# Patient Record
Sex: Female | Born: 1980 | Race: White | Hispanic: No | Marital: Married | Smoking: Never smoker
Health system: Southern US, Community
[De-identification: ages and names within clinical notes are randomized; demographics above are authoritative.]

## PROBLEM LIST (undated history)

## (undated) DIAGNOSIS — K589 Irritable bowel syndrome without diarrhea: Secondary | ICD-10-CM

## (undated) DIAGNOSIS — N83209 Unspecified ovarian cyst, unspecified side: Secondary | ICD-10-CM

## (undated) DIAGNOSIS — R000 Tachycardia, unspecified: Secondary | ICD-10-CM

## (undated) DIAGNOSIS — K219 Gastro-esophageal reflux disease without esophagitis: Secondary | ICD-10-CM

## (undated) DIAGNOSIS — R0989 Other specified symptoms and signs involving the circulatory and respiratory systems: Secondary | ICD-10-CM

## (undated) DIAGNOSIS — R519 Headache, unspecified: Secondary | ICD-10-CM

## (undated) DIAGNOSIS — J302 Other seasonal allergic rhinitis: Secondary | ICD-10-CM

## (undated) DIAGNOSIS — J45909 Unspecified asthma, uncomplicated: Secondary | ICD-10-CM

## (undated) DIAGNOSIS — F419 Anxiety disorder, unspecified: Secondary | ICD-10-CM

## (undated) DIAGNOSIS — R112 Nausea with vomiting, unspecified: Secondary | ICD-10-CM

## (undated) DIAGNOSIS — R51 Headache: Secondary | ICD-10-CM

## (undated) DIAGNOSIS — F32A Depression, unspecified: Secondary | ICD-10-CM

## (undated) DIAGNOSIS — Z9889 Other specified postprocedural states: Secondary | ICD-10-CM

## (undated) DIAGNOSIS — K5900 Constipation, unspecified: Secondary | ICD-10-CM

## (undated) HISTORY — DX: Other specified symptoms and signs involving the circulatory and respiratory systems: R09.89

## (undated) HISTORY — DX: Constipation, unspecified: K59.00

## (undated) HISTORY — DX: Other seasonal allergic rhinitis: J30.2

## (undated) HISTORY — DX: Tachycardia, unspecified: R00.0

## (undated) HISTORY — DX: Headache: R51

## (undated) HISTORY — DX: Irritable bowel syndrome, unspecified: K58.9

## (undated) HISTORY — DX: Depression, unspecified: F32.A

## (undated) HISTORY — DX: Gastro-esophageal reflux disease without esophagitis: K21.9

## (undated) HISTORY — DX: Headache, unspecified: R51.9

---

## 2003-12-13 HISTORY — PX: DILATION AND CURETTAGE OF UTERUS: SHX78

## 2004-02-03 ENCOUNTER — Other Ambulatory Visit: Admission: RE | Admit: 2004-02-03 | Discharge: 2004-02-03 | Payer: Self-pay | Admitting: *Deleted

## 2004-06-09 ENCOUNTER — Ambulatory Visit (HOSPITAL_COMMUNITY): Admission: RE | Admit: 2004-06-09 | Discharge: 2004-06-09 | Payer: Self-pay | Admitting: Obstetrics and Gynecology

## 2004-06-29 ENCOUNTER — Observation Stay (HOSPITAL_COMMUNITY): Admission: AD | Admit: 2004-06-29 | Discharge: 2004-06-30 | Payer: Self-pay | Admitting: *Deleted

## 2004-09-05 ENCOUNTER — Inpatient Hospital Stay (HOSPITAL_COMMUNITY): Admission: AD | Admit: 2004-09-05 | Discharge: 2004-09-05 | Payer: Self-pay | Admitting: Obstetrics and Gynecology

## 2004-09-07 ENCOUNTER — Inpatient Hospital Stay (HOSPITAL_COMMUNITY): Admission: AD | Admit: 2004-09-07 | Discharge: 2004-09-10 | Payer: Self-pay | Admitting: Obstetrics and Gynecology

## 2004-11-03 ENCOUNTER — Other Ambulatory Visit: Admission: RE | Admit: 2004-11-03 | Discharge: 2004-11-03 | Payer: Self-pay | Admitting: Obstetrics & Gynecology

## 2006-09-13 ENCOUNTER — Ambulatory Visit (HOSPITAL_COMMUNITY): Admission: AD | Admit: 2006-09-13 | Discharge: 2006-09-13 | Payer: Self-pay | Admitting: Obstetrics & Gynecology

## 2006-09-13 ENCOUNTER — Encounter (INDEPENDENT_AMBULATORY_CARE_PROVIDER_SITE_OTHER): Payer: Self-pay | Admitting: Specialist

## 2010-04-19 ENCOUNTER — Inpatient Hospital Stay (HOSPITAL_COMMUNITY): Admission: AD | Admit: 2010-04-19 | Discharge: 2010-04-22 | Payer: Self-pay | Admitting: Obstetrics & Gynecology

## 2011-03-01 LAB — CBC
HCT: 29.6 % — ABNORMAL LOW (ref 36.0–46.0)
HCT: 34.9 % — ABNORMAL LOW (ref 36.0–46.0)
MCHC: 35.4 g/dL (ref 30.0–36.0)
MCHC: 35.9 g/dL (ref 30.0–36.0)
MCV: 92.6 fL (ref 78.0–100.0)
MCV: 93.5 fL (ref 78.0–100.0)
Platelets: 182 10*3/uL (ref 150–400)
Platelets: 220 10*3/uL (ref 150–400)
RDW: 14.1 % (ref 11.5–15.5)
WBC: 11.7 10*3/uL — ABNORMAL HIGH (ref 4.0–10.5)
WBC: 17.3 10*3/uL — ABNORMAL HIGH (ref 4.0–10.5)

## 2011-03-01 LAB — RH IMMUNE GLOB WKUP(>/=20WKS)(NOT WOMEN'S HOSP)

## 2011-03-01 LAB — RPR: RPR Ser Ql: NONREACTIVE

## 2011-04-29 NOTE — Discharge Summary (Signed)
NAME:  Danielle [name], Danielle [name]                         ACCOUNT NO.:  [id number]   MEDICAL RECORD NO.:  [id number]                   PATIENT TYPE:  INP   LOCATION:  9164                                 FACILITY:  [facility]   PHYSICIAN:  Juluis Mire, M.D.                DATE OF BIRTH:  [birth year]   DATE OF ADMISSION:  06/28/2004  DATE OF DISCHARGE:  06/29/2004                                 DISCHARGE SUMMARY   ADMISSION DIAGNOSIS:  Intrauterine pregnancy at 31-6/7 weeks with nausea,  vomiting, and abdominal pain.   DISCHARGE DIAGNOSIS:  Intrauterine pregnancy at 31-6/7 weeks with nausea,  vomiting, and abdominal pain with probable muscular strain.   HISTORY OF PRESENT ILLNESS:  For a complete history and physical, please see  written note.   HOSPITAL COURSE:  The patient was brought into the hospital.  Laboratories  were checked.  Her PIH panel was within normal limits.  She had normal liver  function tests.  Platelet count was 250,000, white blood cell count was  12,500, hemoglobin was 10.9.  Urinalysis was clear except for ketones.  She  had negative nitrites, negative leukocytes, and negative blood on the UA.  She was begun on IV hydration.  It was noted that she had not had a bowel  movement for a while.  Fleets enema was given with good results.  The  following day, an ultrasound was obtained.  She had no evidence of  gallstones.  The infant had a biophysical profile of 8/8.  There was no  evidence of renal stones.  Fetal growth pattern was in the 50 to 75th  percentile.  She was feeling much better that day.  She was afebrile with  stable vital signs.  Abdomen was soft.  It had some subumbilical tenderness,  worse when she tightened the muscles and she raised her head.  Her bowel  sounds were active.  Gravid uterus, consistent with dates.  Fetal heart  tones were 140s to 150s, reactive.  Cervix was long and closed.  It was felt  that she might of had a muscular strain with the nausea and  vomiting causing  the subumbilical pain, particularly since it was worse when she raises her  head.   In terms of complications, none were encountered during her stay in the  hospital.  The patient was discharged home in stable condition.   DISPOSITION:  Continue watching for increasing pain or signs of change at  home.   FOLLOWUP:  She will follow up in the office on Friday.  Again, call with any  increasing nausea or vomiting, abdominal pain, or signs of any pre-term  uterine activity.  Juluis Mire, M.D.    JSM/MEDQ  D:  06/30/2004  T:  06/30/2004  Job:  [id number]

## 2011-04-29 NOTE — Op Note (Signed)
[name], [name]                   ACCOUNT NO.:  [id number]   MEDICAL RECORD NO.:  [id number]          PATIENT TYPE:  AMB   LOCATION:  SDC                           [facility]ILITY:  [facility]   PHYSICIAN:  Freddy Finner, M.D.   DATE OF BIRTH:  1981/06/15   DATE OF PROCEDURE:  09/13/2006  DATE OF DISCHARGE:                                 OPERATIVE REPORT   PREOPERATIVE DIAGNOSIS:  Blighted ovum with missed abortion.   POSTOPERATIVE DIAGNOSIS:  Blighted ovum with missed abortion.   OPERATION:  Dilatation and evacuation.   SURGEON:  Freddy Finner, M.D.   ANESTHESIA:  IV sedation and paracervical block.   ESTIMATED INTRAOPERATIVE BLOOD LOSS:  50 cc.   INTRAOPERATIVE COMPLICATIONS:  None.   Patient is a 30 year old married white female, gravida 2, para 1, who  presented for evaluation on September 11, 2006 with a history of vaginal  bleeding and estimated gestational age of [redacted] weeks.  The sonogram showed a 6  week, 2 day size sac with no fetal pole.  Options were discussed with the  patient, who elected to follow serial HCGs with planned D&E on the October  4th.  She presented to the office today for preoperative evaluation  complaining of passage of dime-sized clots vaginally and some increasing  cramping and red bleeding.  Quantitative HCG had dramatically decreased.  Based on the findings and the onset of heavier bleeding, it was elected to  proceed with surgery today.  She was admitted on the afternoon of surgery.   She was brought to the operating room, there placed under IV sedation.  Placed in the dorsal lithotomy position using Allen stirrups system.  Betadine prep of the mons, perineum, and vagina was carried out in a  standard fashion.  A bivalve speculum was introduced.  The cervix was  visualized, grasped with a single-tooth tenaculum over the anterior cervical  lip.  Paracervical block was then placed with injections at 4 and 8 o'clock  in the vaginal fornices using 1% plain  Xylocaine.  The cervix was easily  dilated to 25 with [facility] dilators.  A 7 mm suction cannula was introduced.  Aspiration produced obvious products of conception.  This was continued  until it was felt the cavity was completely evacuated.  Gentle sharp  curettage was carried out, followed by repeat vacuum aspiration to confirm  complete evacuation of the products of conception.  Procedure at this point  was terminated.  The patient was given  Toradol 30 mg IV and 30 mg IM.  She was taken to recovery in good condition.  She will be given routine outpatient surgical instructions.  She is to  receive RhoGAM in the recovery room.  She is to return to the office in  approximately one week for followup.  She is instructed to use Tylenol or  Advil as needed for postoperative pain.      Freddy Finner, M.D.  Electronically Signed     WRN/MEDQ  D:  09/13/2006  T:  09/13/2006  Job:  [id number]

## 2012-09-18 ENCOUNTER — Encounter: Payer: Self-pay | Admitting: Cardiovascular Disease

## 2012-10-01 ENCOUNTER — Encounter (HOSPITAL_COMMUNITY): Payer: Self-pay | Admitting: *Deleted

## 2012-10-01 ENCOUNTER — Emergency Department (HOSPITAL_COMMUNITY)
Admission: EM | Admit: 2012-10-01 | Discharge: 2012-10-02 | Disposition: A | Discharge status: Home / Self Care | Payer: BC Managed Care – PPO | Attending: Emergency Medicine | Admitting: Emergency Medicine

## 2012-10-01 DIAGNOSIS — R109 Unspecified abdominal pain: Secondary | ICD-10-CM | POA: Insufficient documentation

## 2012-10-01 DIAGNOSIS — N83209 Unspecified ovarian cyst, unspecified side: Secondary | ICD-10-CM | POA: Insufficient documentation

## 2012-10-01 DIAGNOSIS — R110 Nausea: Secondary | ICD-10-CM | POA: Insufficient documentation

## 2012-10-01 DIAGNOSIS — D72829 Elevated white blood cell count, unspecified: Secondary | ICD-10-CM | POA: Insufficient documentation

## 2012-10-01 DIAGNOSIS — J45909 Unspecified asthma, uncomplicated: Secondary | ICD-10-CM | POA: Insufficient documentation

## 2012-10-01 HISTORY — DX: Unspecified asthma, uncomplicated: J45.909

## 2012-10-01 HISTORY — DX: Unspecified ovarian cyst, unspecified side: N83.209

## 2012-10-01 NOTE — ED Notes (Addendum)
Pt c/o of lower abd pain that comes "in waves" since 1830 tonight.  She has a hx of ovarian cyst rupture and this feels the same.  Denies vag discharge or urinary s/s.  Pt experiences nausea with the pain.  Pt also c/o very irregular periods.

## 2012-10-02 ENCOUNTER — Emergency Department (HOSPITAL_COMMUNITY): Payer: BC Managed Care – PPO

## 2012-10-02 LAB — COMPREHENSIVE METABOLIC PANEL
ALT: 10 U/L (ref 0–35)
AST: 17 U/L (ref 0–37)
Albumin: 4.2 g/dL (ref 3.5–5.2)
Alkaline Phosphatase: 66 U/L (ref 39–117)
Calcium: 9.8 mg/dL (ref 8.4–10.5)
Chloride: 102 mEq/L (ref 96–112)
GFR calc non Af Amer: >90 mL/min (ref >90)
Sodium: 139 mEq/L (ref 135–145)
Total Bilirubin: 0.5 mg/dL (ref 0.3–1.2)
Total Protein: 7.7 g/dL (ref 6.0–8.3)

## 2012-10-02 LAB — URINALYSIS, ROUTINE W REFLEX MICROSCOPIC
Bilirubin Urine: NEGATIVE
Ketones, ur: NEGATIVE mg/dL
Nitrite: NEGATIVE
Protein, ur: NEGATIVE mg/dL
Specific Gravity, Urine: 1.013 (ref 1.005–1.030)
pH: 8.5 — ABNORMAL HIGH (ref 5.0–8.0)

## 2012-10-02 LAB — CBC
HCT: 38.5 % (ref 36.0–46.0)
RBC: 4.51 MIL/uL (ref 3.87–5.11)

## 2012-10-02 LAB — PREGNANCY, URINE: Preg Test, Ur: NEGATIVE

## 2012-10-02 MED ORDER — ONDANSETRON HCL 4 MG/2ML IJ SOLN
4.0000 mg | Freq: Once | INTRAMUSCULAR | Status: AC
  Administered 2012-10-02: 4 mg via INTRAVENOUS
  Filled 2012-10-02: qty 2

## 2012-10-02 MED ORDER — MORPHINE SULFATE 4 MG/ML IJ SOLN
4.0000 mg | Freq: Once | INTRAMUSCULAR | Status: AC
  Administered 2012-10-02: 4 mg via INTRAVENOUS
  Filled 2012-10-02: qty 1

## 2012-10-02 MED ORDER — HYDROCODONE-ACETAMINOPHEN 5-500 MG PO TABS: 1.0000 | ORAL_TABLET | Freq: Four times a day (QID) | ORAL | Status: DC | PRN

## 2012-10-02 MED ORDER — SODIUM CHLORIDE 0.9 % IV BOLUS (SEPSIS)
1000.0000 mL | Freq: Once | INTRAVENOUS | Status: AC
  Administered 2012-10-02: 1000 mL via INTRAVENOUS

## 2012-10-02 MED ORDER — CIPROFLOXACIN HCL 500 MG PO TABS: 500.0000 mg | ORAL_TABLET | Freq: Two times a day (BID) | ORAL | Status: DC

## 2012-10-02 NOTE — ED Provider Notes (Addendum)
History     CSN: [id number]  Arrival date & time 10/01/12  2347   First MD Initiated Contact with Patient 10/01/12 2353      Chief Complaint  Patient presents with  . Abdominal Pain    (Consider location/radiation/quality/duration/timing/severity/associated sxs/prior treatment) Patient is a 31 y.o. female presenting with abdominal pain. The history is provided by the patient.  Abdominal Pain The primary symptoms of the illness include abdominal pain. The current episode started 1 to 2 hours ago. The onset of the illness was sudden. The problem has not changed since onset. The abdominal pain began less than 1 hour ago. The pain came on suddenly. The abdominal pain has been rapidly improving since its onset. The abdominal pain is located in the suprapubic region. The severity of the abdominal pain is 3/10. The abdominal pain is relieved by nothing. The abdominal pain is exacerbated by vomiting.  The patient states that she believes she is currently not pregnant. The patient has not had a change in bowel habit. Symptoms associated with the illness do not include chills or anorexia.    Past Medical History  Diagnosis Date  . Other and unspecified ovarian cysts   . Asthma     Past Surgical History  Procedure Date  . Dilation and curettage of uterus 2005    No family history on file.  History  Substance Use Topics  . Smoking status: Never Smoker   . Smokeless tobacco: Not on file  . Alcohol Use: Yes     occasionally    OB History    Grav Para Term Preterm Abortions TAB SAB Ect Mult Living                  Review of Systems  Constitutional: Negative for chills.  Gastrointestinal: Positive for abdominal pain. Negative for anorexia.  All other systems reviewed and are negative.    Allergies  Review of patient's allergies indicates no known allergies.  Home Medications   Current Outpatient Rx  Name Route Sig Dispense Refill  . ALBUTEROL SULFATE HFA 108 (90 BASE)  MCG/ACT IN AERS Inhalation Inhale 2 puffs into the lungs every 6 (six) hours as needed. For wheeze or shortness of breath      BP 143/103  Pulse 76  Temp 98.6 F (37 C) (Oral)  Resp 18  SpO2 99%  LMP 09/18/2012  Physical Exam  Constitutional: She is oriented to person, place, and time. She appears well-developed and well-nourished.  HENT:  Head: Normocephalic and atraumatic.  Eyes: Conjunctivae normal and EOM are normal. Pupils are equal, round, and reactive to light.  Neck: Normal range of motion.  Cardiovascular: Normal rate, regular rhythm and normal heart sounds.   Pulmonary/Chest: Effort normal and breath sounds normal.  Abdominal: Soft. Bowel sounds are normal. There is tenderness.    Musculoskeletal: Normal range of motion.  Neurological: She is alert and oriented to person, place, and time.  Skin: Skin is warm and dry.  Psychiatric: She has a normal mood and affect. Her behavior is normal.    ED Course  Procedures (including critical care time)  Labs Reviewed - No data to display No results found.   No diagnosis found.    MDM  + lower abd pain.  States hx of ovarian cyst.  Will [place],  reassess  [place] neg except for small corpus luteal cyst.  Abd pain resolved on reexam.  Pt improved.  Discussed other possible etiology of pain with pt.  Declines ct  at this time.  Minimal leukocytosis.  No rlq pain.  Will dc with analgesia, abx, ret new/worsening sxs       Giovonnie Trettel Lytle Michaels, MD 10/02/12 0008  Denissa Cozart Lytle Michaels, MD 10/02/12 4540  Knox Royalty Lytle Michaels, MD 10/02/12 9811

## 2012-10-11 ENCOUNTER — Encounter: Payer: Self-pay | Admitting: Cardiovascular Disease

## 2012-10-11 ENCOUNTER — Ambulatory Visit (INDEPENDENT_AMBULATORY_CARE_PROVIDER_SITE_OTHER): Payer: BC Managed Care – PPO | Admitting: Cardiovascular Disease

## 2012-10-11 VITALS — BP 117/70 | HR 68 | Ht 64.0 in | Wt 140.0 lb

## 2012-10-11 DIAGNOSIS — R002 Palpitations: Secondary | ICD-10-CM

## 2012-10-11 NOTE — Patient Instructions (Addendum)

## 2012-10-11 NOTE — Progress Notes (Signed)
   History of Present Illness: 31 yo WF with history of asthma, palpitations, vasovagal syncope who is here today for evaluation of palpitations. She has had some episodes of syncope but his is mostly related to needle sticks and other events. No syncope during episodes of palpitations. Palpitations last for several minutes. This occurs every few months. Going on for years. Works in [employer] as Engineer, civil (consulting). HR was 130 one time but she did not get an EKG. No chest pain or SOB.   Primary Care Physician:  Zelphia Cairo, MD (OB/Gyn)   Past Medical History  Diagnosis Date  . Other and unspecified ovarian cysts   . Asthma   . Rapid heart rate   . Pulse irregularity   . Generalized headaches   . Seasonal allergies   . Constipation     Past Surgical History  Procedure Date  . Dilation and curettage of uterus 2005    Current Outpatient Prescriptions  Medication Sig Dispense Refill  . albuterol (PROVENTIL HFA;VENTOLIN HFA) 108 (90 BASE) MCG/ACT inhaler Inhale 2 puffs into the lungs every 6 (six) hours as needed. For wheeze or shortness of breath      . ciprofloxacin (CIPRO) 500 MG tablet Take 1 tablet (500 mg total) by mouth every 12 (twelve) hours.  10 tablet  0  . HYDROcodone-acetaminophen (VICODIN) 5-500 MG per tablet Take 1-2 tablets by mouth every 6 (six) hours as needed for pain.  15 tablet  0    No Known Allergies  History   Social History  . Marital Status: Married    Spouse Name: N/A    Number of Children: 2  . Years of Education: N/A   Occupational History  . 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 Smoking status: Never Smoker   . Smokeless tobacco: Not on file  . Alcohol Use: 0.5 oz/week    1 drink(s) per week     occasionally  . Drug Use: No  . Sexually Active: Not on file   Other Topics Concern  . Not on file   Social History Narrative  . No narrative on file    Family History  Problem Relation Age of Onset  . CAD Neg Hx   .  Hypertension Mother   . Hypertension Father     Review of Systems:  As stated in the HPI and otherwise negative.   BP 117/70  Pulse 68  Ht 5\' 4"  (1.626 m)  Wt 140 lb (63.504 kg)  BMI 24.03 kg/m2  LMP 09/18/2012  Physical Examination: General: Well developed, well nourished, NAD HEENT: OP clear, mucus membranes moist SKIN: warm, dry. No rashes. Neuro: No focal deficits Musculoskeletal: Muscle strength 5/5 all ext Psychiatric: Mood and affect normal Neck: No JVD, no carotid bruits, no thyromegaly, no lymphadenopathy. Lungs:Clear bilaterally, no wheezes, rhonci, crackles Cardiovascular: Regular rate and rhythm. No murmurs, gallops or rubs. Abdomen:Soft. Bowel sounds present. Non-tender.  Extremities: No lower extremity edema. Pulses are 2 + in the bilateral DP/PT.  EKG: NSR with sinus arrythmia. Rate 68 bpm.   Assessment and Plan:   1. Palpitations: Rare. Likely premature beats or SVT. They do not occur frequently, maybe 1 time every 2 months. Will arrange echo to exclude structural heart disease. I will not arrange monitor since her symptoms are sporadic. She is asked to avoid stimulants such as caffeine/nicotine/OTC meds with stimulant additives.

## 2012-10-24 ENCOUNTER — Other Ambulatory Visit (HOSPITAL_COMMUNITY): Payer: BC Managed Care – PPO

## 2012-12-19 ENCOUNTER — Ambulatory Visit (HOSPITAL_COMMUNITY): Payer: BC Managed Care – PPO | Attending: Cardiovascular Disease | Admitting: Radiology

## 2012-12-19 ENCOUNTER — Other Ambulatory Visit (HOSPITAL_COMMUNITY): Payer: BC Managed Care – PPO

## 2012-12-19 DIAGNOSIS — R002 Palpitations: Secondary | ICD-10-CM

## 2012-12-19 DIAGNOSIS — R55 Syncope and collapse: Secondary | ICD-10-CM | POA: Insufficient documentation

## 2012-12-19 NOTE — Progress Notes (Signed)
Echocardiogram performed.  

## 2014-07-17 ENCOUNTER — Other Ambulatory Visit: Payer: Self-pay | Admitting: Obstetrics and Gynecology

## 2014-07-17 DIAGNOSIS — N644 Mastodynia: Secondary | ICD-10-CM

## 2014-07-23 ENCOUNTER — Ambulatory Visit
Admission: RE | Admit: 2014-07-23 | Discharge: 2014-07-23 | Disposition: A | Discharge status: Home / Self Care | Payer: BC Managed Care – PPO | Source: Ambulatory Visit | Attending: Obstetrics and Gynecology | Admitting: Obstetrics and Gynecology

## 2014-07-23 ENCOUNTER — Encounter (INDEPENDENT_AMBULATORY_CARE_PROVIDER_SITE_OTHER): Payer: Self-pay

## 2014-07-23 DIAGNOSIS — N644 Mastodynia: Secondary | ICD-10-CM

## 2014-10-13 ENCOUNTER — Other Ambulatory Visit: Payer: Self-pay | Admitting: Obstetrics and Gynecology

## 2014-10-14 LAB — CYTOLOGY - PAP

## 2016-04-15 ENCOUNTER — Ambulatory Visit (INDEPENDENT_AMBULATORY_CARE_PROVIDER_SITE_OTHER): Payer: BLUE CROSS/BLUE SHIELD | Admitting: Family Medicine

## 2016-04-15 ENCOUNTER — Encounter: Payer: Self-pay | Admitting: Family Medicine

## 2016-04-15 VITALS — BP 124/82 | HR 88 | Temp 98.5°F | Resp 16 | Ht 64.0 in | Wt 152.0 lb

## 2016-04-15 DIAGNOSIS — F411 Generalized anxiety disorder: Secondary | ICD-10-CM | POA: Diagnosis not present

## 2016-04-15 DIAGNOSIS — F4001 Agoraphobia with panic disorder: Secondary | ICD-10-CM

## 2016-04-15 DIAGNOSIS — J309 Allergic rhinitis, unspecified: Secondary | ICD-10-CM | POA: Diagnosis not present

## 2016-04-15 DIAGNOSIS — H1032 Unspecified acute conjunctivitis, left eye: Secondary | ICD-10-CM | POA: Diagnosis not present

## 2016-04-15 MED ORDER — MONTELUKAST SODIUM 10 MG PO TABS: 10.0000 mg | ORAL_TABLET | Freq: Every day | ORAL | Status: DC

## 2016-04-15 MED ORDER — ESCITALOPRAM OXALATE 20 MG PO TABS: 20.0000 mg | ORAL_TABLET | Freq: Every day | ORAL | Status: DC

## 2016-04-15 MED ORDER — OFLOXACIN 0.3 % OP SOLN: 1.0000 [drp] | Freq: Four times a day (QID) | OPHTHALMIC | Status: AC

## 2016-04-15 NOTE — Patient Instructions (Signed)
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  2. Generalized anxiety disorder Well controlled. - escitalopram (LEXAPRO) 20 MG tablet; Take 1 tablet (20 mg total) by mouth daily.  Dispense: 90 tablet; Refill: 2  3. Acute conjunctivitis, left eye abx x 7 days - ofloxacin (OCUFLOX) 0.3 % ophthalmic solution; Place 1 drop into the left eye 4 (four) times daily.  Dispense: 5 mL; Refill: 0  4. Allergic rhinitis, unspecified allergic rhinitis type Stopped Flonase.  - montelukast (SINGULAIR) 10 MG tablet; Take 1 tablet (10 mg total) by mouth at bedtime.  Dispense: 30 tablet; Refill: 3      If you sign-up for My chart, you can communicate easier with [place] in case you have any question or concern.

## 2016-04-15 NOTE — Progress Notes (Signed)
HPI:  [name] is a 35 y.o.female , who is here today to establish care with [place] her, I have seen her while I was with [facility] and she decided to follow me here.  She follows with gyn for her routine female preventive care.  Hx of anxiety,allergic rhinitis, and asthma.  Has the following chronic problems that require follow up and concerns today:   She has history of anxiety disorder and agoraphobia, the former one she has had for years, mild and untreated.She developed a sudden episode of panic attack in December 2015, since then she has been on clonazepam. Currently he is on Lexapro 20 mg , which was started in 01/2016, she was on Fluoxetine and Sertraline before. She is still on Clonazepam as needed, mainly when she travels.  She is also following psychotherapist every month, and had an appointment already scheduled with psychiatrists. In general she feels like her anxiety is better controlled, she has been able to travel with no major symptoms of agoraphobia. She has been on the beach when she didn't need to take her clonazepam as she did before. She also travelled to [place] the beginning of Spring and did "really well."  She denies any depressed mood and suicidal thoughts.  Left eye conjunctivitis: Today she is complaining of leg conjunctivae erythema and pruritus, today when she woke up she had yellowish discharge. She denies any eye pain or visual changes, headache, nausea, vomiting. She had left eye drops from last year, son's Rx, wonders if she can use it.  She has history of allergic rhinitis, currently she is on Zyrtec 10 mg daily and Flonase nasal spray, she has noted some nasal bleeding. She has not had any fever or chills. No sick contact. She has history of asthma but has not had any cough or wheezing.    REVIEW OF SYSTEMS:  General: Negative for fever, fatigue, abnormal weight loss, or changes in appetite.  Eyes: + conjunctival erythema and  pruritus + discharge. No visual changes.  ENT: Negative for earache, hearing loss, or ear drainage. + rhinorrhea, nasal congestion, nose pruritus, and mild epistaxis.  No sinus pain,  Negative for oral lesions, odynophagia, dysphagia.  Neck: negative for swollen glands or masses.  Cardiac: Negative for chest pain, exertional dyspnea, irregular HR, claudication, cold extremities, or edema.  Respiratory: Negative for cough, dyspnea, wheezing, or hemoptysis.  Abdomen: Negative for abdominal pain, changes in bowel habits, blood in stool or melena, nausea, or vomiting.  GU: Negative for dysuria, urinary frequency, urinary urgency, gross hematuria, urine incontinence.  MS: No arthralgias, joint erythema or edema, joint stiffness, or limitation of ROM.  Neurologic: No confusion,  focal weakness, numbness or tingling, frequent/severe headaches, or tremor.  Psychiatric: No depression or sleep disorder. + Anxiety, better controlled with medication. Denies suicidal thoughts or hallucinations.  Skin: Negative for rash, ulcers, or skin color changes.    Past Medical History  Diagnosis Date  . Other and unspecified ovarian cysts   . Asthma   . Rapid heart rate   . Pulse irregularity   . Generalized headaches   . Seasonal allergies   . Constipation     Past Surgical History  Procedure Laterality Date  . Dilation and curettage of uterus  2005    Family History  Problem Relation Age of Onset  . CAD Neg Hx   . Hypertension Mother   . Hypertension Father     Social History   Social History  .  Marital Status: Married    Spouse Name: N/A  . Number of Children: 2  . Years of Education: N/A   Occupational History  . 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 Smoking status: Never Smoker   . Smokeless tobacco: None  . Alcohol Use: 0.5 oz/week    1 drink(s) per week     Comment: occasionally  . Drug Use: No  . Sexual Activity: Not Asked   Other Topics  Concern  . None   Social History Narrative    Current Outpatient Prescriptions on File Prior to Visit  Medication Sig Dispense Refill  . albuterol (PROVENTIL HFA;VENTOLIN HFA) 108 (90 BASE) MCG/ACT inhaler Inhale 2 puffs into the lungs every 6 (six) hours as needed. For wheeze or shortness of breath     No current facility-administered medications on file prior to visit.     EXAM:  Filed Vitals:   04/15/16 0959  BP: 124/82  Pulse: 88  Temp: 98.5 F (36.9 C)  Resp: 16   SpO2 Readings from Last 3 Encounters:  04/15/16 98%  10/02/12 98%     Body mass index is 26.08 kg/(m^2).    GENERAL: vitals reviewed and listed above. Well developed and nourished, appears well hydrated and in no acute distress.  HEAD: atraumatic.  EYE: right conjunttiva clear, left conjunctival erythema tarsal > bulbar, no discharge, + epiphora, PERRLA and EOMI bilateral.   [id number] mucosa most, no oral lesions, no oropharyngeal erythema, + post nasal drainage. Mild deviated septum, no sinus tenderness, mild hypertrophic turbinated, no blood appreciated, left septum/nostril dry/crusty. No obvious abnormalities on inspection of external nose and ears.  NECK: no obvious masses on inspection. No lymphadenopathy.  LUNGS: clear to auscultation bilaterally, no wheezes, rales or rhonchi, good air movement  CV: Regular rate and rhythm, no murmurs appreciated, no peripheral edema.  ABDOMEN: Soft, no tender, no masses or hepatomegaly appreciated.  MS: moves all extremities without noticeable abnormality.  NEURO: Alert and oriented x 3, no focal deficit appreciated, normal gait.  PSYCH: pleasant and cooperative, no obvious depression or anxiety. Well groomed and good eye contact.    ASSESSMENT AND PLAN:  Discussed the following assessment and plan:   Agoraphobia with panic attacks Stable. Continue following with psychotherapy. No changes in current management: Clonazepan prn and Lexapro. F/U in  6 months.   Generalized anxiety disorder  Well control with Lexapro, no changes today.  She will keep appointment with psychiatrist, she arranged this appointment in 02/2016. She would like to keep the appointment but thinking about continuing following with me. She is very glad about symptoms improvement with Lexapro. She will continue following with psychotherapy. She can follow up in 5-6 months here, before if needed.  - Plan: escitalopram (LEXAPRO) 20 MG tablet   Acute conjunctivitis, left eye   Possible causes discussed, certainly can be allergy, but I am treating as bacterial conjunctivitis given the sudden onset + history and clinical findings.  Instructed about warning signs. Follow-up as needed.   - Plan: ofloxacin (OCUFLOX) 0.3 % ophthalmic solution   Allergic rhinitis, unspecified allergic rhinitis type  Still symptomatic, epistaxis can be caused by rhinitis and Flonase, she will discontinue Flonase nasal spray and and Singulair added. She will continue Zyrtec. Nasal saline drops and steam inhalations might also help. Follow-up in 6 months.  - Plan: montelukast (SINGULAIR) 10 MG tablet    -We reviewed the PMH, PSH, FH, SH, Meds and Allergies. -We provided refills for  any medications we will prescribe as needed. -We addressed current concerns per orders and patient instructions.     -Patient advised to return or notify a doctor immediately if symptoms worsen or persist or new concerns arise.      Betty G. Martinique, MD  [facility]. [place] office.

## 2016-05-13 ENCOUNTER — Ambulatory Visit (INDEPENDENT_AMBULATORY_CARE_PROVIDER_SITE_OTHER): Payer: BLUE CROSS/BLUE SHIELD | Admitting: Emergency Medicine

## 2016-05-13 ENCOUNTER — Ambulatory Visit (INDEPENDENT_AMBULATORY_CARE_PROVIDER_SITE_OTHER): Payer: BLUE CROSS/BLUE SHIELD

## 2016-05-13 VITALS — BP 118/78 | HR 91 | Temp 98.3°F | Resp 15 | Ht 64.0 in | Wt 155.0 lb

## 2016-05-13 DIAGNOSIS — J301 Allergic rhinitis due to pollen: Secondary | ICD-10-CM

## 2016-05-13 DIAGNOSIS — Z1388 Encounter for screening for disorder due to exposure to contaminants: Secondary | ICD-10-CM

## 2016-05-13 DIAGNOSIS — J4531 Mild persistent asthma with (acute) exacerbation: Secondary | ICD-10-CM | POA: Diagnosis not present

## 2016-05-13 DIAGNOSIS — M25571 Pain in right ankle and joints of right foot: Secondary | ICD-10-CM

## 2016-05-13 DIAGNOSIS — Z3202 Encounter for pregnancy test, result negative: Secondary | ICD-10-CM

## 2016-05-13 DIAGNOSIS — M8430XA Stress fracture, unspecified site, initial encounter for fracture: Secondary | ICD-10-CM

## 2016-05-13 DIAGNOSIS — J45901 Unspecified asthma with (acute) exacerbation: Secondary | ICD-10-CM | POA: Insufficient documentation

## 2016-05-13 DIAGNOSIS — R062 Wheezing: Secondary | ICD-10-CM | POA: Diagnosis not present

## 2016-05-13 DIAGNOSIS — J309 Allergic rhinitis, unspecified: Secondary | ICD-10-CM | POA: Insufficient documentation

## 2016-05-13 DIAGNOSIS — J4520 Mild intermittent asthma, uncomplicated: Secondary | ICD-10-CM | POA: Insufficient documentation

## 2016-05-13 LAB — POCT URINE PREGNANCY: PREG TEST UR: NEGATIVE

## 2016-05-13 MED ORDER — BECLOMETHASONE DIPROPIONATE 80 MCG/ACT IN AERS: 2.0000 | INHALATION_SPRAY | Freq: Two times a day (BID) | RESPIRATORY_TRACT | Status: DC

## 2016-05-13 MED ORDER — ALBUTEROL SULFATE (2.5 MG/3ML) 0.083% IN NEBU
2.5000 mg | INHALATION_SOLUTION | Freq: Once | RESPIRATORY_TRACT | Status: AC
  Administered 2016-05-13: 2.5 mg via RESPIRATORY_TRACT

## 2016-05-13 NOTE — Patient Instructions (Addendum)
IF you received an x-ray today, you will receive an invoice from [facility]. Please contact [facility] at [phone] with questions or concerns regarding your invoice.   IF you received labwork today, you will receive an invoice from [employer]. Please contact [facility] at [phone] with questions or concerns regarding your invoice.   Our billing staff will not be able to assist you with questions regarding bills from these companies.  You will be contacted with the lab results as soon as they are available. The fastest way to get your results is to activate your My Chart account. Instructions are located on the last page of this paperwork. If you have not heard from [place] regarding the results in 2 weeks, please contact this office.     Stress Fracture Stress fracture is a small break or crack in a bone. A stress fracture can be fully broken (complete) or partially broken (incomplete). The most common sites for stress fractures are the bones in the front of your feet (metatarsals), your heels (calcaneus), and the long bone of your lower leg (tibia). CAUSES A stress fracture is caused by overuse or repetitive exercise, such as running. It happens when a bone cannot absorb any more shock because the muscles around it are weak. Stress fractures happen most commonly when:  You rapidly increase or start a new physical activity.  You use shoes that are worn out or do not fit you properly.  You exercise on a new surface. RISK FACTORS You may be at higher risk for this type of fracture if:  You have a condition that causes weak bones (osteoporosis).  You are female. Stress fractures are more likely to occur in women. SIGNS AND SYMPTOMS The most common symptom of a stress fracture is feeling pain when you are using the affected part of your body. The pain usually goes away when you are resting. Other symptoms may include:  Swelling of the  affected area.  Pain in the area when it is touched.  Decreased pain while resting. Stress fracture pain usually develops over time. DIAGNOSIS Diagnosis may include:   Medical history and physical exam.  X-rays.  Bone scan.  MRI. TREATMENT Treatment depends on the severity of your stress fracture. Treatment usually involves resting, icing, compression, and elevation (RICE) of the affected part of your body. Treatment may also include:  Medicines to reduce inflammation.  A cast or a walking shoe.  Crutches.  Surgery. HOME CARE INSTRUCTIONS If You Have a Cast:  Do not stick anything inside the cast to scratch your skin. Doing that increases your risk of infection.  Check the skin around the cast every day. Report any concerns to your health care provider. You may put lotion on dry skin around the edges of the cast. Do not apply lotion to the skin underneath the cast.  Keep the cast clean and dry.  Cover the cast with a watertight plastic bag to protect it from water while you take a bath or a shower. Do not let the cast get wet.  Do not put pressure on any part of the cast until it is fully hardened. This may take several hours. If You Have a Walking Shoe:  Wear it as directed by your health care provider. Managing Pain, Stiffness, and Swelling  If directed, apply ice to the injured area:  Put ice in a plastic bag.  Place a towel between your skin and the bag.  Leave the  ice on for 20 minutes, 2-3 times per day.  Move your fingers or toes often to avoid stiffness and to lessen swelling.  Raise the injured area above the level of your heart while you are sitting or lying down. Activity  Rest as directed by your health care provider. Ask your health care provider if you may do alternative exercises, such as swimming or biking, while you are healing.  Return to your normal activities as directed by your health care provider. Ask your health care provider what  activities are safe for you.  Perform range-of-motion exercises only as directed by your health care provider. Safety  Do not use the injured limb to support yourbody weight until your health care provider says that you can. Use crutches if your health care provider tells you to do so. General Instructions  Do not use any tobacco products, including cigarettes, chewing tobacco, or electronic cigarettes. Tobacco can delay bone healing. If you need help quitting, ask your health care provider.  Take medicines only as directed by your health care provider.  Keep all follow-up visits as directed by your health care provider. This is important. PREVENTION  Only wear shoes that:  Fit well.  Are not worn out.  Eat a healthy diet that contains vitamin D and calcium. This helps keeps your bones strong.  Be careful when you start a new physical activity. Give your body time to adjust.  Avoid doing only one kind of activity. Do different exercises, such as swimming and running, so that no single part of your body gets overused.  Do strength-training exercises. SEEK MEDICAL CARE IF:  Your pain gets worse.  You have new symptoms.  You have increased swelling. SEEK IMMEDIATE MEDICAL CARE IF:   You lose feeling in the affected area.   This information is not intended to replace advice given to you by your health care provider. Make sure you discuss any questions you have with your health care provider.   Document Released: 02/18/2003 Document Revised: 12/19/2014 Document Reviewed: 07/03/2014 Elsevier Interactive Patient Education 2016 [employer]. Asthma, Adult Asthma is a recurring condition in which the airways tighten and narrow. Asthma can make it difficult to breathe. It can cause coughing, wheezing, and shortness of breath. Asthma episodes, also called asthma attacks, range from minor to life-threatening. Asthma cannot be cured, but medicines and lifestyle changes can help  control it. CAUSES Asthma is believed to be caused by inherited (genetic) and environmental factors, but its exact cause is unknown. Asthma may be triggered by allergens, lung infections, or irritants in the air. Asthma triggers are different for each person. Common triggers include:   Animal dander.  Dust mites.  Cockroaches.  Pollen from trees or grass.  Mold.  Smoke.  Air pollutants such as dust, household cleaners, hair sprays, aerosol sprays, paint fumes, strong chemicals, or strong odors.  Cold air, weather changes, and winds (which increase molds and pollens in the air).  Strong emotional expressions such as crying or laughing hard.  Stress.  Certain medicines (such as aspirin) or types of drugs (such as beta-blockers).  Sulfites in foods and drinks. Foods and drinks that may contain sulfites include dried fruit, potato chips, and sparkling grape juice.  Infections or inflammatory conditions such as the flu, a cold, or an inflammation of the nasal membranes (rhinitis).  Gastroesophageal reflux disease (GERD).  Exercise or strenuous activity. SYMPTOMS Symptoms may occur immediately after asthma is triggered or many hours later. Symptoms include:  Wheezing.  Excessive nighttime or early morning coughing.  Frequent or severe coughing with a common cold.  Chest tightness.  Shortness of breath. DIAGNOSIS  The diagnosis of asthma is made by a review of your medical history and a physical exam. Tests may also be performed. These may include:  Lung function studies. These tests show how much air you breathe in and out.  Allergy tests.  Imaging tests such as X-rays. TREATMENT  Asthma cannot be cured, but it can usually be controlled. Treatment involves identifying and avoiding your asthma triggers. It also involves medicines. There are 2 classes of medicine used for asthma treatment:   Controller medicines. These prevent asthma symptoms from occurring. They are  usually taken every day.  Reliever or rescue medicines. These quickly relieve asthma symptoms. They are used as needed and provide short-term relief. Your health care provider will help you create an asthma action plan. An asthma action plan is a written plan for managing and treating your asthma attacks. It includes a list of your asthma triggers and how they may be avoided. It also includes information on when medicines should be taken and when their dosage should be changed. An action plan may also involve the use of a device called a peak flow meter. A peak flow meter measures how well the lungs are working. It helps you monitor your condition. HOME CARE INSTRUCTIONS   Take medicines only as directed by your health care provider. Speak with your health care provider if you have questions about how or when to take the medicines.  Use a peak flow meter as directed by your health care provider. Record and keep track of readings.  Understand and use the action plan to help minimize or stop an asthma attack without needing to seek medical care.  Control your home environment in the following ways to help prevent asthma attacks:  Do not smoke. Avoid being exposed to secondhand smoke.  Change your heating and air conditioning filter regularly.  Limit your use of fireplaces and wood stoves.  Get rid of pests (such as roaches and mice) and their droppings.  Throw away plants if you see mold on them.  Clean your floors and dust regularly. Use unscented cleaning products.  Try to have someone else vacuum for you regularly. Stay out of rooms while they are being vacuumed and for a short while afterward. If you vacuum, use a dust mask from a hardware store, a double-layered or microfilter vacuum cleaner bag, or a vacuum cleaner with a HEPA filter.  Replace carpet with wood, tile, or vinyl flooring. Carpet can trap dander and dust.  Use allergy-proof pillows, mattress covers, and box spring  covers.  Wash bed sheets and blankets every week in hot water and dry them in a dryer.  Use blankets that are made of polyester or cotton.  Clean bathrooms and kitchens with bleach. If possible, have someone repaint the walls in these rooms with mold-resistant paint. Keep out of the rooms that are being cleaned and painted.  Wash hands frequently. SEEK MEDICAL CARE IF:   You have wheezing, shortness of breath, or a cough even if taking medicine to prevent attacks.  The colored mucus you cough up (sputum) is thicker than usual.  Your sputum changes from clear or white to yellow, green, gray, or bloody.  You have any problems that may be related to the medicines you are taking (such as a rash, itching, swelling, or trouble breathing).  You are using a reliever  medicine more than 2-3 times per week.  Your peak flow is still at 50-79% of your personal best after following your action plan for 1 hour.  You have a fever. SEEK IMMEDIATE MEDICAL CARE IF:   You seem to be getting worse and are unresponsive to treatment during an asthma attack.  You are short of breath even at rest.  You get short of breath when doing very little physical activity.  You have difficulty eating, drinking, or talking due to asthma symptoms.  You develop chest pain.  You develop a fast heartbeat.  You have a bluish color to your lips or fingernails.  You are light-headed, dizzy, or faint.  Your peak flow is less than 50% of your personal best.   This information is not intended to replace advice given to you by your health care provider. Make sure you discuss any questions you have with your health care provider.   Document Released: 11/28/2005 Document Revised: 08/19/2015 Document Reviewed: 06/27/2013 Elsevier Interactive Patient Education [employer].

## 2016-05-13 NOTE — Progress Notes (Signed)
Patient ID: Danielle [name], female   DOB: [birth year], 35 y.o.   MRN: [id number]    By signing my name below, I, Essence Howell, attest that this documentation has been prepared under the direction and in the presence of Darlyne Russian, MD Electronically Signed: Ladene Artist, ED Scribe 05/13/2016 at 11:49 AM  Chief Complaint:  Chief Complaint  Patient presents with   Cough   Wheezing   Headache   Foot Pain    Right   HPI: Danielle [name] is a 35 y.o. female who reports to [facility] today complaining of persistent right foot pain for the past 2 months. Pt traveled to [place] for spring break and reports a lot of walking while there. She reports a constant, sharp pain that is exacerbated with ambulating and palpation. She reports associated symptoms of joint swelling to the area as well. Pt reports prolonged standing at work since she works as a Therapist, sports for a dermatology office. She has tried wrapping the area with an ace bandage while working without significant relief. Pt denies possibility of pregnancy; LNMP was 04/25/16 and she reports protected sexual intercourse with her husband.   Cough Pt also present with a persistently dry cough for the past 2 weeks. Pt reports associated symptoms of wheezing, chest congestion, hoarseness, sinus pressure, HA. She reports a h/o asthma and states that she has similar symptoms every year around this time. She has tried Zyrtec, Flonase, eye drops, Benadryl at night, Singular PRN, albuterol inhaler without significant relief. Pt reports a h/o asthma.   Past Medical History  Diagnosis Date   Other and unspecified ovarian cysts    Asthma    Rapid heart rate    Pulse irregularity    Generalized headaches    Seasonal allergies    Constipation    Past Surgical History  Procedure Laterality Date   Dilation and curettage of uterus  2005   Social History   Social History   Marital Status: Married    Spouse Name: N/A   Number of Children: 2    Years of Education: N/A   Occupational History   RN-Works in Recruitment consultant office    Social History Main Topics   Smoking status: Never Smoker    Smokeless tobacco: None   Alcohol Use: 0.5 oz/week    1 drink(s) per week     Comment: occasionally   Drug Use: No   Sexual Activity: Not Asked   Other Topics Concern   None   Social History Narrative   Family History  Problem Relation Age of Onset   CAD Neg Hx    Hypertension Mother    Hypertension Father    Allergies  Allergen Reactions   Doxycycline Nausea And Vomiting   Prior to Admission medications   Medication Sig Start Date End Date Taking? Authorizing Provider  albuterol (PROVENTIL HFA;VENTOLIN HFA) 108 (90 BASE) MCG/ACT inhaler Inhale 2 puffs into the lungs every 6 (six) hours as needed. For wheeze or shortness of breath   Yes Historical Provider, MD  cetirizine (ZYRTEC) 10 MG tablet Take 10 mg by mouth daily.   Yes Historical Provider, MD  clonazePAM (KLONOPIN) 0.5 MG tablet Take 0.5 mg by mouth 2 (two) times daily as needed for anxiety.   Yes Historical Provider, MD  escitalopram (LEXAPRO) 20 MG tablet Take 1 tablet (20 mg total) by mouth daily. 04/15/16  Yes Betty G Martinique, MD  montelukast (SINGULAIR) 10 MG tablet Take 1 tablet (10 mg total) by  mouth at bedtime. 04/15/16  Yes Betty G Martinique, MD   ROS: The patient denies fevers, chills, night sweats, unintentional weight loss, chest pain, palpitations, dyspnea on exertion, nausea, vomiting, abdominal pain, dysuria, hematuria, melena, numbness, weakness, or tingling.   All other systems have been reviewed and were otherwise negative with the exception of those mentioned in the HPI and as above.    PHYSICAL EXAM: Filed Vitals:   05/13/16 1020  BP: 118/78  Pulse: 91  Temp: 98.3 F (36.8 C)  Resp: 15   Body mass index is 26.59 kg/(m^2).  General: Alert, no acute distress HEENT:  Normocephalic, atraumatic, oropharynx patent. Swelling of the nasal turbinates.    Eye: Juliette Mangle [facility] Cardiovascular: Regular rate and rhythm, no rubs murmurs or gallops. No Carotid bruits, radial pulse intact. No pedal edema.  Respiratory: No wheezes or rales. No cyanosis, no use of accessory musculature. Occasional rhonchi with prolonged expiration on the R with episodes of cough Abdominal: No organomegaly, abdomen is soft and non-tender, positive bowel sounds. No masses. Musculoskeletal: Gait intact. R foot: tenderness and swelling at the base of the 2nd, 3rd and 4th toes.  Skin: No rashes. Neurologic: Facial musculature symmetric. Psychiatric: Patient acts appropriately throughout our interaction. Lymphatic: No cervical or submandibular lymphadenopathy  LABS: Results for orders placed or performed in visit on 05/13/16  POCT urine pregnancy  Result Value Ref Range   Preg Test, Ur Negative Negative   EKG/XRAY:   Primary read interpreted by Dr. Everlene Farrier at [facility]. Dg Foot Complete Right  05/13/2016  CLINICAL DATA:  Pain.  Initial evaluation. EXAM: RIGHT FOOT COMPLETE - 3+ VIEW COMPARISON:  None. FINDINGS: No acute bony or joint abnormality identified. Degenerative changes noted first metatarsophalangeal joint. Sclerotic changes noted the second metatarsal, this may be from old healed fracture. IMPRESSION: 1.  Degenerative changes first metatarsal. 2. Sclerotic changes second metatarsal this may be from old healed fracture. No acute abnormality. Electronically Signed   By: Marcello Moores  Register   On: 05/13/2016 11:46   ASSESSMENT/PLAN:   Patient improved her respiratory status after the albuterol. It did make her somewhat jittery. I did decide to put her on Qvar to decrease the frequency of using her albuterol. She has a sclerotic area of the second metatarsal. She was advised to wear comfortable shoes and will follow-up with the orthopedist in one month regarding this.I personally performed the services described in this documentation, which was scribed in my presence. The recorded  information has been reviewed and is accurate.  Gross sideeffects, risk and benefits, and alternatives of medications d/w patient. Patient is aware that all medications have potential sideeffects and we are unable to predict every sideeffect or drug-drug interaction that may occur.  Arlyss Queen MD 05/13/2016 10:41 AM

## 2016-05-19 ENCOUNTER — Telehealth: Payer: Self-pay

## 2016-05-19 NOTE — Telephone Encounter (Signed)
Patient saw Dr Everlene Farrier last Friday for a cold and breathing treatment, states she isn't any better and she would like to get on an antibiotic. States she has had this cold for going on 3 weeks now and she would like something to help her.  [facility] uses the [facility] [place] [place] - [address] and her call back number is [phone]

## 2016-05-20 ENCOUNTER — Other Ambulatory Visit: Payer: Self-pay | Admitting: Emergency Medicine

## 2016-05-20 DIAGNOSIS — J209 Acute bronchitis, unspecified: Secondary | ICD-10-CM

## 2016-05-20 MED ORDER — AZITHROMYCIN 250 MG PO TABS: ORAL_TABLET | ORAL | Status: DC

## 2016-05-20 NOTE — Telephone Encounter (Signed)
Call patient. [facility] for her to take for bronchitis. If she is not better in the next 48-72 hours she needs to return to clinic for re-evaluation

## 2016-05-20 NOTE — Telephone Encounter (Signed)
Spoke with pt and informed her that medication was sent in and to return if not better in 48-72 hrs

## 2016-07-13 ENCOUNTER — Encounter: Payer: Self-pay | Admitting: Adult Health

## 2016-07-13 ENCOUNTER — Ambulatory Visit: Payer: BLUE CROSS/BLUE SHIELD | Admitting: Family Medicine

## 2016-07-13 ENCOUNTER — Ambulatory Visit (INDEPENDENT_AMBULATORY_CARE_PROVIDER_SITE_OTHER): Payer: BLUE CROSS/BLUE SHIELD | Admitting: Adult Health

## 2016-07-13 VITALS — BP 106/70 | Temp 98.8°F | Ht 64.0 in | Wt 160.3 lb

## 2016-07-13 DIAGNOSIS — N3001 Acute cystitis with hematuria: Secondary | ICD-10-CM

## 2016-07-13 LAB — POCT URINALYSIS DIPSTICK
Bilirubin, UA: NEGATIVE
Glucose, UA: NEGATIVE
KETONES UA: NEGATIVE
Nitrite, UA: NEGATIVE
Spec Grav, UA: 1.015
UROBILINOGEN UA: NEGATIVE
pH, UA: 6.5

## 2016-07-13 MED ORDER — NITROFURANTOIN MACROCRYSTAL 100 MG PO CAPS
100.0000 mg | ORAL_CAPSULE | Freq: Four times a day (QID) | ORAL | 0 refills | Status: DC
Start: 2016-07-13 — End: 2016-10-07

## 2016-07-13 NOTE — Progress Notes (Signed)
[id number] Martinique, MD No chief complaint on file.   Current Issues:  Presents with 4 days of dysuria, urinary urgency and urinary frequency Associated symptoms include:  cloudy urine, dysuria, lower abdominal pain, urinary frequency, urinary retention and urinary urgency  There is no history of of similar symptoms. Sexually active:  Yes with female.   No concern for STI.  Prior to Admission medications   Medication Sig Start Date End Date Taking? Authorizing Provider  albuterol (PROVENTIL HFA;VENTOLIN HFA) 108 (90 BASE) MCG/ACT inhaler Inhale 2 puffs into the lungs every 6 (six) hours as needed. For wheeze or shortness of breath    Historical Provider, MD  cetirizine (ZYRTEC) 10 MG tablet Take 10 mg by mouth daily.    Historical Provider, MD  clonazePAM (KLONOPIN) 0.5 MG tablet Take 0.5 mg by mouth 2 (two) times daily as needed for anxiety.    Historical Provider, MD  escitalopram (LEXAPRO) 20 MG tablet Take 1 tablet (20 mg total) by mouth daily. 04/15/16   Betty G Martinique, MD    Review of Systems:Negative unless mentioned above  PE:  BP 106/70   Temp 98.8 F (37.1 C) (Oral)   Ht 5\' 4"  (1.626 m)   Wt 160 lb 4.8 oz (72.7 kg)   BMI 27.52 kg/m  Constitutional: Alert and oriented. Does not appear in any acute distress Heart: Normal rate and rhythm. No M/R/G Lungs: Clear to auscultation  Back: No CVA tenderness Abdomen: Tenderness with palpation to generalized abdomen   Results for orders placed or performed in visit on 05/13/16  POCT urine pregnancy  Result Value Ref Range   Preg Test, Ur Negative Negative    Assessment and Plan:   1. Acute cystitis with hematuria  - POCT urinalysis dipstick - Urine culture - nitrofurantoin (MACRODANTIN) 100 MG capsule; Take 1 capsule (100 mg total) by mouth 4 (four) times daily.  Dispense: 28 capsule; Refill: 0 - Follow up as needed  Dorothyann Peng, NP

## 2016-07-13 NOTE — Patient Instructions (Signed)

## 2016-07-16 LAB — URINE CULTURE

## 2016-08-23 ENCOUNTER — Telehealth: Payer: Self-pay | Admitting: Family Medicine

## 2016-08-23 NOTE — Telephone Encounter (Signed)
° °  Pt call to ask for a rx for the below med    albuterol (PROVENTIL HFA;VENTOLIN HFA) 108 (90 BASE) MCG/ACT inhaler   [facility] [address]

## 2016-08-24 ENCOUNTER — Other Ambulatory Visit: Payer: Self-pay | Admitting: Family Medicine

## 2016-08-24 MED ORDER — ALBUTEROL SULFATE HFA 108 (90 BASE) MCG/ACT IN AERS: 2.0000 | INHALATION_SPRAY | Freq: Four times a day (QID) | RESPIRATORY_TRACT | 3 refills | Status: DC | PRN

## 2016-08-24 NOTE — Telephone Encounter (Signed)
Rx was sent. ?Thanks, ?BJ ?

## 2016-10-07 ENCOUNTER — Ambulatory Visit (INDEPENDENT_AMBULATORY_CARE_PROVIDER_SITE_OTHER): Payer: BLUE CROSS/BLUE SHIELD | Admitting: Physician Assistant

## 2016-10-07 ENCOUNTER — Ambulatory Visit (INDEPENDENT_AMBULATORY_CARE_PROVIDER_SITE_OTHER): Payer: BLUE CROSS/BLUE SHIELD

## 2016-10-07 VITALS — BP 118/76 | HR 82 | Temp 98.3°F | Resp 16 | Ht 62.0 in | Wt 162.0 lb

## 2016-10-07 DIAGNOSIS — M25561 Pain in right knee: Secondary | ICD-10-CM

## 2016-10-07 DIAGNOSIS — M25569 Pain in unspecified knee: Secondary | ICD-10-CM

## 2016-10-07 NOTE — Patient Instructions (Signed)
     IF you received an x-ray today, you will receive an invoice from [facility]. Please contact [facility] at [phone] with questions or concerns regarding your invoice.   IF you received labwork today, you will receive an invoice from [facility]. Please contact [facility] at [phone] with questions or concerns regarding your invoice.   Our billing staff will not be able to assist you with questions regarding bills from these companies.  You will be contacted with the lab results as soon as they are available. The fastest way to get your results is to activate your My Chart account. Instructions are located on the last page of this paperwork. If you have not heard from us regarding the results in 2 weeks, please contact this office.      

## 2016-10-07 NOTE — Progress Notes (Signed)
10/07/2016 2:18 PM   DOB: Sep 08, 1981 / MRN: [id number]  SUBJECTIVE:  Danielle [name] is a 35 y.o. female with a history of stress fracture presenting for right knee pain that started after a 4 day trip to [place] in which she "walked everywhere."  REports the pain is global, and is made worse with knee extension.  She has tried Ibuprofen 600 mg every 12 hours with some relief. No history of previous injury.    She is allergic to doxycycline.   She  has a past medical history of Asthma; Constipation; Generalized headaches; Other and unspecified ovarian cysts; Pulse irregularity; Rapid heart rate; and Seasonal allergies.    She  reports that she has never smoked. She has never used smokeless tobacco. She reports that she drinks about 0.5 oz of alcohol per week . She reports that she does not use drugs. She  has no sexual activity history on file. The patient  has a past surgical history that includes Dilation and curettage of uterus (2005).  Her family history includes Hypertension in her father and mother.  Review of Systems  Constitutional: Negative for fever.  Musculoskeletal: Positive for joint pain. Negative for back pain, falls, myalgias and neck pain.  Skin: Negative for rash.  Neurological: Negative for dizziness.    The problem list and medications were reviewed and updated by myself where necessary and exist elsewhere in the encounter.   OBJECTIVE:  BP 118/76 (BP Location: Right Arm, Patient Position: Sitting, Cuff Size: Normal)   Pulse 82   Temp 98.3 F (36.8 C) (Oral)   Resp 16   Ht 5\' 2"  (1.575 m)   Wt 162 lb (73.5 kg)   LMP 09/12/2016 (Exact Date)   SpO2 99%   BMI 29.63 kg/m   Physical Exam  Cardiovascular: Normal rate, regular rhythm and normal heart sounds.   Pulmonary/Chest: Effort normal and breath sounds normal.  Musculoskeletal: Normal range of motion.       Right knee: She exhibits abnormal patellar mobility (postivie patellar apprehension test). She  exhibits normal range of motion, no swelling, no effusion, no deformity, no erythema, no LCL laxity, no bony tenderness, normal meniscus and no MCL laxity. No tenderness found. No medial joint line, no lateral joint line, no MCL, no LCL and no patellar tendon tenderness noted.  Neurological: She has normal strength. No cranial nerve deficit or sensory deficit. Coordination and gait normal. GCS eye subscore is 4. GCS verbal subscore is 5. GCS motor subscore is 6.    No results found for this or any previous visit (from the past 72 hour(s)).  Dg Knee Complete 4 Views Right  Result Date: 10/07/2016 CLINICAL DATA:  Right knee pain, positive patellar apprehension test. EXAM: RIGHT KNEE - COMPLETE 4+ VIEW COMPARISON:  None. FINDINGS: No evidence of fracture, dislocation, or joint effusion. No evidence of arthropathy or other focal bone abnormality. Soft tissues are unremarkable. IMPRESSION: Negative. Electronically Signed   By: Lajean Manes M.D.   On: 10/07/2016 13:55    ASSESSMENT AND PLAN  Briany was seen today for knee pain.  Diagnoses and all orders for this visit:  Positive patellar apprehension test: Hinged knee brace here, PT referral and continue her 600 mg ibuprofen at home.  Add Tylenol as needed.  -     Apply knee sleeve -     Ambulatory referral to Physical Therapy  Acute pain of right knee -     DG Knee Complete 4 Views Right; Future  The patient is advised to call or return to clinic if she does not see an improvement in symptoms, or to seek the care of the closest emergency department if she worsens with the above plan.   Philis Fendt, MHS, PA-C [facility] 10/07/2016 2:18 PM

## 2016-10-21 ENCOUNTER — Ambulatory Visit: Payer: BLUE CROSS/BLUE SHIELD | Admitting: Family Medicine

## 2016-11-21 ENCOUNTER — Institutional Professional Consult (permissible substitution): Payer: BLUE CROSS/BLUE SHIELD | Admitting: Neurology

## 2016-11-25 ENCOUNTER — Ambulatory Visit (INDEPENDENT_AMBULATORY_CARE_PROVIDER_SITE_OTHER): Payer: BLUE CROSS/BLUE SHIELD | Admitting: Family Medicine

## 2016-11-25 ENCOUNTER — Encounter: Payer: Self-pay | Admitting: Family Medicine

## 2016-11-25 VITALS — BP 110/76 | HR 94 | Resp 12 | Ht 62.0 in | Wt 161.2 lb

## 2016-11-25 DIAGNOSIS — Z0000 Encounter for general adult medical examination without abnormal findings: Secondary | ICD-10-CM | POA: Diagnosis not present

## 2016-11-25 DIAGNOSIS — Z131 Encounter for screening for diabetes mellitus: Secondary | ICD-10-CM

## 2016-11-25 DIAGNOSIS — Z23 Encounter for immunization: Secondary | ICD-10-CM

## 2016-11-25 DIAGNOSIS — Z13220 Encounter for screening for lipoid disorders: Secondary | ICD-10-CM

## 2016-11-25 DIAGNOSIS — F411 Generalized anxiety disorder: Secondary | ICD-10-CM

## 2016-11-25 LAB — LIPID PANEL
CHOLESTEROL: 174 mg/dL (ref 0–200)
HDL: 48.6 mg/dL (ref >39.00)
LDL CALC: 110 mg/dL — AB (ref 0–99)
NonHDL: 125.25
TRIGLYCERIDES: 76 mg/dL (ref 0.0–149.0)
Total CHOL/HDL Ratio: 4
VLDL: 15.2 mg/dL (ref 0.0–40.0)

## 2016-11-25 LAB — COMPREHENSIVE METABOLIC PANEL
ALK PHOS: 71 U/L (ref 39–117)
ALT: 15 U/L (ref 0–35)
AST: 15 U/L (ref 0–37)
Albumin: 4.5 g/dL (ref 3.5–5.2)
BILIRUBIN TOTAL: 0.8 mg/dL (ref 0.2–1.2)
BUN: 12 mg/dL (ref 6–23)
CO2: 26 meq/L (ref 19–32)
CREATININE: 0.69 mg/dL (ref 0.40–1.20)
Calcium: 9.6 mg/dL (ref 8.4–10.5)
Chloride: 106 mEq/L (ref 96–112)
GFR: 101.73 mL/min (ref >60.00)
GLUCOSE: 93 mg/dL (ref 70–99)
Potassium: 4.1 mEq/L (ref 3.5–5.1)
Sodium: 140 mEq/L (ref 135–145)
TOTAL PROTEIN: 7.1 g/dL (ref 6.0–8.3)

## 2016-11-25 MED ORDER — FLUOXETINE HCL 20 MG PO CAPS: 20.0000 mg | ORAL_CAPSULE | Freq: Every day | ORAL | 2 refills | Status: DC

## 2016-11-25 NOTE — Progress Notes (Signed)
Pre visit review using our clinic review tool, if applicable. No additional management support is needed unless otherwise documented below in the visit note. 

## 2016-11-25 NOTE — Patient Instructions (Signed)
A few things to remember from today's visit:   Diabetes mellitus screening - Plan: Comprehensive metabolic panel  Lipid screening - Plan: Lipid panel  Routine general medical examination at a health care facility  Generalized anxiety disorder - Plan: FLUoxetine (PROZAC) 20 MG capsule    At least 150 minutes of moderate exercise per week, daily brisk walking for 15-30 min is a good exercise option. Healthy diet low in saturated (animal) fats and sweets and consisting of fresh fruits and vegetables, lean meats such as fish and white chicken and whole grains.   - Vaccines:  Tdap vaccine every 10 years.  Shingles vaccine recommended at age 80, could be given after 37 years of age but not sure about insurance coverage.  Pneumonia vaccines:  Prevnar 13 at 65 and Pneumovax at 72.  Screening recommendations for low/normal risk women:  Screening for diabetes at age 71-45 and every 3 years.  Cervical cancer prevention:  Every 3-5 years in low risk females and depends of whether ot not HPV screening was done. Continue following with gyn.    -Breast cancer: Mammogram: usually starts at 52.  Colon cancer screening: starts at 37 years old until 37 years old.  Cholesterol disorder screening at age 71 and every 3 years.   Today we agree on decreasing Latuda in 1/2 (30 mg), starting Prozac, pending psychiatrist appt, no changes in rest.   Please be sure medication list is accurate. If a new problem present, please set up appointment sooner than planned today.

## 2016-11-25 NOTE — Progress Notes (Signed)
HPI:   [name] is a 35 y.o. female, who is here today for her routine physical.  She had her gyn examination last week.   She exercises some regularly, 1-2 times per week, and follows a healthy diet.  She lives with husband and 2 children.  Chronic medical problems: anxiety disorder and recently Dx with bipolar disorder.    FHx for gynecologic or colon cancer negative.     -Depression symptoms started in 05/2016, getting worse. She is following with psychiatrist q 3 months and psychotherapist, Dx with bipolar and started with Lamictal and Latuda about 3 months ago. She has not noted any difference with Latuda. She has had more fatigue than usual, sometimes she feels like she is going to fall asleep and has to take a nap. No trouble sleeping at night, she feels like she is sleeping more. She takes Lamictal in the morning.  She denies suicidal thoughts. She followed with psychiatrists about a months ago, she is closing her practice, so referring her to another provider, pending information about appt.  She feels like Prozac is the medication that has helped the most. She has tried Effexor, Wellbutrin, Lexapro, and Sertraline among some.  She feels like her job was exacerbating her symptoms, she is starting a new job in the next few days. She needs PPD done.   Review of Systems  Constitutional: Positive for fatigue. Negative for appetite change, diaphoresis and fever.  HENT: Negative for dental problem, hearing loss, mouth sores, trouble swallowing and voice change.   Eyes: Negative for photophobia and visual disturbance.  Respiratory: Negative for cough, shortness of breath and wheezing.   Cardiovascular: Negative for chest pain and leg swelling.  Gastrointestinal: Negative for abdominal pain, nausea and vomiting.       No changes in bowel habits.  Endocrine: Negative for cold intolerance, heat intolerance, polydipsia, polyphagia and polyuria.    Genitourinary: Negative for decreased urine volume, difficulty urinating, dysuria and hematuria.  Musculoskeletal: Negative for arthralgias, back pain and neck pain.  Skin: Negative for color change and rash.  Allergic/Immunologic: Positive for environmental allergies.  Neurological: Negative for seizures, syncope, weakness and headaches.  Hematological: Negative for adenopathy. Does not bruise/bleed easily.  Psychiatric/Behavioral: Negative for confusion, hallucinations, self-injury, sleep disturbance and suicidal ideas. The patient is nervous/anxious.   All other systems reviewed and are negative.     Current Outpatient Prescriptions on File Prior to Visit  Medication Sig Dispense Refill  . albuterol (PROVENTIL HFA;VENTOLIN HFA) 108 (90 Base) MCG/ACT inhaler Inhale 2 puffs into the lungs every 6 (six) hours as needed. For wheeze or shortness of breath 18 g 3  . cetirizine (ZYRTEC) 10 MG tablet Take 10 mg by mouth daily.    . clonazePAM (KLONOPIN) 0.5 MG tablet Take 0.5 mg by mouth 2 (two) times daily as needed for anxiety.     No current facility-administered medications on file prior to visit.      Past Medical History:  Diagnosis Date  . Asthma   . Constipation   . Generalized headaches   . Other and unspecified ovarian cysts   . Pulse irregularity   . Rapid heart rate   . Seasonal allergies     Allergies  Allergen Reactions  . Doxycycline Nausea And Vomiting    Family History  Problem Relation Age of Onset  . CAD Neg Hx   . Hypertension Mother   . Hypertension Father     Social History  Social History  . Marital status: Married    Spouse name: N/A  . Number of children: 2  . Years of education: N/A   Occupational History  . 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 Smoking status: Never Smoker  . Smokeless tobacco: Never Used  . Alcohol use 0.5 oz/week    1 Standard drinks or equivalent per week     Comment: occasionally  . Drug  use: No  . Sexual activity: Not Asked   Other Topics Concern  . None   Social History Narrative  . None     Vitals:   11/25/16 0856  BP: 110/76  Pulse: 94  Resp: 12   Body mass index is 29.49 kg/m.  O2 sat at RA 97%  Wt Readings from Last 3 Encounters:  11/25/16 161 lb 4 oz (73.1 kg)  10/07/16 162 lb (73.5 kg)  07/13/16 160 lb 4.8 oz (72.7 kg)     Physical Exam  Nursing note and vitals reviewed. Constitutional: She is oriented to person, place, and time. She appears well-developed. No distress.  HENT:  Head: Atraumatic.  Right Ear: Hearing, tympanic membrane, external ear and ear canal normal.  Left Ear: Hearing, tympanic membrane, external ear and ear canal normal.  Mouth/Throat: Uvula is midline, oropharynx is clear and moist and mucous membranes are normal.  Eyes: Conjunctivae and EOM are normal. Pupils are equal, round, and reactive to light.  Neck: Neck supple. No thyroid mass and no thyromegaly present.  Cardiovascular: Normal rate.  An irregular rhythm present.  No murmur heard. Pulses:      Dorsalis pedis pulses are 2+ on the right side, and 2+ on the left side.       Posterior tibial pulses are 2+ on the right side, and 2+ on the left side.  Hx of sinus arrhythmia   Respiratory: Effort normal and breath sounds normal. No respiratory distress.  GI: Soft. She exhibits no mass. There is no hepatomegaly. There is no tenderness.  Musculoskeletal: She exhibits no edema.  No major deformity or sign of synovitis appreciated.  Lymphadenopathy:    She has no cervical adenopathy.       Right: No supraclavicular adenopathy present.       Left: No supraclavicular adenopathy present.  Neurological: She is alert and oriented to person, place, and time. She has normal strength. No cranial nerve deficit. Coordination and gait normal.  Reflex Scores:      Bicep reflexes are 2+ on the right side and 2+ on the left side.      Patellar reflexes are 2+ on the right side  and 2+ on the left side. Skin: Skin is warm. No rash noted. No erythema.  Psychiatric: Her speech is normal. Her mood appears anxious. Cognition and memory are normal. She expresses no suicidal ideation.  Well groomed, good eye contact.      ASSESSMENT AND PLAN:    Danielle [name] was seen today for annual exam.  Diagnoses and all orders for this visit:    Routine general medical examination at a health care facility   We discussed the importance of regular physical activity and healthy diet for prevention of chronic illness and/or complications. Preventive guidelines reviewed. Vaccination updated.  Next CPE in 1-3 years.   -     PPD  Diabetes mellitus screening -     Comprehensive metabolic panel  Lipid screening -     Lipid panel  Generalized anxiety disorder  As well as bipolar disorder. She really would like to stop Latuda, so recommended decreasing dose from 35 yo 30 mg. No changes in Lamictal, side effects discussed, she could take it at night and see if this helps with fatigue. Prozac 20 mg daily added. Pending appt with psychiatrists, she will let me know how she does with changes made today. Instructed about warning signs.   -     FLUoxetine (PROZAC) 20 MG capsule; Take 1 capsule (20 mg total) by mouth daily.  Need for Tdap vaccination -     Tdap vaccine greater than or equal to 7yo IM       Return in 2 months (on 01/26/2017) for mood disorder,depression.      Betty G. Martinique, MD  [facility]. [place] office.

## 2016-11-28 ENCOUNTER — Other Ambulatory Visit: Payer: Self-pay

## 2016-11-28 DIAGNOSIS — Z23 Encounter for immunization: Secondary | ICD-10-CM

## 2016-11-28 LAB — TB SKIN TEST
INDURATION: 0 mm
TB SKIN TEST: NEGATIVE

## 2016-12-16 ENCOUNTER — Telehealth: Payer: Self-pay | Admitting: Family Medicine

## 2016-12-16 MED ORDER — FLUOXETINE HCL 40 MG PO CAPS: 40.0000 mg | ORAL_CAPSULE | Freq: Every day | ORAL | 0 refills | Status: DC

## 2016-12-16 NOTE — Telephone Encounter (Signed)
Rx sent 

## 2016-12-16 NOTE — Telephone Encounter (Signed)
See below

## 2016-12-16 NOTE — Telephone Encounter (Signed)
Pt would like to increase prozac to 40  Mg. Pt has stop her latuda. [facility] [address]

## 2016-12-16 NOTE — Telephone Encounter (Signed)
It is OK to go a head and increase Prozac from 20 mg to 40 mg daily. # 90/0 Please remind her to establish with new psychiatrists. Thanks, BJ

## 2017-01-10 ENCOUNTER — Ambulatory Visit (INDEPENDENT_AMBULATORY_CARE_PROVIDER_SITE_OTHER): Payer: BLUE CROSS/BLUE SHIELD | Admitting: Family Medicine

## 2017-01-10 ENCOUNTER — Encounter: Payer: Self-pay | Admitting: Family Medicine

## 2017-01-10 VITALS — BP 118/80 | HR 130 | Temp 100.4°F | Resp 16 | Ht 62.0 in | Wt 156.0 lb

## 2017-01-10 DIAGNOSIS — R509 Fever, unspecified: Secondary | ICD-10-CM | POA: Diagnosis not present

## 2017-01-10 DIAGNOSIS — R52 Pain, unspecified: Secondary | ICD-10-CM | POA: Diagnosis not present

## 2017-01-10 DIAGNOSIS — R05 Cough: Secondary | ICD-10-CM

## 2017-01-10 DIAGNOSIS — R059 Cough, unspecified: Secondary | ICD-10-CM

## 2017-01-10 MED ORDER — PREDNISONE 20 MG PO TABS: 40.0000 mg | ORAL_TABLET | Freq: Every day | ORAL | 0 refills | Status: DC

## 2017-01-10 MED ORDER — BENZONATATE 100 MG PO CAPS: 100.0000 mg | ORAL_CAPSULE | Freq: Three times a day (TID) | ORAL | 0 refills | Status: DC | PRN

## 2017-01-10 MED ORDER — OSELTAMIVIR PHOSPHATE 75 MG PO CAPS: 75.0000 mg | ORAL_CAPSULE | Freq: Two times a day (BID) | ORAL | 0 refills | Status: DC

## 2017-01-10 NOTE — Progress Notes (Signed)
Patient ID: Danielle [name], female    DOB: 27-Sep-1981, 36 y.o.   MRN: [id number]  PCP: Betty Martinique, MD  Chief Complaint  Patient presents with  . Fever    x last night   . Cough    X 1 day, pt says pain in back when she coughs   . Generalized Body Aches    x last night     Subjective:  HPI  36 year old female presents for evaluation of influenza like symptoms x one day. Pt reports upper back very painful from coughing. Persistent nasal drainage. Pt reports increased wheezing and use of albuterol. Reports a fever max 100.5  this morning.     Social History   Social History  . Marital status: Married    Spouse name: N/A  . Number of children: 2  . Years of education: N/A   Occupational History  . 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 Smoking status: Never Smoker  . Smokeless tobacco: Never Used  . Alcohol use 0.5 oz/week    1 Standard drinks or equivalent per week     Comment: occasionally  . Drug use: No  . Sexual activity: Not on file   Other Topics Concern  . Not on file   Social History Narrative  . No narrative on file    Family History  Problem Relation Age of Onset  . CAD Neg Hx   . Hypertension Mother   . Hypertension Father      Review of Systems  Patient Active Problem List   Diagnosis Date Noted  . Asthma with exacerbation 05/13/2016  . Allergic rhinitis 05/13/2016  . 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 Generalized anxiety disorder 04/15/2016  . Palpitations 10/11/2012    Allergies  Allergen Reactions  . Doxycycline Nausea And Vomiting    Prior to Admission medications   Medication Sig Start Date End Date Taking? Authorizing Provider  albuterol (PROVENTIL HFA;VENTOLIN HFA) 108 (90 Base) MCG/ACT inhaler Inhale 2 puffs into the lungs every 6 (six) hours as needed. For wheeze or shortness of breath 08/24/16   Betty G Martinique, MD  cetirizine (ZYRTEC) 10 MG tablet Take 10 mg by mouth daily.     Historical Provider, MD  clonazePAM (KLONOPIN) 0.5 MG tablet Take 0.5 mg by mouth 2 (two) times daily as needed for anxiety.    Historical Provider, MD  FLUoxetine (PROZAC) 40 MG capsule Take 1 capsule (40 mg total) by mouth daily. 12/16/16   Betty G Martinique, MD  lamoTRIgine (LAMICTAL) 150 MG tablet Take 150 mg by mouth daily.    Historical Provider, MD  Lurasidone HCl (LATUDA) 60 MG TABS Take 1 tablet by mouth daily.    Historical Provider, MD    Past Medical, Surgical Family and Social History reviewed and updated.    Objective:   Today's Vitals   01/10/17 0943  BP: 118/80  Pulse: (!) 130  Resp: 16  Temp: (!) 100.4 F (38 C)  TempSrc: Oral  SpO2: 98%  Weight: 156 lb (70.8 kg)  Height: 5\' 2"  (1.575 m)    Wt Readings from Last 3 Encounters:  11/25/16 161 lb 4 oz (73.1 kg)  10/07/16 162 lb (73.5 kg)  07/13/16 160 lb 4.8 oz (72.7 kg)   Physical Exam  Constitutional: She is oriented to person, place, and time. She appears well-developed and well-nourished. She has a sickly appearance. She appears ill.  HENT:  Head: Normocephalic and  atraumatic.  Right Ear: External ear normal.  Left Ear: External ear normal.  Nose: Mucosal edema and rhinorrhea present.  Mouth/Throat: Oropharynx is clear and moist. No oropharyngeal exudate.  Eyes: Conjunctivae and EOM are normal. Pupils are equal, round, and reactive to light.  Neck: Normal range of motion. Neck supple.  Cardiovascular: Normal rate, regular rhythm, normal heart sounds and intact distal pulses.   Pulmonary/Chest: Effort normal. She has wheezes.  Lymphadenopathy:    She has cervical adenopathy.  Neurological: She is alert and oriented to person, place, and time.  Skin: Skin is warm and dry.  Psychiatric: She has a normal mood and affect. Her behavior is normal. Judgment and thought content normal.    Assessment & Plan:  1. Fever, unspecified fever cause 2. Body aches 3. Cough  -Treating empirically for influenza with  oseltamivir (Tamiflu) 75 mg, 2 times daily x 5 days. -Take Prednisone 40 mg x 5 days.  Complete all medication. -Benzonatate 100-200 mg x 3 times daily for cough -Return for care if shortness or breath or wheezing develops. -Refrain from work until fever free for 24 hours without Tylenol or Ibuprofen  Follow-up as needed or if symptoms worsen.  Carroll Sage. Kenton Kingfisher, MSN, FNP-C [facility]

## 2017-01-10 NOTE — Patient Instructions (Addendum)
Start Tamiflu 75 mg twice daily x 5 days.  Take Benzonatate 100-200 mg up to 3 times daily for cough.   Prednisone 40 mg x 5 days.  Refrain from work or contact with large crowds until fever free for at least 24 hours without tylenol or ibuprofen.    IF you received an x-ray today, you will receive an invoice from [facility]. Please contact [facility] at [phone] with questions or concerns regarding your invoice.   IF you received labwork today, you will receive an invoice from [facility]. Please contact [facility] at [phone] with questions or concerns regarding your invoice.   Our billing staff will not be able to assist you with questions regarding bills from these companies.  You will be contacted with the lab results as soon as they are available. The fastest way to get your results is to activate your My Chart account. Instructions are located on the last page of this paperwork. If you have not heard from [place] regarding the results in 2 weeks, please contact this office.     Influenza, Adult Influenza ("the flu") is an infection in the lungs, nose, and throat (respiratory tract). It is caused by a virus. The flu causes many common cold symptoms, as well as a high fever and body aches. It can make you feel very sick. The flu spreads easily from person to person (is contagious). Getting a flu shot (influenza vaccination) every year is the best way to prevent the flu. Follow these instructions at home:  Take over-the-counter and prescription medicines only as told by your doctor.  Use a cool mist humidifier to add moisture (humidity) to the air in your home. This can make it easier to breathe.  Rest as needed.  Drink enough fluid to keep your pee (urine) clear or pale yellow.  Cover your mouth and nose when you cough or sneeze.  Wash your hands with soap and water often, especially after you cough or sneeze. If you cannot use soap and water, use hand  sanitizer.  Stay home from work or school as told by your doctor. Unless you are visiting your doctor, try to avoid leaving home until your fever has been gone for 24 hours without the use of medicine.  Keep all follow-up visits as told by your doctor. This is important. How is this prevented?  Getting a yearly (annual) flu shot is the best way to avoid getting the flu. You may get the flu shot in late summer, fall, or winter. Ask your doctor when you should get your flu shot.  Wash your hands often or use hand sanitizer often.  Avoid contact with people who are sick during cold and flu season.  Eat healthy foods.  Drink plenty of fluids.  Get enough sleep.  Exercise regularly. Contact a doctor if:  You get new symptoms.  You have:  Chest pain.  Watery poop (diarrhea).  A fever.  Your cough gets worse.  You start to have more mucus.  You feel sick to your stomach (nauseous).  You throw up (vomit). Get help right away if:  You start to be short of breath or have trouble breathing.  Your skin or nails turn a bluish color.  You have very bad pain or stiffness in your neck.  You get a sudden headache.  You get sudden pain in your face or ear.  You cannot stop throwing up. This information is not intended to replace advice given to you by your  health care provider. Make sure you discuss any questions you have with your health care provider. Document Released: 09/06/2008 Document Revised: 05/05/2016 Document Reviewed: 09/22/2015 Elsevier Interactive Patient Education  2017 [employer].

## 2017-01-11 ENCOUNTER — Telehealth: Payer: Self-pay | Admitting: Family Medicine

## 2017-01-11 NOTE — Telephone Encounter (Signed)
Pt would like you to know she has an appt with Dr Eulas Post at [facility], a psychiatrist so she cancelled her appt with Dr Martinique on 01/27/2017

## 2017-01-12 NOTE — Telephone Encounter (Signed)
Just an fyi

## 2017-01-27 ENCOUNTER — Ambulatory Visit: Payer: BLUE CROSS/BLUE SHIELD | Admitting: Family Medicine

## 2017-05-16 ENCOUNTER — Ambulatory Visit (INDEPENDENT_AMBULATORY_CARE_PROVIDER_SITE_OTHER): Payer: BLUE CROSS/BLUE SHIELD | Admitting: Family Medicine

## 2017-05-16 ENCOUNTER — Encounter: Payer: Self-pay | Admitting: Family Medicine

## 2017-05-16 VITALS — BP 100/78 | HR 92 | Temp 98.3°F | Ht 62.0 in | Wt 168.0 lb

## 2017-05-16 DIAGNOSIS — R0602 Shortness of breath: Secondary | ICD-10-CM

## 2017-05-16 DIAGNOSIS — J988 Other specified respiratory disorders: Secondary | ICD-10-CM | POA: Diagnosis not present

## 2017-05-16 DIAGNOSIS — J4521 Mild intermittent asthma with (acute) exacerbation: Secondary | ICD-10-CM | POA: Diagnosis not present

## 2017-05-16 MED ORDER — AZITHROMYCIN 250 MG PO TABS: ORAL_TABLET | ORAL | 0 refills | Status: AC

## 2017-05-16 MED ORDER — PREDNISONE 20 MG PO TABS: 40.0000 mg | ORAL_TABLET | Freq: Every day | ORAL | 0 refills | Status: AC

## 2017-05-16 MED ORDER — ALBUTEROL SULFATE HFA 108 (90 BASE) MCG/ACT IN AERS: 2.0000 | INHALATION_SPRAY | Freq: Four times a day (QID) | RESPIRATORY_TRACT | 3 refills | Status: DC | PRN

## 2017-05-16 NOTE — Patient Instructions (Signed)
A few things to remember from today's visit:   Mild intermittent asthma with acute exacerbation - Plan: predniSONE (DELTASONE) 20 MG tablet, azithromycin (ZITHROMAX) 250 MG tablet  Respiratory tract infection - Plan: azithromycin (ZITHROMAX) 250 MG tablet  Shortness of breath - Plan: predniSONE (DELTASONE) 20 MG tablet   Over the counter medications as decongestants and cold medications usually help, they need to be taken with caution if there is a history of high blood pressure or palpitations.  Plenty of fluids. Honey helps with cough. Steam inhalations helps with runny nose, nasal congestion, and may prevent sinus infections. Cough and nasal congestion could last a few days and sometimes weeks. Please follow in not any better in 1-2 weeks or if symptoms get worse.  Albuterol inh 2 puff every 6 hours for a week then as needed for wheezing or shortness of breath.     Please be sure medication list is accurate. If a new problem present, please set up appointment sooner than planned today.

## 2017-05-16 NOTE — Progress Notes (Signed)
HPI:  ACUTE VISIT  Chief Complaint  Patient presents with  . Headache  . Sore Throat  . Cough    Danielle [name] is a 36 y.o.female here today complaining of 10 days of respiratory symptoms.  Initially she has fever and sore throat but both resolved. Last time she noted subjective fever was 2 days ago.  Cough  This is a recurrent problem. The current episode started 1 to 4 weeks ago. The problem has been gradually worsening. The cough is non-productive. Associated symptoms include headaches (frontoparietal pressure, mild), nasal congestion, postnasal drip, rhinorrhea and shortness of breath. Pertinent negatives include no chills, ear congestion, ear pain, eye redness, hemoptysis, myalgias, rash, sore throat or wheezing. The symptoms are aggravated by exercise. She has tried a beta-agonist inhaler for the symptoms. The treatment provided mild relief. Her past medical history is significant for asthma and environmental allergies.    Chest tightness and dyspnea, feels like she cannot get a "full breath." Hx of asthma and allergic rhinitis, she has not noted wheezing.  No Hx of recent travel. No sick contact. No known insect bite.  OTC medications for this problem: Sudafed and Albuterol inh.   Review of Systems  Constitutional: Positive for fatigue. Negative for appetite change and chills.  HENT: Positive for postnasal drip, rhinorrhea and sinus pressure. Negative for ear pain, facial swelling, mouth sores, nosebleeds, sore throat and trouble swallowing.   Eyes: Negative for redness and visual disturbance.  Respiratory: Positive for cough, chest tightness and shortness of breath. Negative for hemoptysis, wheezing and stridor.   Gastrointestinal: Negative for diarrhea, nausea and vomiting.  Musculoskeletal: Negative for back pain and myalgias.  Skin: Negative for rash.  Allergic/Immunologic: Positive for environmental allergies.  Neurological: Positive for headaches  (frontoparietal pressure, mild). Negative for syncope and weakness.  Hematological: Negative for adenopathy. Does not bruise/bleed easily.  Psychiatric/Behavioral: Negative for confusion. The patient is nervous/anxious.       Current Outpatient Prescriptions on File Prior to Visit  Medication Sig Dispense Refill  . cetirizine (ZYRTEC) 10 MG tablet Take 10 mg by mouth daily.    . clonazePAM (KLONOPIN) 0.5 MG tablet Take 0.5 mg by mouth 2 (two) times daily as needed for anxiety.     No current facility-administered medications on file prior to visit.      Past Medical History:  Diagnosis Date  . Asthma   . Constipation   . Generalized headaches   . Other and unspecified ovarian cysts   . Pulse irregularity   . Rapid heart rate   . Seasonal allergies    Allergies  Allergen Reactions  . Doxycycline Nausea And Vomiting    Social History   Social History  . Marital status: Married    Spouse name: N/A  . Number of children: 2  . Years of education: N/A   Occupational History  . 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 Smoking status: Never Smoker  . Smokeless tobacco: Never Used  . Alcohol use 0.5 oz/week    1 Standard drinks or equivalent per week     Comment: occasionally  . Drug use: No  . Sexual activity: Not Asked   Other Topics Concern  . None   Social History Narrative  . None    Vitals:   05/16/17 1100  BP: 100/78  Pulse: 92  Temp: 98.3 F (36.8 C)  O2 sat at RA 98% Body mass index is 30.73  kg/m.  Physical Exam  Nursing note and vitals reviewed. Constitutional: She is oriented to person, place, and time. She appears well-developed. She does not appear ill. No distress.  HENT:  Head: Atraumatic.  Right Ear: Tympanic membrane, external ear and ear canal normal.  Left Ear: Tympanic membrane, external ear and ear canal normal.  Nose: Rhinorrhea present. Right sinus exhibits no maxillary sinus tenderness and no frontal sinus  tenderness. Left sinus exhibits no maxillary sinus tenderness and no frontal sinus tenderness.  Mouth/Throat: Oropharynx is clear and moist and mucous membranes are normal.  Eyes: Conjunctivae are normal.  Cardiovascular: Normal rate and regular rhythm.   No murmur heard. Respiratory: Effort normal. No stridor. No respiratory distress. She has no wheezes. She has rhonchi (occasional).  Prolonged expiration.  Lymphadenopathy:       Head (right side): No submandibular adenopathy present.       Head (left side): No submandibular adenopathy present.    She has no cervical adenopathy.  Neurological: She is alert and oriented to person, place, and time. She has normal strength. Gait normal.  Skin: Skin is warm. No rash noted. No erythema.  Psychiatric: She has a normal mood and affect. Her speech is normal.  Well groomed, good eye contact.    ASSESSMENT AND PLAN:   Madigan was seen today for headache, sore throat and cough.  Diagnoses and all orders for this visit:  Mild intermittent asthma with acute exacerbation  Albuterol inh 2 puff every 6 hours for a week then as needed for wheezing or shortness of breath.  Some side affects of Prednisone discussed, she has taken it before and has tolerated it well. Instructed about warning signs,f/u as needed.  -     predniSONE (DELTASONE) 20 MG tablet; Take 2 tablets (40 mg total) by mouth daily with breakfast. -     azithromycin (ZITHROMAX) 250 MG tablet; 2 tabs day 1, then 1 tab daily -     albuterol (PROVENTIL HFA;VENTOLIN HFA) 108 (90 Base) MCG/ACT inhaler; Inhale 2 puffs into the lungs every 6 (six) hours as needed. For wheeze or shortness of breath  Respiratory tract infection  Explained that it could be viral with residual symptoms, because Hx of asthma and symptoms getting worse antibiotic treatment was recommended. Explained that cough and congestion can last a few weeks. OTC Mucinex might also help. Follow-up as needed.  -      azithromycin (ZITHROMAX) 250 MG tablet; 2 tabs day 1, then 1 tab daily  Shortness of breath  Today O2 sat in normal range and lung auscultation does not suggest CAP, she is not in respiratory distress. So I do not think imaging or further work-up are needed at this time. Most likely related to asthma exacerbation. Instructed about warning signs.  -     predniSONE (DELTASONE) 20 MG tablet; Take 2 tablets (40 mg total) by mouth daily with breakfast.     -Ms. Karmah Potocki was advised to seek attention immediately if symptoms worsen or to follow if they persist or new concerns arise.       Chrisean Kloth G. Martinique, MD  [facility]. [place] office.

## 2017-06-28 ENCOUNTER — Ambulatory Visit (INDEPENDENT_AMBULATORY_CARE_PROVIDER_SITE_OTHER): Payer: BLUE CROSS/BLUE SHIELD

## 2017-06-28 ENCOUNTER — Encounter: Payer: Self-pay | Admitting: Physician Assistant

## 2017-06-28 ENCOUNTER — Ambulatory Visit (INDEPENDENT_AMBULATORY_CARE_PROVIDER_SITE_OTHER): Payer: BLUE CROSS/BLUE SHIELD | Admitting: Physician Assistant

## 2017-06-28 VITALS — BP 121/79 | HR 93 | Temp 98.9°F | Resp 18 | Ht 62.0 in | Wt 167.2 lb

## 2017-06-28 DIAGNOSIS — M79672 Pain in left foot: Secondary | ICD-10-CM

## 2017-06-28 DIAGNOSIS — S92355A Nondisplaced fracture of fifth metatarsal bone, left foot, initial encounter for closed fracture: Secondary | ICD-10-CM

## 2017-06-28 NOTE — Progress Notes (Signed)
PRIMARY CARE AT [facility] [address] [place] [address] [phone]  Date:  06/28/2017   Name:  Caedence Snowden   DOB:  1981-05-03   MRN:  [id number]  PCP:  Martinique, Betty G, MD    History of Present Illness:  Lille Karim is a 36 y.o. female patient who presents to PCP with  Chief Complaint  Patient presents with  . Foot Injury    tripped this morning and fell over a little brick wall and heard something pop, left foot hurts      Patient tripped this morning over a brick wall.  While standing up and placing her weight down on left foot, she felt a pop.  She may have stood with her foot inverted.  Pain initiated but worsened as the day went on along with swelling.  She has never injured this foot prior.  She has not done anything for the pain.    Patient Active Problem List   Diagnosis Date Noted  . Asthma with exacerbation 05/13/2016  . Allergic rhinitis 05/13/2016  . 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 Generalized anxiety disorder 04/15/2016  . Palpitations 10/11/2012    Past Medical History:  Diagnosis Date  . Asthma   . Constipation   . Generalized headaches   . Other and unspecified ovarian cysts   . Pulse irregularity   . Rapid heart rate   . Seasonal allergies     Past Surgical History:  Procedure Laterality Date  . DILATION AND CURETTAGE OF UTERUS  2005    Social History  Substance Use Topics  . Smoking status: Never Smoker  . Smokeless tobacco: Never Used  . Alcohol use 0.5 oz/week    1 Standard drinks or equivalent per week     Comment: occasionally    Family History  Problem Relation Age of Onset  . CAD Neg Hx   . Hypertension Mother   . Hypertension Father     Allergies  Allergen Reactions  . Doxycycline Nausea And Vomiting    Medication list has been reviewed and updated.  Current Outpatient Prescriptions on File Prior to Visit  Medication Sig Dispense Refill  . albuterol (PROVENTIL HFA;VENTOLIN HFA) 108 (90  Base) MCG/ACT inhaler Inhale 2 puffs into the lungs every 6 (six) hours as needed. For wheeze or shortness of breath 18 g 3  . cetirizine (ZYRTEC) 10 MG tablet Take 10 mg by mouth daily.    . clonazePAM (KLONOPIN) 0.5 MG tablet Take 0.5 mg by mouth 2 (two) times daily as needed for anxiety.    Marland Kitchen L-Methylfolate-Algae (DEPLIN 15 PO) Take 1 tablet by mouth daily.    Marland Kitchen lamoTRIgine (LAMICTAL) 25 MG tablet Take 50 mg by mouth daily.    Marland Kitchen lurasidone (LATUDA) 20 MG TABS tablet Take 20 mg by mouth daily.     No current facility-administered medications on file prior to visit.     ROS ROS otherwise unremarkable unless listed above.  Physical Examination: BP 121/79   Pulse 93   Temp 98.9 F (37.2 C) (Oral)   Resp 18   Ht 5\' 2"  (1.575 m)   Wt 167 lb 3.2 oz (75.8 kg)   LMP 06/13/2017   SpO2 96%   BMI 30.58 kg/m  Ideal Body Weight: Weight in (lb) to have BMI = 25: 136.4  Physical Exam  Constitutional: She is oriented to person, place, and time. She appears well-developed and well-nourished. No distress.  HENT:  Head: Normocephalic and atraumatic.  Right Ear: External ear normal.  Left Ear: External ear normal.  Eyes: Pupils are equal, round, and reactive to light. Conjunctivae and EOM are normal.  Cardiovascular: Normal rate.   Pulmonary/Chest: Effort normal. No respiratory distress.  Musculoskeletal:       Left foot: There is normal range of motion and no tenderness.  Swelling over the proximal area of metatarsal of the 5th toe.  Tender along this area.  Pain incited with passive plantar flexion.    Neurological: She is alert and oriented to person, place, and time.  Skin: She is not diaphoretic.  Psychiatric: She has a normal mood and affect. Her behavior is normal.    Dg Foot Complete Left  Result Date: 06/28/2017 CLINICAL DATA:  5th metatarsal (proximal) tender with mild swelling. Pain with 5th flexion, extension and inversion EXAM: LEFT FOOT - COMPLETE 3+ VIEW COMPARISON:  None.  FINDINGS: There is a nondisplaced fracture involving the base of the fifth metatarsal with suspected intra-articular extension. Minimal amount of adjacent soft tissue swelling. No radiopaque foreign body. Joint spaces are preserved. No erosions. No significant hallux valgus deformity. Tiny plantar calcaneal spur. IMPRESSION: Nondisplaced fracture involving the base of the fifth metatarsal with intra-articular extension. Electronically Signed   By: Sandi Mariscal M.D.   On: 06/28/2017 11:02     Assessment and Plan: Leeana Creer is a 36 y.o. female who is here today for cc of left foot pain Post op shoe and crutches given As this is in the articular area, will place with orthopedist for consult.  Closed nondisplaced fracture of fifth metatarsal bone of left foot, initial encounter  Left foot pain - Plan: DG Foot Complete Left  Ivar Drape, PA-C [facility] 7/19/20189:25 AM

## 2017-06-28 NOTE — Patient Instructions (Addendum)
Appointment with [facility], [place] above the [facility] at [place]-- with Dr. Delilah Shan at 1:45 but be there at 1: 15.  Bring with you the xray CD, license and insurance card.  Please do not bear weight to the foot at this time.     IF you received an x-ray today, you will receive an invoice from [facility]. Please contact [facility] at [phone] with questions or concerns regarding your invoice.   IF you received labwork today, you will receive an invoice from [place]. Please contact [facility] at [phone] with questions or concerns regarding your invoice.   Our billing staff will not be able to assist you with questions regarding bills from these companies.  You will be contacted with the lab results as soon as they are available. The fastest way to get your results is to activate your My Chart account. Instructions are located on the last page of this paperwork. If you have not heard from [place] regarding the results in 2 weeks, please contact this office.

## 2017-11-29 ENCOUNTER — Ambulatory Visit: Payer: BLUE CROSS/BLUE SHIELD

## 2017-11-29 ENCOUNTER — Ambulatory Visit (INDEPENDENT_AMBULATORY_CARE_PROVIDER_SITE_OTHER): Payer: BLUE CROSS/BLUE SHIELD | Admitting: Family Medicine

## 2017-11-29 DIAGNOSIS — Z111 Encounter for screening for respiratory tuberculosis: Secondary | ICD-10-CM

## 2017-11-29 NOTE — Progress Notes (Signed)
PPD placed today  Pt aware to come back Friday 12/01/17 to have it read.  Varney Daily, CMA

## 2017-12-01 LAB — TB SKIN TEST
Induration: 0 mm
TB Skin Test: NEGATIVE

## 2017-12-15 ENCOUNTER — Encounter: Payer: BLUE CROSS/BLUE SHIELD | Admitting: Family Medicine

## 2017-12-29 DIAGNOSIS — F39 Unspecified mood [affective] disorder: Secondary | ICD-10-CM | POA: Diagnosis not present

## 2018-01-24 ENCOUNTER — Ambulatory Visit: Payer: BLUE CROSS/BLUE SHIELD | Admitting: Family Medicine

## 2018-01-24 ENCOUNTER — Encounter: Payer: Self-pay | Admitting: Family Medicine

## 2018-01-24 VITALS — BP 110/80 | HR 81 | Temp 99.1°F | Resp 16 | Wt 175.8 lb

## 2018-01-24 DIAGNOSIS — J069 Acute upper respiratory infection, unspecified: Secondary | ICD-10-CM

## 2018-01-24 DIAGNOSIS — J309 Allergic rhinitis, unspecified: Secondary | ICD-10-CM

## 2018-01-24 DIAGNOSIS — J019 Acute sinusitis, unspecified: Secondary | ICD-10-CM

## 2018-01-24 MED ORDER — PREDNISONE 20 MG PO TABS: 40.0000 mg | ORAL_TABLET | Freq: Every day | ORAL | 0 refills | Status: AC

## 2018-01-24 MED ORDER — AMOXICILLIN-POT CLAVULANATE 875-125 MG PO TABS: 1.0000 | ORAL_TABLET | Freq: Two times a day (BID) | ORAL | 0 refills | Status: AC

## 2018-01-24 NOTE — Progress Notes (Signed)
ACUTE VISIT  HPI:  Chief Complaint  Patient presents with  . Sinus Problem    Nasal congestion x1 week. Worsening sinus pressure and headache since yesterday severe enough to cause nausea.    [name] is a 37 y.o.female here today complaining of over a week of nasal congestion and sinus pressure, worse for the past couple days. Frontal and left maxillary pain, severe that sometimes causes nausea. Pain is constant ,radiated to her left mandibular area. She ahs not identified exacerbating or alleviating factors.  "A little bit" of bloody rhinorrhea.  Subjective fever and chills.  She denies visual changes,vomiting,numbness, or focal deficit.    Sinus Problem  This is a new problem. The current episode started 1 to 4 weeks ago. The problem has been gradually worsening since onset. The pain is severe. Associated symptoms include chills, congestion and headaches. Pertinent negatives include no coughing, diaphoresis, ear pain, hoarse voice, neck pain, shortness of breath, sinus pressure, sneezing, sore throat or swollen glands.    No cough. No Hx of recent travel. No sick contact.   Hx of allergies: Yes. Hx of allergic rhinitis,she is on Zyrtec 10 mg daily. + Asthma,she has not noted wheezing or dyspnea.   OTC medications for this problem: She tried decongestants for a few days but did not help.   Review of Systems  Constitutional: Positive for chills, fatigue and fever. Negative for activity change, appetite change and diaphoresis.  HENT: Positive for congestion, nosebleeds, postnasal drip and sinus pain. Negative for ear pain, facial swelling, hoarse voice, mouth sores, rhinorrhea, sinus pressure, sneezing, sore throat, trouble swallowing and voice change.   Eyes: Negative for discharge and redness.  Respiratory: Negative for cough, shortness of breath and wheezing.   Gastrointestinal: Positive for nausea. Negative for abdominal pain, diarrhea and  vomiting.  Musculoskeletal: Negative for back pain, myalgias and neck pain.  Skin: Negative for rash.  Allergic/Immunologic: Positive for environmental allergies.  Neurological: Positive for headaches. Negative for syncope, facial asymmetry and weakness.  Hematological: Negative for adenopathy. Does not bruise/bleed easily.  Psychiatric/Behavioral: Negative for confusion. The patient is nervous/anxious.       Current Outpatient Medications on File Prior to Visit  Medication Sig Dispense Refill  . albuterol (PROVENTIL HFA;VENTOLIN HFA) 108 (90 Base) MCG/ACT inhaler Inhale 2 puffs into the lungs every 6 (six) hours as needed. For wheeze or shortness of breath 18 g 3  . cetirizine (ZYRTEC) 10 MG tablet Take 10 mg by mouth daily.    . clonazePAM (KLONOPIN) 0.5 MG tablet Take 0.5 mg by mouth 2 (two) times daily as needed for anxiety.    Marland Kitchen L-Methylfolate-Algae (DEPLIN 15 PO) Take 1 tablet by mouth daily.    Marland Kitchen lamoTRIgine (LAMICTAL) 25 MG tablet Take 50 mg by mouth daily.    Marland Kitchen lurasidone (LATUDA) 20 MG TABS tablet Take 20 mg by mouth daily.    . traZODone (DESYREL) 150 MG tablet Take by mouth at bedtime.     No current facility-administered medications on file prior to visit.      Past Medical History:  Diagnosis Date  . Asthma   . Constipation   . Generalized headaches   . Other and unspecified ovarian cysts   . Pulse irregularity   . Rapid heart rate   . Seasonal allergies    Allergies  Allergen Reactions  . Doxycycline Nausea And Vomiting    Social History   Socioeconomic History  . Marital status: Married  Spouse name: None  . Number of children: 2  . Years of education: None  . Highest education level: None  Social Needs  . Financial resource strain: None  . Food insecurity - worry: None  . Food insecurity - inability: None  . Transportation needs - medical: None  . Transportation needs - non-medical: None  Occupational History  . 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 Smoking status: Never Smoker  . Smokeless tobacco: Never Used  Substance and Sexual Activity  . Alcohol use: Yes    Alcohol/week: 0.5 oz    Types: 1 Standard drinks or equivalent per week    Comment: occasionally  . Drug use: No  . Sexual activity: None  Other Topics Concern  . None  Social History Narrative  . None    Vitals:   01/24/18 1027  BP: 110/80  Pulse: 81  Resp: 16  Temp: 99.1 F (37.3 C)  SpO2: 98%   Body mass index is 32.15 kg/m.   Physical Exam  Nursing note and vitals reviewed. Constitutional: She is oriented to person, place, and time. She appears well-developed. She does not appear ill. No distress.  HENT:  Head: Normocephalic and atraumatic.  Right Ear: Tympanic membrane, external ear and ear canal normal.  Left Ear: Tympanic membrane, external ear and ear canal normal.  Nose: Rhinorrhea and septal deviation present. Right sinus exhibits frontal sinus tenderness. Right sinus exhibits no maxillary sinus tenderness. Left sinus exhibits maxillary sinus tenderness and frontal sinus tenderness.  Mouth/Throat: Oropharynx is clear and moist and mucous membranes are normal.  Nasal voice  Eyes: Conjunctivae and EOM are normal. Pupils are equal, round, and reactive to light.  Neck: No muscular tenderness present. No edema and no erythema present.  Cardiovascular: Normal rate and regular rhythm.  No murmur heard. Respiratory: Effort normal and breath sounds normal. No stridor. No respiratory distress.  Lymphadenopathy:       Head (right side): No submandibular adenopathy present.       Head (left side): No submandibular adenopathy present.    She has cervical adenopathy.       Right cervical: Posterior cervical adenopathy present.       Left cervical: Posterior cervical adenopathy present.  Neurological: She is alert and oriented to person, place, and time. She has normal strength. No cranial nerve deficit. Gait normal.  Skin: Skin is  warm. No rash noted. No erythema.  Psychiatric: Her speech is normal. Her mood appears anxious.  Well groomed, good eye contact.      ASSESSMENT AND PLAN:   [name] was seen today for sinus problem.  Diagnoses and all orders for this visit:  Acute sinusitis, recurrence not specified, unspecified location  Explained that symptoms can be viral, in which case abx will not help. Side effects of abx discussed. If symptoms do not resolve sinus CT will be considered.  -     amoxicillin-clavulanate (AUGMENTIN) 875-125 MG tablet; Take 1 tablet by mouth 2 (two) times daily for 7 days.  URI, acute  I also explained that nasal congestion can last a few days and sometimes weeks. F/U as needed.   Allergic rhinitis, unspecified seasonality, unspecified trigger  This problem could be aggravating URI symptoms. Nasal irrigations with saline,steam inhalations. Prednisone may also help.  -     predniSONE (DELTASONE) 20 MG tablet; Take 2 tablets (40 mg total) by mouth daily with breakfast for 3 days.      -Ms. [name]  was advised to seek attention immediately if symptoms worsen or to follow if they persist or new concerns arise.       Phinneas Shakoor G. Martinique, MD  [facility]. [place] office.

## 2018-01-24 NOTE — Patient Instructions (Signed)
A few things to remember from today's visit:   Acute sinusitis, recurrence not specified, unspecified location - Plan: amoxicillin-clavulanate (AUGMENTIN) 875-125 MG tablet  URI, acute  Nasal sinus congestion - Plan: predniSONE (DELTASONE) 20 MG tablet   Sinusitis, Adult Sinusitis is soreness and inflammation of your sinuses. Sinuses are hollow spaces in the bones around your face. They are located:  Around your eyes.  In the middle of your forehead.  Behind your nose.  In your cheekbones.  Your sinuses and nasal passages are lined with a stringy fluid (mucus). Mucus normally drains out of your sinuses. When your nasal tissues get inflamed or swollen, the mucus can get trapped or blocked so air cannot flow through your sinuses. This lets bacteria, viruses, and funguses grow, and that leads to infection. Follow these instructions at home: Medicines  Take, use, or apply over-the-counter and prescription medicines only as told by your doctor. These may include nasal sprays.  If you were prescribed an antibiotic medicine, take it as told by your doctor. Do not stop taking the antibiotic even if you start to feel better. Hydrate and Humidify  Drink enough water to keep your pee (urine) clear or pale yellow.  Use a cool mist humidifier to keep the humidity level in your home above 50%.  Breathe in steam for 10-15 minutes, 3-4 times a day or as told by your doctor. You can do this in the bathroom while a hot shower is running.  Try not to spend time in cool or dry air. Rest  Rest as much as possible.  Sleep with your head raised (elevated).  Make sure to get enough sleep each night. General instructions  Put a warm, moist washcloth on your face 3-4 times a day or as told by your doctor. This will help with discomfort.  Wash your hands often with soap and water. If there is no soap and water, use hand sanitizer.  Do not smoke. Avoid being around people who are smoking  (secondhand smoke).  Keep all follow-up visits as told by your doctor. This is important. Contact a doctor if:  You have a fever.  Your symptoms get worse.  Your symptoms do not get better within 10 days. Get help right away if:  You have a very bad headache.  You cannot stop throwing up (vomiting).  You have pain or swelling around your face or eyes.  You have trouble seeing.  You feel confused.  Your neck is stiff.  You have trouble breathing. This information is not intended to replace advice given to you by your health care provider. Make sure you discuss any questions you have with your health care provider. Document Released: 05/16/2008 Document Revised: 07/24/2016 Document Reviewed: 09/23/2015 Elsevier Interactive Patient Education  [employer].  Please be sure medication list is accurate. If a new problem present, please set up appointment sooner than planned today.

## 2018-01-25 NOTE — Progress Notes (Signed)
HPI:   Danielle [name] is a 37 y.o. female, who is here today for her routine physical.  Last CPE: 11/2016. She follows with gyn, last time over a year and has an appt in a week.   Regular exercise 3 or more time per week: Not regularly but for the past months she has gone to the gyn 2-3 times per week. Following a healthy diet: Yes. She lives with her husband and 2 children.  Chronic medical problems: Allergic rhinitis,anxiety and bipolar disorder. She follows with psychiatrist. Symptoms have been well controlled but she is frustrated because medications are causing wt gain.  She is currently on abx treatment for acute sinusitis,seen on 01/24/18. Symptoms have improved.     Immunization History  Administered Date(s) Administered  . Influenza-Unspecified 08/31/2017  . PPD Test 11/25/2016, 11/29/2017  . Tdap 11/25/2016     Asthma: Albuterol inh about once per week,more if se has a respiratory infection.  She is also concerned about skin lesion on right cheek, which she noted in 10/2017. She wonders if it is [place]. Hx of sun exposure when she was younger.No she is more carefull with sun exposure. Lesion is painless,not pruritic,and has not changed.    Review of Systems  Constitutional: Negative for appetite change, fatigue and fever.  HENT: Positive for congestion, postnasal drip and rhinorrhea. Negative for hearing loss, mouth sores, sore throat, trouble swallowing and voice change.   Eyes: Negative for redness and visual disturbance.  Respiratory: Negative for cough, shortness of breath and wheezing.   Cardiovascular: Negative for chest pain and leg swelling.  Gastrointestinal: Negative for abdominal pain, nausea and vomiting.       No changes in bowel habits.  Endocrine: Negative for cold intolerance, heat intolerance, polydipsia, polyphagia and polyuria.  Genitourinary: Negative for decreased urine volume, dysuria, hematuria, vaginal bleeding and vaginal  discharge.  Musculoskeletal: Negative for arthralgias, back pain and neck pain.  Skin: Positive for rash. Negative for color change.  Allergic/Immunologic: Positive for environmental allergies.  Neurological: Negative for syncope, weakness and headaches.  Hematological: Negative for adenopathy. Does not bruise/bleed easily.  Psychiatric/Behavioral: Negative for confusion, hallucinations and sleep disturbance. The patient is nervous/anxious.   All other systems reviewed and are negative.     Current Outpatient Medications on File Prior to Visit  Medication Sig Dispense Refill  . amoxicillin-clavulanate (AUGMENTIN) 875-125 MG tablet Take 1 tablet by mouth 2 (two) times daily for 7 days. 14 tablet 0  . cetirizine (ZYRTEC) 10 MG tablet Take 10 mg by mouth daily.    . clonazePAM (KLONOPIN) 0.5 MG tablet Take 0.5 mg by mouth 2 (two) times daily as needed for anxiety.    . lamoTRIgine (LAMICTAL) 25 MG tablet Take 50 mg by mouth daily.    Marland Kitchen lurasidone (LATUDA) 20 MG TABS tablet Take 20 mg by mouth daily.    . traZODone (DESYREL) 150 MG tablet Take by mouth at bedtime.    Marland Kitchen L-Methylfolate-Algae (DEPLIN 15 PO) Take 1 tablet by mouth daily.     No current facility-administered medications on file prior to visit.      Past Medical History:  Diagnosis Date  . Asthma   . Constipation   . Generalized headaches   . Other and unspecified ovarian cysts   . Pulse irregularity   . Rapid heart rate   . Seasonal allergies     Past Surgical History:  Procedure Laterality Date  . DILATION AND CURETTAGE OF UTERUS  2005    Allergies  Allergen Reactions  . Doxycycline Nausea And Vomiting    Family History  Problem Relation Age of Onset  . CAD Neg Hx   . Hypertension Mother   . Hypertension Father     Social History   Socioeconomic History  . Marital status: Married    Spouse name: None  . Number of children: 2  . Years of education: None  . Highest education level: None  Social  Needs  . Financial resource strain: None  . Food insecurity - worry: None  . Food insecurity - inability: None  . Transportation needs - medical: None  . Transportation needs - non-medical: None  Occupational History  . Occupation: RN-Works in Teacher, music  Tobacco Use  . Smoking status: Never Smoker  . Smokeless tobacco: Never Used  Substance and Sexual Activity  . Alcohol use: Yes    Alcohol/week: 0.5 oz    Types: 1 Standard drinks or equivalent per week    Comment: occasionally  . Drug use: No  . Sexual activity: None  Other Topics Concern  . None  Social History Narrative  . None     Vitals:   01/26/18 0858  BP: 124/82  Pulse: 74  Resp: 12  Temp: 98 F (36.7 C)  SpO2: 98%   Body mass index is 31.73 kg/m.   Wt Readings from Last 3 Encounters:  01/26/18 173 lb 8 oz (78.7 kg)  01/24/18 175 lb 12.8 oz (79.7 kg)  06/28/17 167 lb 3.2 oz (75.8 kg)    Physical Exam  Nursing note and vitals reviewed. Constitutional: She is oriented to person, place, and time. She appears well-developed. No distress.  HENT:  Head: Normocephalic and atraumatic.  Right Ear: Hearing, tympanic membrane, external ear and ear canal normal.  Left Ear: Hearing, tympanic membrane, external ear and ear canal normal.  Mouth/Throat: Uvula is midline, oropharynx is clear and moist and mucous membranes are normal.  Eyes: Conjunctivae and EOM are normal. Pupils are equal, round, and reactive to light.  Neck: No tracheal deviation present. No thyromegaly present.  Cardiovascular: Normal rate and regular rhythm.  No murmur heard. Pulses:      Dorsalis pedis pulses are 2+ on the right side, and 2+ on the left side.  Respiratory: Effort normal and breath sounds normal. No respiratory distress.  GI: Soft. She exhibits no mass. There is no hepatomegaly. There is no tenderness.  Genitourinary:  Genitourinary Comments: Deferred to gyn.  Musculoskeletal: She exhibits no edema or tenderness.  No  major deformity or signs of synovitis appreciated.  Lymphadenopathy:    She has no cervical adenopathy.       Right: No supraclavicular adenopathy present.       Left: No supraclavicular adenopathy present.  Neurological: She is alert and oriented to person, place, and time. She has normal strength. No cranial nerve deficit. Coordination and gait normal.  Reflex Scores:      Bicep reflexes are 2+ on the right side and 2+ on the left side.      Patellar reflexes are 2+ on the right side and 2+ on the left side. Skin: Skin is warm. No rash noted. No erythema.  Right cheek small hyperpigmented macular lesion, 2 mm ,defined borders. No suspicious lesions.  Psychiatric: She has a normal mood and affect. Her speech is normal.  Well groomed, good eye contact.     ASSESSMENT AND PLAN:  Ms. Prachi Oftedahl was here today annual physical  examination.   Orders Placed This Encounter  Procedures  . Lipid panel  . Basic metabolic panel   Lab Results  Component Value Date   CHOL 185 01/26/2018   HDL 45.00 01/26/2018   LDLCALC 111 (H) 01/26/2018   TRIG 145.0 01/26/2018   CHOLHDL 4 01/26/2018   Lab Results  Component Value Date   CREATININE 0.73 01/26/2018   BUN 17 01/26/2018   NA 140 01/26/2018   K 3.6 01/26/2018   CL 105 01/26/2018   CO2 25 01/26/2018    Routine general medical examination at a health care facility  We discussed the importance of regular physical activity and healthy diet for prevention of chronic illness and/or complications. Preventive guidelines reviewed. Vaccination up to date. She has an appt with her gyn in a few days for her female preventive care. Next CPE in a year.   Screening for lipid disorders -     Lipid panel  Diabetes mellitus screening -     Basic metabolic panel  Mild intermittent asthma with acute exacerbation  Well controlled. No changes in current management. F/U in 12 months.  -     albuterol (PROVENTIL HFA;VENTOLIN HFA) 108  (90 Base) MCG/ACT inhaler; Inhale 2 puffs into the lungs every 6 (six) hours as needed. For wheeze or shortness of breath   Reassured about skin lesion,it seems to be a post inflammatory hyperpigmentation. Instructed to continue monitoring for changes. Continue dun screening use and avoidance of direct sun exposure.  She will continue following with her psychiatrist.    Return in 1 year (on 01/26/2019) for cpe.    Azalya Galyon G. Martinique, MD  [facility]. [place] office.

## 2018-01-26 ENCOUNTER — Encounter: Payer: Self-pay | Admitting: Family Medicine

## 2018-01-26 ENCOUNTER — Ambulatory Visit (INDEPENDENT_AMBULATORY_CARE_PROVIDER_SITE_OTHER): Payer: BLUE CROSS/BLUE SHIELD | Admitting: Family Medicine

## 2018-01-26 VITALS — BP 124/82 | HR 74 | Temp 98.0°F | Resp 12 | Ht 62.0 in | Wt 173.5 lb

## 2018-01-26 DIAGNOSIS — Z0000 Encounter for general adult medical examination without abnormal findings: Secondary | ICD-10-CM

## 2018-01-26 DIAGNOSIS — Z13220 Encounter for screening for lipoid disorders: Secondary | ICD-10-CM

## 2018-01-26 DIAGNOSIS — J4521 Mild intermittent asthma with (acute) exacerbation: Secondary | ICD-10-CM

## 2018-01-26 DIAGNOSIS — Z131 Encounter for screening for diabetes mellitus: Secondary | ICD-10-CM

## 2018-01-26 LAB — LIPID PANEL
CHOL/HDL RATIO: 4
Cholesterol: 185 mg/dL (ref 0–200)
HDL: 45 mg/dL (ref >39.00)
LDL Cholesterol: 111 mg/dL — ABNORMAL HIGH (ref 0–99)
NonHDL: 140.15
TRIGLYCERIDES: 145 mg/dL (ref 0.0–149.0)
VLDL: 29 mg/dL (ref 0.0–40.0)

## 2018-01-26 LAB — BASIC METABOLIC PANEL
BUN: 17 mg/dL (ref 6–23)
CHLORIDE: 105 meq/L (ref 96–112)
CO2: 25 meq/L (ref 19–32)
CREATININE: 0.73 mg/dL (ref 0.40–1.20)
Calcium: 9.1 mg/dL (ref 8.4–10.5)
GFR: 94.72 mL/min (ref >60.00)
Glucose, Bld: 100 mg/dL — ABNORMAL HIGH (ref 70–99)
Potassium: 3.6 mEq/L (ref 3.5–5.1)
Sodium: 140 mEq/L (ref 135–145)

## 2018-01-26 MED ORDER — ALBUTEROL SULFATE HFA 108 (90 BASE) MCG/ACT IN AERS: 2.0000 | INHALATION_SPRAY | Freq: Four times a day (QID) | RESPIRATORY_TRACT | 6 refills | Status: DC | PRN

## 2018-01-26 NOTE — Patient Instructions (Signed)
A few things to remember from today's visit:   Routine general medical examination at a health care facility  Screening for lipid disorders - Plan: Lipid panel  Diabetes mellitus screening - Plan: Basic metabolic panel  Mild intermittent asthma with acute exacerbation - Plan: albuterol (PROVENTIL HFA;VENTOLIN HFA) 108 (90 Base) MCG/ACT inhaler  Today you have you routine preventive visit.  At least 150 minutes of moderate exercise per week, daily brisk walking for 15-30 min is a good exercise option. Healthy diet low in saturated (animal) fats and sweets and consisting of fresh fruits and vegetables, lean meats such as fish and white chicken and whole grains.  These are some of recommendations for screening depending of age and risk factors:   - Vaccines:  Tdap vaccine every 10 years.  Shingles vaccine recommended at age 72, could be given after 39 years of age but not sure about insurance coverage.   Pneumonia vaccines:  Prevnar 13 at 65 and Pneumovax at 87. Sometimes Pneumovax is giving earlier if history of smoking, lung disease,diabetes,kidney disease among some.    Screening for diabetes at age 76 and every 3 years.  Cervical cancer prevention:  Pap smear starts at 39 years of age and continues periodically until 39 years old in low risk women. Pap smear every 3 years between 69 and 34 years old. Pap smear every 3-5 years between women 58 and older if pap smear negative and HPV screening negative.   -Breast cancer: Mammogram: There is disagreement between experts about when to start screening in low risk asymptomatic female but recent recommendations are to start screening at 36 and not later than 39 years old , every 1-2 years and after 39 yo q 2 years. Screening is recommended until 39 years old but some women can continue screening depending of healthy issues.   Colon cancer screening: starts at 39 years old until 39 years old.  Cholesterol disorder screening at age  55 and every 3 years.  Also recommended:  1. Dental visit- Brush and floss your teeth twice daily; visit your dentist twice a year. 2. Eye doctor- Get an eye exam at least every 2 years. 3. Helmet use- Always wear a helmet when riding a bicycle, motorcycle, rollerblading or skateboarding. 4. Safe sex- If you may be exposed to sexually transmitted infections, use a condom. 5. Seat belts- Seat belts can save your live; always wear one. 6. Smoke/Carbon Monoxide detectors- These detectors need to be installed on the appropriate level of your home. Replace batteries at least once a year. 7. Skin cancer- When out in the sun please cover up and use sunscreen 15 SPF or higher. 8. Violence- If anyone is threatening or hurting you, please tell your healthcare provider.  9. Drink alcohol in moderation- Limit alcohol intake to one drink or less per day. Never drink and drive.  Please be sure medication list is accurate. If a new problem present, please set up appointment sooner than planned today.

## 2018-01-30 ENCOUNTER — Ambulatory Visit: Payer: Self-pay

## 2018-01-30 NOTE — Telephone Encounter (Signed)
She is already on oral abx. It can also be related to allergies and/or viral illness in both cases abx does not help. OTC Mucinex D may help as well as Singulair 10 mg daily at bedtime (Rx for allergies). I would like to have a sinus CT done. Thanks, BJ

## 2018-01-30 NOTE — Telephone Encounter (Signed)
Pt. Is on her 6 day of Augmentin and is still having cheek pain and headaches. Taking "a lot of Motrin." Cautioned to take Motrin as directed.Requests "something else be called in."  Reason for Disposition . [1] Taking antibiotic > 7 days AND [2] nasal discharge not improved  Answer Assessment - Initial Assessment Questions 1. ANTIBIOTIC: "What antibiotic are you receiving?" "How many times per day?"     Augmentin 2. ONSET: "When was the antibiotic started?"     Last Wednesday 3. PAIN: "How bad is the sinus pain?"   (Scale 1-10; mild, moderate or severe)   - MILD (1-3): doesn't interfere with normal activities    - MODERATE (4-7): interferes with normal activities (e.g., work or school) or awakens from sleep   - SEVERE (8-10): excruciating pain and patient unable to do any normal activities        6-7 4. FEVER: "Do you have a fever?" If so, ask: "What is it, how was it measured, and when did it start?"      No - but has chills 5. SYMPTOMS: "Are there any other symptoms you're concerned about?" If so, ask: "When did it start?"     No 6. PREGNANCY: "Is there any chance you are pregnant?" "When was your last menstrual period?"     No  Protocols used: SINUS INFECTION ON ANTIBIOTIC FOLLOW-UP CALL-A-AH

## 2018-01-30 NOTE — Telephone Encounter (Signed)
Spoke with patient, gave instructions per Dr. Martinique. Patient verbalized understanding. Patient does not want Sinus CT done at this time.

## 2018-02-02 ENCOUNTER — Other Ambulatory Visit: Payer: Self-pay | Admitting: Obstetrics and Gynecology

## 2018-02-02 DIAGNOSIS — N6320 Unspecified lump in the left breast, unspecified quadrant: Secondary | ICD-10-CM

## 2018-02-02 DIAGNOSIS — Z6832 Body mass index (BMI) 32.0-32.9, adult: Secondary | ICD-10-CM | POA: Diagnosis not present

## 2018-02-02 DIAGNOSIS — Z01419 Encounter for gynecological examination (general) (routine) without abnormal findings: Secondary | ICD-10-CM | POA: Diagnosis not present

## 2018-02-07 ENCOUNTER — Ambulatory Visit
Admission: RE | Admit: 2018-02-07 | Discharge: 2018-02-07 | Disposition: A | Discharge status: Home / Self Care | Payer: BLUE CROSS/BLUE SHIELD | Source: Ambulatory Visit | Attending: Obstetrics and Gynecology | Admitting: Obstetrics and Gynecology

## 2018-02-07 DIAGNOSIS — R928 Other abnormal and inconclusive findings on diagnostic imaging of breast: Secondary | ICD-10-CM | POA: Diagnosis not present

## 2018-02-07 DIAGNOSIS — N6320 Unspecified lump in the left breast, unspecified quadrant: Secondary | ICD-10-CM

## 2018-02-07 DIAGNOSIS — N644 Mastodynia: Secondary | ICD-10-CM | POA: Diagnosis not present

## 2018-03-13 ENCOUNTER — Encounter: Payer: Self-pay | Admitting: Physician Assistant

## 2018-05-30 DIAGNOSIS — F39 Unspecified mood [affective] disorder: Secondary | ICD-10-CM | POA: Diagnosis not present

## 2018-11-14 ENCOUNTER — Ambulatory Visit: Payer: Self-pay | Admitting: Psychiatry

## 2018-11-14 ENCOUNTER — Ambulatory Visit (INDEPENDENT_AMBULATORY_CARE_PROVIDER_SITE_OTHER): Payer: BLUE CROSS/BLUE SHIELD | Admitting: *Deleted

## 2018-11-14 DIAGNOSIS — Z111 Encounter for screening for respiratory tuberculosis: Secondary | ICD-10-CM | POA: Diagnosis not present

## 2018-11-14 NOTE — Progress Notes (Signed)
Per orders of Dr. Martinique, PPD placement given by Westley Hummer. Patient tolerated injection well.

## 2018-11-16 LAB — TB SKIN TEST
Induration: 0 mm
TB Skin Test: NEGATIVE

## 2018-11-22 ENCOUNTER — Encounter: Payer: Self-pay | Admitting: Emergency Medicine

## 2018-11-22 DIAGNOSIS — F39 Unspecified mood [affective] disorder: Secondary | ICD-10-CM | POA: Insufficient documentation

## 2018-11-22 DIAGNOSIS — F410 Panic disorder [episodic paroxysmal anxiety] without agoraphobia: Secondary | ICD-10-CM | POA: Insufficient documentation

## 2018-12-03 ENCOUNTER — Encounter: Payer: Self-pay | Admitting: Psychiatry

## 2018-12-03 ENCOUNTER — Ambulatory Visit: Payer: BLUE CROSS/BLUE SHIELD | Admitting: Psychiatry

## 2018-12-03 VITALS — BP 104/71 | HR 88

## 2018-12-03 DIAGNOSIS — F99 Mental disorder, not otherwise specified: Secondary | ICD-10-CM

## 2018-12-03 DIAGNOSIS — F5105 Insomnia due to other mental disorder: Secondary | ICD-10-CM | POA: Diagnosis not present

## 2018-12-03 DIAGNOSIS — F39 Unspecified mood [affective] disorder: Secondary | ICD-10-CM

## 2018-12-03 DIAGNOSIS — F411 Generalized anxiety disorder: Secondary | ICD-10-CM | POA: Diagnosis not present

## 2018-12-03 DIAGNOSIS — F410 Panic disorder [episodic paroxysmal anxiety] without agoraphobia: Secondary | ICD-10-CM

## 2018-12-03 MED ORDER — BREXPIPRAZOLE 0.5 MG PO TABS: 0.5000 mg | ORAL_TABLET | Freq: Every day | ORAL | 0 refills | Status: AC

## 2018-12-03 MED ORDER — CLONAZEPAM 0.5 MG PO TABS: 0.5000 mg | ORAL_TABLET | Freq: Two times a day (BID) | ORAL | 5 refills | Status: DC | PRN

## 2018-12-03 MED ORDER — LURASIDONE HCL 20 MG PO TABS: 20.0000 mg | ORAL_TABLET | Freq: Every day | ORAL | 5 refills | Status: DC

## 2018-12-03 MED ORDER — TRAZODONE HCL 50 MG PO TABS: ORAL_TABLET | ORAL | 1 refills | Status: DC

## 2018-12-03 NOTE — Progress Notes (Signed)
Danielle [name] [id number] [birth year] 37 y.o.  Subjective:   Patient ID:  Danielle [name] is a 37 y.o. (DOB 1981-01-01) female.  Chief Complaint:  Chief Complaint  Patient presents with  . Follow-up    h/o Depression and anxiety    HPI Danielle [name] presents to the office today for follow-up of depression and anxiety. "I'm kind of in the same spot I was before. I am not getting any worse, but not getting any better." Reports that she tried Viibryd and had severe panic after first dose and was not able to take any additional doses. Denies any other recent panic attacks. Denies severe depression currently. Reports "I went through a rough couple of weeks in October" with increased depression, crying, low energy, and low motivation. "I feel like I am ok, but I don't get super happy" and has not had as much joy. Reports anxiety is "about the same." Reports that she has to take Klonopin more often than she would like and has to take it prior to some social events or going to see certain patients. During depression in October experienced "woryring fits at night" and reports that she continues to have some worry to include things that have happened in the past, such as ex-husband's suicide. Reports rumination at night that interfered with sleep initiation. She reports that this has improved some but continues to occur periodically. Reports that it can take 1-2 hours before she is able to fall asleep. Typically sleeping about 9 hours a night. Reports that she is unable to stay awake later. She reports that her appetite is "fine" and that she is not at her desired weight. Reports motivation has been low towards exercise. She reports that she is typically able "to get done what I have to get done" and does not do more than what has to get done. Reports that she is not as interested in some hobbies or activities. Reports that her energy has been "kind of low" with periods of lower energy at different  times of the day. Reports that she tries not to nap but will periodically need to nap. Reports adequate concentration. Denies SI.   Reports improved irritability. Denies impulsive or risky behaviors.   Past Psychiatric Medication Trials: Abilify- Tremor Latuda Vraylar- Irritability, Agitation Lamictal Viibryd- panic Prozac Effexor XR Zoloft Lexapro Wellbutrin- Headaches Buspar Xanax- excessive somnolence, ineffective Clorazepate- ineffective Klonopin Ativan Trileptal-ineffective Trazodone Deplin Propranolol    Review of Systems:  Review of Systems  Musculoskeletal: Negative for gait problem.  Neurological: Negative for tremors.  Psychiatric/Behavioral:       Please refer to HPI    Medications: I have reviewed the patient's current medications.  Current Outpatient Medications  Medication Sig Dispense Refill  . albuterol (PROVENTIL HFA;VENTOLIN HFA) 108 (90 Base) MCG/ACT inhaler Inhale 2 puffs into the lungs every 6 (six) hours as needed. For wheeze or shortness of breath 18 g 6  . cetirizine (ZYRTEC) 10 MG tablet Take 10 mg by mouth daily as needed.     . clonazePAM (KLONOPIN) 0.5 MG tablet Take 1 tablet (0.5 mg total) by mouth 2 (two) times daily as needed for anxiety. 60 tablet 5  . lurasidone (LATUDA) 20 MG TABS tablet Take 1 tablet (20 mg total) by mouth daily. 30 tablet 5  . Brexpiprazole (REXULTI) 0.5 MG TABS Take 1 tablet (0.5 mg total) by mouth daily for 15 days. 15 tablet 0  . traZODone (DESYREL) 50 MG tablet Take 1-3 tabs po QHS prn  insomnia 270 tablet 1   No current facility-administered medications for this visit.     Medication Side Effects: Other: Had dizziness and nausea with Trazodone at 150 mg. mild side effects at 100 mg  Allergies:  Allergies  Allergen Reactions  . Doxycycline Nausea And Vomiting    Past Medical History:  Diagnosis Date  . Asthma   . Constipation   . Generalized headaches   . Other and unspecified ovarian cysts   .  Pulse irregularity   . Rapid heart rate   . Seasonal allergies     Family History  Problem Relation Age of Onset  . Hypertension Mother   . Hypertension Father   . Anxiety disorder Father   . Anxiety disorder Brother   . Depression Brother   . ADD / ADHD Son   . Tourette syndrome Son   . CAD Neg Hx     Social History   Socioeconomic History  . Marital status: Married    Spouse name: Not on file  . Number of children: 2  . Years of education: Not on file  . Highest education level: Not on file  Occupational History  . 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 Financial resource strain: Not on file  . Food insecurity:    Worry: Not on file    Inability: Not on file  . Transportation needs:    Medical: Not on file    Non-medical: Not on file  Tobacco Use  . Smoking status: Never Smoker  . Smokeless tobacco: Never Used  Substance and Sexual Activity  . Alcohol use: Yes    Alcohol/week: 1.0 standard drinks    Types: 1 Standard drinks or equivalent per week    Comment: occasionally  . Drug use: No  . Sexual activity: Not on file  Lifestyle  . Physical activity:    Days per week: Not on file    Minutes per session: Not on file  . Stress: Not on file  Relationships  . Social connections:    Talks on phone: Not on file    Gets together: Not on file    Attends religious service: Not on file    Active member of club or organization: Not on file    Attends meetings of clubs or organizations: Not on file    Relationship status: Not on file  . Intimate partner violence:    Fear of current or ex partner: Not on file    Emotionally abused: Not on file    Physically abused: Not on file    Forced sexual activity: Not on file  Other Topics Concern  . Not on file  Social History Narrative  . Not on file    Past Medical History, Surgical history, Social history, and Family history were reviewed and updated as appropriate.   Please see review of systems for  further details on the patient's review from today.   Objective:   Physical Exam:  BP 104/71   Pulse 88   Physical Exam Constitutional:      General: She is not in acute distress.    Appearance: She is well-developed.  Musculoskeletal:        General: No deformity.  Neurological:     Mental Status: She is alert and oriented to person, place, and time.     Coordination: Coordination normal.  Psychiatric:        Mood and Affect: Mood is not anxious. Affect is blunt. Affect is  not labile, angry or inappropriate.        Speech: Speech normal.        Behavior: Behavior normal.        Thought Content: Thought content normal. Thought content does not include homicidal or suicidal ideation. Thought content does not include homicidal or suicidal plan.        Judgment: Judgment normal.     Comments: Dysthymic mood Insight intact. No auditory or visual hallucinations. No delusions.      Lab Review:     Component Value Date/Time   NA 140 01/26/2018 0937   K 3.6 01/26/2018 0937   CL 105 01/26/2018 0937   CO2 25 01/26/2018 0937   GLUCOSE 100 (H) 01/26/2018 0937   BUN 17 01/26/2018 0937   CREATININE 0.73 01/26/2018 0937   CALCIUM 9.1 01/26/2018 0937   PROT 7.1 11/25/2016 0953   ALBUMIN 4.5 11/25/2016 0953   AST 15 11/25/2016 0953   ALT 15 11/25/2016 0953   ALKPHOS 71 11/25/2016 0953   BILITOT 0.8 11/25/2016 0953   GFRNONAA >90 10/02/2012 0006   GFRAA >90 10/02/2012 0006       Component Value Date/Time   WBC 12.5 (H) 10/02/2012 0006   RBC 4.51 10/02/2012 0006   HGB 14.2 10/02/2012 0006   HCT 38.5 10/02/2012 0006   PLT 214 10/02/2012 0006   MCV 85.4 10/02/2012 0006   MCH 31.5 10/02/2012 0006   MCHC 36.9 (H) 10/02/2012 0006   RDW 12.0 10/02/2012 0006    No results found for: POCLITH, LITHIUM   No results found for: PHENYTOIN, PHENOBARB, VALPROATE, CBMZ   .res Assessment: Plan:   Discussed potential benefits, risks, and side effects of Rexulti. Discussed potential  metabolic side effects associated with atypical antipsychotics, as well as potential risk for movement side effects. Advised pt to contact office if movement side effects occur. Pt provided with voucher for 15 day free trial of Rexulti and samples. Pt advised to contact office if Rexulti is effective and then script will be sent. Will start with low dose Rexulti 0.5 mg po qd considering pt's h/o adverse reactions to medications, particularly with higher doses. Discussed resuming Latuda if unable to tolerate Rexulti.  Continue Trazodone 50 mg 2-3 tabs po QHS for insomnia. Continue Klonopin 0.5 mg po Bid prn anxiety.  Pt to f/u in 6 months or sooner if clinically indicated since pt requests infrequent visits due to financial reasons. Pt reports that she will contact office if she experiences any acute worsening in s/s or adverse effects.   Generalized anxiety disorder - Chronic, not fully controlled - Plan: clonazePAM (KLONOPIN) 0.5 MG tablet  Panic disorder - Chronic, stable - Plan: clonazePAM (KLONOPIN) 0.5 MG tablet  Mood disorder (HCC) - Chronic with current mild depressive s/s. - Plan: lurasidone (LATUDA) 20 MG TABS tablet, Brexpiprazole (REXULTI) 0.5 MG TABS  Insomnia due to other mental disorder - Plan: traZODone (DESYREL) 50 MG tablet, clonazePAM (KLONOPIN) 0.5 MG tablet  Please see After Visit Summary for patient specific instructions.  Future Appointments  Date Time Provider [place]  06/06/2019  8:30 AM Thayer Headings, PMHNP [facility] None    No orders of the defined types were placed in this encounter.     -------------------------------

## 2018-12-19 ENCOUNTER — Telehealth: Payer: Self-pay | Admitting: Psychiatry

## 2018-12-19 NOTE — Telephone Encounter (Signed)
Danielle [name] called to report that the [place] is not working.  It is causing her heart to skip beats about 2 hours after taking it.  So she has stopped taking it.  She is also still weaning off the [place].  Wants to see how she does.  No need to call her unless you have advice on the meds.

## 2019-01-16 ENCOUNTER — Other Ambulatory Visit: Payer: Self-pay | Admitting: Psychiatry

## 2019-01-16 DIAGNOSIS — F5105 Insomnia due to other mental disorder: Secondary | ICD-10-CM

## 2019-01-16 DIAGNOSIS — F99 Mental disorder, not otherwise specified: Principal | ICD-10-CM

## 2019-02-14 DIAGNOSIS — Z6833 Body mass index (BMI) 33.0-33.9, adult: Secondary | ICD-10-CM | POA: Diagnosis not present

## 2019-02-14 DIAGNOSIS — Z01419 Encounter for gynecological examination (general) (routine) without abnormal findings: Secondary | ICD-10-CM | POA: Diagnosis not present

## 2019-03-04 ENCOUNTER — Other Ambulatory Visit: Payer: Self-pay | Admitting: Family Medicine

## 2019-03-04 DIAGNOSIS — J4521 Mild intermittent asthma with (acute) exacerbation: Secondary | ICD-10-CM

## 2019-05-26 ENCOUNTER — Other Ambulatory Visit: Payer: Self-pay | Admitting: Family Medicine

## 2019-05-26 DIAGNOSIS — J4521 Mild intermittent asthma with (acute) exacerbation: Secondary | ICD-10-CM

## 2019-06-06 ENCOUNTER — Ambulatory Visit: Payer: BLUE CROSS/BLUE SHIELD | Admitting: Psychiatry

## 2019-06-18 ENCOUNTER — Other Ambulatory Visit: Payer: Self-pay | Admitting: Psychiatry

## 2019-06-18 DIAGNOSIS — F410 Panic disorder [episodic paroxysmal anxiety] without agoraphobia: Secondary | ICD-10-CM

## 2019-06-18 DIAGNOSIS — F411 Generalized anxiety disorder: Secondary | ICD-10-CM

## 2019-06-18 DIAGNOSIS — N644 Mastodynia: Secondary | ICD-10-CM | POA: Diagnosis not present

## 2019-06-18 DIAGNOSIS — F5105 Insomnia due to other mental disorder: Secondary | ICD-10-CM

## 2019-06-18 DIAGNOSIS — F99 Mental disorder, not otherwise specified: Secondary | ICD-10-CM

## 2019-07-03 ENCOUNTER — Other Ambulatory Visit: Payer: Self-pay | Admitting: Radiology

## 2019-07-03 DIAGNOSIS — N644 Mastodynia: Secondary | ICD-10-CM

## 2019-07-09 ENCOUNTER — Ambulatory Visit
Admission: RE | Admit: 2019-07-09 | Discharge: 2019-07-09 | Disposition: A | Discharge status: Home / Self Care | Payer: BLUE CROSS/BLUE SHIELD | Source: Ambulatory Visit | Attending: Radiology | Admitting: Radiology

## 2019-07-09 ENCOUNTER — Other Ambulatory Visit: Payer: Self-pay

## 2019-07-09 ENCOUNTER — Ambulatory Visit
Admission: RE | Admit: 2019-07-09 | Discharge: 2019-07-09 | Disposition: A | Discharge status: Home / Self Care | Payer: Self-pay | Source: Ambulatory Visit | Attending: Radiology | Admitting: Radiology

## 2019-07-09 ENCOUNTER — Encounter: Payer: Self-pay | Admitting: Psychiatry

## 2019-07-09 ENCOUNTER — Ambulatory Visit (INDEPENDENT_AMBULATORY_CARE_PROVIDER_SITE_OTHER): Payer: BC Managed Care – PPO | Admitting: Psychiatry

## 2019-07-09 DIAGNOSIS — F99 Mental disorder, not otherwise specified: Secondary | ICD-10-CM

## 2019-07-09 DIAGNOSIS — N644 Mastodynia: Secondary | ICD-10-CM

## 2019-07-09 DIAGNOSIS — F410 Panic disorder [episodic paroxysmal anxiety] without agoraphobia: Secondary | ICD-10-CM

## 2019-07-09 DIAGNOSIS — F5105 Insomnia due to other mental disorder: Secondary | ICD-10-CM

## 2019-07-09 DIAGNOSIS — F411 Generalized anxiety disorder: Secondary | ICD-10-CM | POA: Diagnosis not present

## 2019-07-09 DIAGNOSIS — R928 Other abnormal and inconclusive findings on diagnostic imaging of breast: Secondary | ICD-10-CM | POA: Diagnosis not present

## 2019-07-09 MED ORDER — CLONAZEPAM 0.5 MG PO TABS: 0.5000 mg | ORAL_TABLET | Freq: Two times a day (BID) | ORAL | 5 refills | Status: DC | PRN

## 2019-07-09 MED ORDER — GABAPENTIN 100 MG PO CAPS: 100.0000 mg | ORAL_CAPSULE | Freq: Three times a day (TID) | ORAL | 1 refills | Status: DC

## 2019-07-09 MED ORDER — TRAZODONE HCL 50 MG PO TABS: ORAL_TABLET | ORAL | 1 refills | Status: DC

## 2019-07-09 NOTE — Progress Notes (Signed)
Danielle [name] [id number] 12/16/1980 38 y.o.  Subjective:   Patient ID:  Danielle [name] is a 38 y.o. (DOB [birth year]) female.  Chief Complaint:  Chief Complaint  Patient presents with  . Anxiety    HPI Danielle [name] presents to the office today for follow-up of depression and anxiety.  She reports that she has had significant anxiety recently. "I have been pretty much living in fear since around March 12." She reports frequent worry and catastrophic thinking. Reports that she did not leave her home for 5 consecutive weeks. She reports that she has restricted anyone from entering their home since the pandemic began. Reports that she was concerned about possible dependence on Klonopin and has reduced her amount which has also increased her anxiety. She reports that she has had some panic like s/s, particularly when in stores and tries to leave as soon as possible and using grounding techniques. Denies any obsessions or compulsions. Reports that anxiety is worse in the evenings.   She reports that she has had depression a few days and has been manageable overall. She reports that she has had difficulty falling asleep over the last 2-3 weeks and it can take several hours and that Benadryl has not been effective at times. Reports that she started reducing Klonopin about 2 weeks. Has averaged about 6-8 hours a night. Appetite and weight have been stable. Reports that she typically has the most energy and motivation in the morning and then energy decreases througohut the day and "will hit a lull" about 4 pm. Concentration has been ok. Denies SI.   Denies any recent manic or hypomanic s/s.   She reports that she did not work for the first 2 months of the pandemic and then did some of her work over the telephone. 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   Has been taking Klonopin 0.5 mg about 2-3 times a week for the last 2 weeks. She was  previously taking Klonopin 0.5 mg twice daily 2-3 days a week.   Past Psychiatric Medication Trials: Abilify- Tremor Latuda Vraylar- Irritability, Agitation Rexulti- Palpitations Lamictal Viibryd- panic Prozac Effexor XR Zoloft Lexapro Wellbutrin- Headaches Buspar Xanax- excessive somnolence, ineffective Clorazepate- ineffective Klonopin Ativan Trileptal-ineffective Trazodone- Has had dizziness and nausea with doses above 100 mg.  Deplin Propranolol-Ineffective Clonidine   Review of Systems:  Review of Systems  Musculoskeletal: Negative for gait problem.  Neurological: Negative for tremors.  Psychiatric/Behavioral:       Please refer to HPI   Reports that she has a mammogram scheduled today due to breast pain and a lump.  Medications: I have reviewed the patient's current medications.  Current Outpatient Medications  Medication Sig Dispense Refill  . albuterol (VENTOLIN HFA) 108 (90 Base) MCG/ACT inhaler INHALE 2 PUFFS INTO THE LUNGS EVERY 6 (SIX) HOURS AS NEEDED. FOR WHEEZE OR SHORTNESS OF BREATH 8.5 g 0  . BIOTIN PO Take by mouth.    . cetirizine (ZYRTEC) 10 MG tablet Take 10 mg by mouth daily as needed.     Derrill Memo ON 07/17/2019] clonazePAM (KLONOPIN) 0.5 MG tablet Take 1 tablet (0.5 mg total) by mouth 2 (two) times daily as needed for anxiety. 60 tablet 5  . ELDERBERRY PO Take by mouth.    . Multiple Vitamin (MULTIVITAMIN) tablet Take 1 tablet by mouth daily.    . traZODone (DESYREL) 50 MG tablet TAKE 1 TO 3 TABS BY MOUTH  AT BEDTIME AS DIRECTED 90 tablet 1  . vitamin E (VITAMIN E) 400 UNIT capsule Take 400 Units by mouth 2 (two) times a day.    . gabapentin (NEURONTIN) 100 MG capsule Take 1 capsule (100 mg total) by mouth 3 (three) times daily. 90 capsule 1   No current facility-administered medications for this visit.     Medication Side Effects: None  Allergies:  Allergies  Allergen Reactions  . Doxycycline Nausea And Vomiting    Past Medical History:   Diagnosis Date  . Asthma   . Constipation   . Generalized headaches   . Other and unspecified ovarian cysts   . Pulse irregularity   . Rapid heart rate   . Seasonal allergies     Family History  Problem Relation Age of Onset  . Hypertension Mother   . Hypertension Father   . Anxiety disorder Father   . Anxiety disorder Brother   . Depression Brother   . ADD / ADHD Son   . Tourette syndrome Son   . CAD Neg Hx     Social History   Socioeconomic History  . Marital status: Married    Spouse name: Not on file  . Number of children: 2  . Years of education: Not on file  . Highest education level: Not on file  Occupational History  . 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 Financial resource strain: Not on file  . Food insecurity    Worry: Not on file    Inability: Not on file  . Transportation needs    Medical: Not on file    Non-medical: Not on file  Tobacco Use  . Smoking status: Never Smoker  . Smokeless tobacco: Never Used  Substance and Sexual Activity  . Alcohol use: Yes    Alcohol/week: 1.0 standard drinks    Types: 1 Standard drinks or equivalent per week    Comment: occasionally  . Drug use: No  . Sexual activity: Not on file  Lifestyle  . Physical activity    Days per week: Not on file    Minutes per session: Not on file  . Stress: Not on file  Relationships  . Social Herbalist on phone: Not on file    Gets together: Not on file    Attends religious service: Not on file    Active member of club or organization: Not on file    Attends meetings of clubs or organizations: Not on file    Relationship status: Not on file  . Intimate partner violence    Fear of current or ex partner: Not on file    Emotionally abused: Not on file    Physically abused: Not on file    Forced sexual activity: Not on file  Other Topics Concern  . Not on file  Social History Narrative  . Not on file    Past Medical History, Surgical history,  Social history, and Family history were reviewed and updated as appropriate.   Please see review of systems for further details on the patient's review from today.   Objective:   Physical Exam:  LMP 06/22/2019   Physical Exam Constitutional:      General: She is not in acute distress.    Appearance: She is well-developed.  Musculoskeletal:        General: No deformity.  Neurological:     Mental Status: She is alert and oriented to person, place, and time.  Coordination: Coordination normal.  Psychiatric:        Attention and Perception: Attention and perception normal. She does not perceive auditory or visual hallucinations.        Mood and Affect: Mood is anxious. Mood is not depressed. Affect is not labile, blunt, angry or inappropriate.        Speech: Speech normal.        Behavior: Behavior normal.        Thought Content: Thought content normal. Thought content does not include homicidal or suicidal ideation. Thought content does not include homicidal or suicidal plan.        Cognition and Memory: Cognition and memory normal.        Judgment: Judgment normal.     Comments: Insight intact. No delusions.      Lab Review:     Component Value Date/Time   NA 140 01/26/2018 0937   K 3.6 01/26/2018 0937   CL 105 01/26/2018 0937   CO2 25 01/26/2018 0937   GLUCOSE 100 (H) 01/26/2018 0937   BUN 17 01/26/2018 0937   CREATININE 0.73 01/26/2018 0937   CALCIUM 9.1 01/26/2018 0937   PROT 7.1 11/25/2016 0953   ALBUMIN 4.5 11/25/2016 0953   AST 15 11/25/2016 0953   ALT 15 11/25/2016 0953   ALKPHOS 71 11/25/2016 0953   BILITOT 0.8 11/25/2016 0953   GFRNONAA >90 10/02/2012 0006   GFRAA >90 10/02/2012 0006       Component Value Date/Time   WBC 12.5 (H) 10/02/2012 0006   RBC 4.51 10/02/2012 0006   HGB 14.2 10/02/2012 0006   HCT 38.5 10/02/2012 0006   PLT 214 10/02/2012 0006   MCV 85.4 10/02/2012 0006   MCH 31.5 10/02/2012 0006   MCHC 36.9 (H) 10/02/2012 0006   RDW 12.0  10/02/2012 0006    No results found for: POCLITH, LITHIUM   No results found for: PHENYTOIN, PHENOBARB, VALPROATE, CBMZ   .res Assessment: Plan:   Discussed potential benefits, risks, and side effects of gabapentin and discussed that gabapentin is used off label for treatment of anxiety.  Discussed that it has a proposed mechanism of action that is different from the other medications that she has tried and failed for anxiety in the past.  Discussed starting with low dose due to patient's history of medication sensitivity and adverse reactions to multiple medications.  Will therefore start with gabapentin 100 mg 3 times daily for anxiety.  Discussed that further titration of gabapentin may be needed to adequately control anxiety signs and symptoms and dose could be increased once tolerability is established.  Advised patient to contact office if she is tolerating gabapentin without difficulty and is noticing limited improvement with current dose and dose can be increased.. We will continue Klonopin 0.5 mg twice daily as needed for anxiety. Continue trazodone for insomnia and depression. Patient to follow-up in 6 months or sooner if clinically indicated. Patient advised to contact office with any questions, adverse effects, or acute worsening in signs and symptoms.  Danielle [name] was seen today for anxiety.  Diagnoses and all orders for this visit:  Generalized anxiety disorder Comments: Chronic, not fully controlled Orders: -     gabapentin (NEURONTIN) 100 MG capsule; Take 1 capsule (100 mg total) by mouth 3 (three) times daily. -     clonazePAM (KLONOPIN) 0.5 MG tablet; Take 1 tablet (0.5 mg total) by mouth 2 (two) times daily as needed for anxiety.  Panic disorder Comments: Chronic, stable Orders: -  gabapentin (NEURONTIN) 100 MG capsule; Take 1 capsule (100 mg total) by mouth 3 (three) times daily. -     clonazePAM (KLONOPIN) 0.5 MG tablet; Take 1 tablet (0.5 mg total) by mouth 2 (two)  times daily as needed for anxiety.  Insomnia due to other mental disorder -     gabapentin (NEURONTIN) 100 MG capsule; Take 1 capsule (100 mg total) by mouth 3 (three) times daily. -     clonazePAM (KLONOPIN) 0.5 MG tablet; Take 1 tablet (0.5 mg total) by mouth 2 (two) times daily as needed for anxiety. -     traZODone (DESYREL) 50 MG tablet; TAKE 1 TO 3 TABS BY MOUTH AT BEDTIME AS DIRECTED     Please see After Visit Summary for patient specific instructions.  Future Appointments  Date Time Provider [place]  01/09/2020  8:30 AM Thayer Headings, PMHNP [facility] None    No orders of the defined types were placed in this encounter.   -------------------------------

## 2019-08-12 ENCOUNTER — Telehealth: Payer: Self-pay | Admitting: Psychiatry

## 2019-08-12 NOTE — Telephone Encounter (Signed)
Danielle [name] called to report that she hasn't had any benefit from the gabapentin 100mg .  So she has increased to 200mg  TID.  It has only been a few days so doesn't know yet if that will help, but wanted you to know she had done this.  Next appt 01/09/20

## 2019-08-13 NOTE — Telephone Encounter (Signed)
Pt. Made aware and verbalized understanding.

## 2019-08-20 ENCOUNTER — Other Ambulatory Visit: Payer: Self-pay

## 2019-08-20 ENCOUNTER — Telehealth: Payer: Self-pay | Admitting: Psychiatry

## 2019-08-20 DIAGNOSIS — F99 Mental disorder, not otherwise specified: Secondary | ICD-10-CM

## 2019-08-20 DIAGNOSIS — F411 Generalized anxiety disorder: Secondary | ICD-10-CM

## 2019-08-20 DIAGNOSIS — F5105 Insomnia due to other mental disorder: Secondary | ICD-10-CM

## 2019-08-20 DIAGNOSIS — F410 Panic disorder [episodic paroxysmal anxiety] without agoraphobia: Secondary | ICD-10-CM

## 2019-08-20 MED ORDER — GABAPENTIN 300 MG PO CAPS: 300.0000 mg | ORAL_CAPSULE | Freq: Three times a day (TID) | ORAL | 1 refills | Status: DC

## 2019-08-20 NOTE — Telephone Encounter (Signed)
Patient needs more of the Gabapentin 300 mg., 3x a day.  Patient can't tell if medication is helping with anxiety, bc she's trying not to take the Clonopin as much.

## 2019-08-20 NOTE — Telephone Encounter (Signed)
Will notify provider pt requesting dose increase.

## 2019-09-04 ENCOUNTER — Other Ambulatory Visit: Payer: Self-pay | Admitting: Psychiatry

## 2019-09-04 DIAGNOSIS — F99 Mental disorder, not otherwise specified: Secondary | ICD-10-CM

## 2019-09-04 DIAGNOSIS — F5105 Insomnia due to other mental disorder: Secondary | ICD-10-CM

## 2019-09-04 DIAGNOSIS — F411 Generalized anxiety disorder: Secondary | ICD-10-CM

## 2019-09-04 DIAGNOSIS — F410 Panic disorder [episodic paroxysmal anxiety] without agoraphobia: Secondary | ICD-10-CM

## 2019-10-06 ENCOUNTER — Other Ambulatory Visit: Payer: Self-pay | Admitting: Psychiatry

## 2019-10-06 DIAGNOSIS — F411 Generalized anxiety disorder: Secondary | ICD-10-CM

## 2019-10-06 DIAGNOSIS — F99 Mental disorder, not otherwise specified: Secondary | ICD-10-CM

## 2019-10-06 DIAGNOSIS — F5105 Insomnia due to other mental disorder: Secondary | ICD-10-CM

## 2019-10-06 DIAGNOSIS — F410 Panic disorder [episodic paroxysmal anxiety] without agoraphobia: Secondary | ICD-10-CM

## 2019-10-29 ENCOUNTER — Ambulatory Visit (INDEPENDENT_AMBULATORY_CARE_PROVIDER_SITE_OTHER): Payer: BC Managed Care – PPO | Admitting: Family Medicine

## 2019-10-29 ENCOUNTER — Encounter: Payer: Self-pay | Admitting: Family Medicine

## 2019-10-29 ENCOUNTER — Other Ambulatory Visit: Payer: Self-pay

## 2019-10-29 VITALS — BP 126/70 | HR 91 | Temp 97.5°F | Resp 12 | Ht 62.0 in | Wt 168.4 lb

## 2019-10-29 DIAGNOSIS — Z13228 Encounter for screening for other metabolic disorders: Secondary | ICD-10-CM

## 2019-10-29 DIAGNOSIS — J4521 Mild intermittent asthma with (acute) exacerbation: Secondary | ICD-10-CM | POA: Diagnosis not present

## 2019-10-29 DIAGNOSIS — Z130 Encounter for screening for diseases of the blood and blood-forming organs and certain disorders involving the immune mechanism: Secondary | ICD-10-CM

## 2019-10-29 DIAGNOSIS — F39 Unspecified mood [affective] disorder: Secondary | ICD-10-CM

## 2019-10-29 DIAGNOSIS — Z111 Encounter for screening for respiratory tuberculosis: Secondary | ICD-10-CM

## 2019-10-29 DIAGNOSIS — Z13220 Encounter for screening for lipoid disorders: Secondary | ICD-10-CM | POA: Diagnosis not present

## 2019-10-29 DIAGNOSIS — Z1329 Encounter for screening for other suspected endocrine disorder: Secondary | ICD-10-CM | POA: Diagnosis not present

## 2019-10-29 DIAGNOSIS — Z0000 Encounter for general adult medical examination without abnormal findings: Secondary | ICD-10-CM

## 2019-10-29 LAB — BASIC METABOLIC PANEL
BUN: 10 mg/dL (ref 6–23)
CO2: 24 mEq/L (ref 19–32)
Calcium: 9.4 mg/dL (ref 8.4–10.5)
Chloride: 105 mEq/L (ref 96–112)
Creatinine, Ser: 0.62 mg/dL (ref 0.40–1.20)
GFR: 106.63 mL/min (ref >60.00)
Glucose, Bld: 98 mg/dL (ref 70–99)
Potassium: 3.9 mEq/L (ref 3.5–5.1)
Sodium: 138 mEq/L (ref 135–145)

## 2019-10-29 LAB — LIPID PANEL
Cholesterol: 192 mg/dL (ref 0–200)
HDL: 45.5 mg/dL (ref >39.00)
LDL Cholesterol: 123 mg/dL — ABNORMAL HIGH (ref 0–99)
NonHDL: 146.97
Total CHOL/HDL Ratio: 4
Triglycerides: 121 mg/dL (ref 0.0–149.0)
VLDL: 24.2 mg/dL (ref 0.0–40.0)

## 2019-10-29 LAB — HEMOGLOBIN A1C: Hgb A1c MFr Bld: 4.6 % (ref 4.6–6.5)

## 2019-10-29 MED ORDER — ALBUTEROL SULFATE HFA 108 (90 BASE) MCG/ACT IN AERS: 2.0000 | INHALATION_SPRAY | Freq: Four times a day (QID) | RESPIRATORY_TRACT | 0 refills | Status: DC | PRN

## 2019-10-29 NOTE — Patient Instructions (Signed)
Today you have you routine preventive visit. A few things to remember from today's visit:   Routine general medical examination at a health care facility  Mild intermittent asthma with acute exacerbation  Screening for lipoid disorders - Plan: Lipid panel  Screening for endocrine, metabolic and immunity disorder - Plan: Basic metabolic panel, Hemoglobin A1c    At least 150 minutes of moderate exercise per week, daily brisk walking for 15-30 min is a good exercise option. Healthy diet low in saturated (animal) fats and sweets and consisting of fresh fruits and vegetables, lean meats such as fish and white chicken and whole grains.  These are some of recommendations for screening depending of age and risk factors:   - Vaccines:  Tdap vaccine every 10 years.  Shingles vaccine recommended at age 21, could be given after 40 years of age but not sure about insurance coverage.   Pneumonia vaccines:  Pneumovax at 73. Sometimes Pneumovax is giving earlier if history of smoking, lung disease,diabetes,kidney disease among some.    Screening for diabetes at age 62 and every 3 years.  Cervical cancer prevention:  Pap smear starts at 40 years of age and continues periodically until 40 years old in low risk women. Pap smear every 3 years between 82 and 59 years old. Pap smear every 3-5 years between women 34 and older if pap smear negative and HPV screening negative.   -Breast cancer: Mammogram: There is disagreement between experts about when to start screening in low risk asymptomatic female but recent recommendations are to start screening at 57 and not later than 40 years old , every 1-2 years and after 40 yo q 2 years. Screening is recommended until 40 years old but some women can continue screening depending of healthy issues.   Colon cancer screening: starts at 40 years old until 40 years old.  Cholesterol disorder screening at age 58 and every 3 years.  Also  recommended:  1. Dental visit- Brush and floss your teeth twice daily; visit your dentist twice a year. 2. Eye doctor- Get an eye exam at least every 2 years. 3. Helmet use- Always wear a helmet when riding a bicycle, motorcycle, rollerblading or skateboarding. 4. Safe sex- If you may be exposed to sexually transmitted infections, use a condom. 5. Seat belts- Seat belts can save your live; always wear one. 6. Smoke/Carbon Monoxide detectors- These detectors need to be installed on the appropriate level of your home. Replace batteries at least once a year. 7. Skin cancer- When out in the sun please cover up and use sunscreen 15 SPF or higher. 8. Violence- If anyone is threatening or hurting you, please tell your healthcare provider.  9. Drink alcohol in moderation- Limit alcohol intake to one drink or less per day. Never drink and drive.

## 2019-10-29 NOTE — Progress Notes (Signed)
HPI:   Danielle [name] is a 38 y.o. female, who is here today for her routine physical.  Last CPE: 01/26/2018.  Regular exercise 3 or more time per week: Not consistently since COVID-19 pandemia started. Following a healthful diet: She is "trying" to follow a healthier diet, still drinking 3 regular sodas daily  Frustrated about weight gain despite the fact she is smaller portions.  She attributes weight gain to her psychiatric medications. She lives with her husband and 2 children.  Chronic medical problems: Asthma, allergic rhinitis, GERD, anxiety and move disorder.  Pap smear: 01/2018. She was evaluated by her gynecologist due to left breast tenderness. Mammogram done on 07/09/2019 was BI-RADS 1.  She is still having some tenderness, no nipple discharge, masses, or skin changes.  Immunization History  Administered Date(s) Administered  . Influenza-Unspecified 08/31/2017  . PPD Test 11/25/2016, 11/29/2017, 11/14/2018, 10/29/2019  . Tdap 11/25/2016    She has new concerns today. Asthma: Currently she is on albuterol inhaler, which she does not need frequently.  Review of Systems  Constitutional: Negative for appetite change,and fever. + Fatigue. HENT: Negative for dental problem, hearing loss, mouth sores, sore throat, trouble swallowing and voice change.  + Epiphora.  Negative for conjunctival erythema, purulent drainage, or pruritus. Eyes: Negative for redness and visual disturbance.  Respiratory: Negative for cough, shortness of breath and wheezing.   Cardiovascular: Negative for chest pain and leg swelling.  Gastrointestinal: Negative for abdominal pain, nausea and vomiting.       No changes in bowel habits.  Endocrine: Negative for cold intolerance, heat intolerance, polydipsia, polyphagia and polyuria.  Genitourinary: Negative for decreased urine volume, dysuria, hematuria, vaginal bleeding and vaginal discharge.  Musculoskeletal: Negative for arthralgias,  gait problem and myalgias.  Skin: Negative for color change and rash.  Allergic/Immunologic: Positive for environmental allergies.  Neurological: Negative for syncope, weakness and headaches.  Hematological: Negative for adenopathy. Does not bruise/bleed easily.  Psychiatric/Behavioral: Negative for confusion and sleep disturbance. The patient is nervous/anxious.   All other systems reviewed and are negative.   Current Outpatient Medications on File Prior to Visit  Medication Sig Dispense Refill  . BIOTIN PO Take by mouth.    . cetirizine (ZYRTEC) 10 MG tablet Take 10 mg by mouth daily as needed.     Marland Kitchen ELDERBERRY PO Take by mouth.    . gabapentin (NEURONTIN) 300 MG capsule TAKE 1 CAPSULE BY MOUTH THREE TIMES A DAY 90 capsule 1  . Multiple Vitamin (MULTIVITAMIN) tablet Take 1 tablet by mouth daily.    Marland Kitchen omeprazole (PRILOSEC) 20 MG capsule Take 20 mg by mouth daily.    . traZODone (DESYREL) 50 MG tablet TAKE 1 TO 3 TABS BY MOUTH AT BEDTIME AS DIRECTED 90 tablet 1  . vitamin E (VITAMIN E) 400 UNIT capsule Take 400 Units by mouth 2 (two) times a day.    . clonazePAM (KLONOPIN) 0.5 MG tablet Take 1 tablet (0.5 mg total) by mouth 2 (two) times daily as needed for anxiety. 60 tablet 5   No current facility-administered medications on file prior to visit.      Past Medical History:  Diagnosis Date  . Asthma   . Constipation   . Generalized headaches   . Other and unspecified ovarian cysts   . Pulse irregularity   . Rapid heart rate   . Seasonal allergies     Past Surgical History:  Procedure Laterality Date  . DILATION AND CURETTAGE OF  UTERUS  2005    Allergies  Allergen Reactions  . Doxycycline Nausea And Vomiting    Family History  Problem Relation Age of Onset  . Hypertension Mother   . Hypertension Father   . Anxiety disorder Father   . Anxiety disorder Brother   . Depression Brother   . ADD / ADHD Son   . Tourette syndrome Son   . CAD Neg Hx     Social History    Socioeconomic History  . Marital status: Married    Spouse name: Not on file  . Number of children: 2  . Years of education: Not on file  . Highest education level: Not on file  Occupational History  . 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 Financial resource strain: Not on file  . Food insecurity    Worry: Not on file    Inability: Not on file  . Transportation needs    Medical: Not on file    Non-medical: Not on file  Tobacco Use  . Smoking status: Never Smoker  . Smokeless tobacco: Never Used  Substance and Sexual Activity  . Alcohol use: Yes    Alcohol/week: 1.0 standard drinks    Types: 1 Standard drinks or equivalent per week    Comment: occasionally  . Drug use: No  . Sexual activity: Not on file  Lifestyle  . Physical activity    Days per week: Not on file    Minutes per session: Not on file  . Stress: Not on file  Relationships  . Social Herbalist on phone: Not on file    Gets together: Not on file    Attends religious service: Not on file    Active member of club or organization: Not on file    Attends meetings of clubs or organizations: Not on file    Relationship status: Not on file  Other Topics Concern  . Not on file  Social History Narrative  . Not on file     Vitals:   10/29/19 0903  BP: 126/70  Pulse: 91  Resp: 12  Temp: (!) 97.5 F (36.4 C)  SpO2: 98%   Body mass index is 30.8 kg/m.   Wt Readings from Last 3 Encounters:  10/29/19 168 lb 6.4 oz (76.4 kg)  01/26/18 173 lb 8 oz (78.7 kg)  01/24/18 175 lb 12.8 oz (79.7 kg)   Physical Exam  Nursing note and vitals reviewed. Constitutional: She is oriented to person, place, and time. She appears well-developed and in no distress.  HENT:  Head: Normocephalic and atraumatic.  Right Ear: Hearing, tympanic membrane, external ear and ear canal normal.  Left Ear: Hearing, tympanic membrane, external ear and ear canal normal.  Mouth/Throat: Uvula is midline, oropharynx  is clear and moist and mucous membranes are normal.  Eyes: Pupils are equal, round, and reactive to light. Conjunctivae and EOM are normal.  Neck: No tracheal deviation present. No thyromegaly present.  Cardiovascular: Normal rate and regular rhythm.  No murmur heard. Pulses:      Dorsalis pedis pulses are 2+ on the right side, and 2+ on the left side.  Respiratory: Effort normal and breath sounds normal. No respiratory distress.  GI: Soft. She exhibits no mass. There is no hepatomegaly. There is no tenderness.  Genitourinary:Comments: Deferred to gyn.  Musculoskeletal: She exhibits no edema.  No major deformity or signs of synovitis appreciated.  Lymphadenopathy:    She has no cervical adenopathy.  Right: No supraclavicular adenopathy present.       Left: No supraclavicular adenopathy present.  Neurological: She is alert and oriented to person, place, and time. She has normal strength. No cranial nerve deficit. Coordination and gait normal.  Reflex Scores:      Bicep reflexes are 2+ on the right side and 2+ on the left side.      Patellar reflexes are 2+ on the right side and 2+ on the left side. Skin: Skin is warm. No rash noted. No erythema.  Psychiatric: She has a normal mood and affect. Cognitive function grossly intact. Well groomed, good eye contact.   ASSESSMENT AND PLAN:  [name] was here today annual physical examination.   Orders Placed This Encounter  Procedures  . Basic metabolic panel  . Lipid panel  . Hemoglobin A1c  . PPD    Lab Results  Component Value Date   HGBA1C 4.6 10/29/2019   Lab Results  Component Value Date   CHOL 192 10/29/2019   HDL 45.50 10/29/2019   LDLCALC 123 (H) 10/29/2019   TRIG 121.0 10/29/2019   CHOLHDL 4 10/29/2019   Lab Results  Component Value Date   CREATININE 0.62 10/29/2019   BUN 10 10/29/2019   NA 138 10/29/2019   K 3.9 10/29/2019   CL 105 10/29/2019   CO2 24 10/29/2019    Routine general medical  examination at a health care facility We discussed the importance of regular physical activity and healthy diet for prevention of chronic illness and/or complications. Preventive guidelines reviewed. Vaccination up to date. She will continue following with gynecologist for her female preventive care.  Next CPE in a year.  Mild intermittent asthma with acute exacerbation Problem is well controlled. No changes in current management.  -     albuterol (VENTOLIN HFA) 108 (90 Base) MCG/ACT inhaler; Inhale 2 puffs into the lungs every 6 (six) hours as needed. For wheeze or shortness of breath  Screening for lipoid disorders -     Lipid panel  Screening for endocrine, metabolic and immunity disorder -     Basic metabolic panel -     Hemoglobin A1c  Tuberculosis screening -     PPD  Mood disorder ([place]) Problem is better controlled. We discussed some side effects of medications, including weight gain. Continue follow-up with psychiatrist.   Return in 1 year (on 10/28/2020) for cpe.    Betty G. Martinique, MD  [facility]. [place] office.

## 2019-10-31 ENCOUNTER — Encounter: Payer: Self-pay | Admitting: Family Medicine

## 2019-10-31 LAB — TB SKIN TEST
Induration: 0 mm
TB Skin Test: NEGATIVE

## 2019-11-28 ENCOUNTER — Other Ambulatory Visit: Payer: Self-pay | Admitting: Psychiatry

## 2019-11-28 DIAGNOSIS — F410 Panic disorder [episodic paroxysmal anxiety] without agoraphobia: Secondary | ICD-10-CM

## 2019-11-28 DIAGNOSIS — F411 Generalized anxiety disorder: Secondary | ICD-10-CM

## 2019-11-28 DIAGNOSIS — F5105 Insomnia due to other mental disorder: Secondary | ICD-10-CM

## 2019-12-03 ENCOUNTER — Other Ambulatory Visit: Payer: Self-pay | Admitting: Family Medicine

## 2019-12-03 DIAGNOSIS — J4521 Mild intermittent asthma with (acute) exacerbation: Secondary | ICD-10-CM

## 2019-12-24 ENCOUNTER — Other Ambulatory Visit: Payer: BC Managed Care – PPO

## 2020-01-09 ENCOUNTER — Other Ambulatory Visit: Payer: Self-pay

## 2020-01-09 ENCOUNTER — Encounter: Payer: Self-pay | Admitting: Psychiatry

## 2020-01-09 ENCOUNTER — Ambulatory Visit (INDEPENDENT_AMBULATORY_CARE_PROVIDER_SITE_OTHER): Payer: BC Managed Care – PPO | Admitting: Psychiatry

## 2020-01-09 DIAGNOSIS — F410 Panic disorder [episodic paroxysmal anxiety] without agoraphobia: Secondary | ICD-10-CM

## 2020-01-09 DIAGNOSIS — F411 Generalized anxiety disorder: Secondary | ICD-10-CM | POA: Diagnosis not present

## 2020-01-09 DIAGNOSIS — F5105 Insomnia due to other mental disorder: Secondary | ICD-10-CM

## 2020-01-09 DIAGNOSIS — F99 Mental disorder, not otherwise specified: Secondary | ICD-10-CM | POA: Diagnosis not present

## 2020-01-09 MED ORDER — GABAPENTIN 300 MG PO CAPS: 300.0000 mg | ORAL_CAPSULE | Freq: Two times a day (BID) | ORAL | 1 refills | Status: DC

## 2020-01-09 MED ORDER — TRAZODONE HCL 50 MG PO TABS: ORAL_TABLET | ORAL | 1 refills | Status: DC

## 2020-01-09 MED ORDER — CLONAZEPAM 0.5 MG PO TABS: 0.5000 mg | ORAL_TABLET | Freq: Two times a day (BID) | ORAL | 5 refills | Status: DC | PRN

## 2020-01-09 NOTE — Progress Notes (Signed)
Danielle [name] BK:[id number] 1981/07/15 39 y.o.  Subjective:   Patient ID:  Danielle [name] is a 39 y.o. (DOB 31-Jul-1981) female.  Chief Complaint:  Chief Complaint  Patient presents with  . Anxiety  . Depression    HPI Danielle [name] presents to the office today for follow-up of depression and anxiety. She reports that she continues to experience significant anxiety, "like I am living in fear."  She reports anxiety with transition from son going remote learning to on site since this triggers feelings of decreased control. Reports that she recognizes son would likely do better on site with ADHD and Tourette's. She als o has anxiety about son's exposure risk. "I still am functional." She reports that she is starting to miss things that she enjoyed before the pandemic. Denies persistent depression. "This feels situational to me." Reports that energy and motivation is fair and is able to get things done that she needs to do and then will have very low energy at the end of the day. Sleeping well with occ difficulty falling asleep. Appetite has been ok. Reports that she is no longer gaining wt and that this has plateaued. No longer going to the gym. Notices that she has some difficulty with concentration and notices that she will have to read something more than once. Occasionally not finding the right word. Denies SI.   Denies any manic s/s to include impulsive or risky behavior. Denies excessive spending.   Daughter who is 40 yo has been "struggling" after end of 1.5 year relationship. Daughter has had some recent anxiety s/s and they are getting her tx.   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 She will occasionally see patients a few times a week and mostly works from home.   Reports that she has been trying not to take Klonopin very often and taking 1-2 times a week.  Notices certain people and being further from home has triggered anxiety. Typically taking  Trazodone 1-2 tabs po QHS.   Received first vaccine on Monday. She had some anxiety about receiving it.   Past Psychiatric Medication Trials: Abilify- Tremor Latuda Vraylar- Irritability, Agitation Rexulti- Palpitations Lamictal Viibryd- panic Prozac Effexor XR Zoloft Lexapro Wellbutrin- Headaches Buspar Xanax- excessive somnolence, ineffective Clorazepate- ineffective Klonopin Ativan Trileptal-ineffective Trazodone- Has had dizziness and nausea with doses above 100 mg.  Deplin Propranolol-Ineffective Clonidine Gabapentin- Noticed restlessness with 900 mg.     PHQ2-9     Office Visit from 01/10/2017 in Primary Care at [facility] from 10/07/2016 in Primary Care at [facility] from 05/13/2016 in Primary Care at [facility] Total Score  0  1  0       Review of Systems:  Review of Systems  Gastrointestinal: Negative.   Musculoskeletal: Negative for gait problem.  Neurological: Positive for headaches. Negative for tremors.  Psychiatric/Behavioral:       Please refer to HPI    Had physical exam in November .   Medications: I have reviewed the patient's current medications.  Current Outpatient Medications  Medication Sig Dispense Refill  . albuterol (VENTOLIN HFA) 108 (90 Base) MCG/ACT inhaler INHALE 2 PUFFS INTO THE LUNGS EVERY 6 (SIX) HOURS AS NEEDED. FOR WHEEZE OR SHORTNESS OF BREATH 8.5 g 2  . BIOTIN PO Take by mouth.    . cetirizine (ZYRTEC) 10 MG tablet Take 10 mg by mouth daily as needed.     Marland Kitchen ELDERBERRY PO Take by mouth.    Marland Kitchen  Multiple Vitamin (MULTIVITAMIN) tablet Take 1 tablet by mouth daily.    Marland Kitchen omeprazole (PRILOSEC) 20 MG capsule Take 20 mg by mouth daily.    Derrill Memo ON 01/14/2020] clonazePAM (KLONOPIN) 0.5 MG tablet Take 1 tablet (0.5 mg total) by mouth 2 (two) times daily as needed for anxiety. 60 tablet 5  . gabapentin (NEURONTIN) 300 MG capsule Take 1 capsule (300 mg total) by mouth 2 (two) times daily. 90 capsule 1  . traZODone  (DESYREL) 50 MG tablet TAKE 1 TO 3 TABS BY MOUTH AT BEDTIME AS DIRECTED 90 tablet 1   No current facility-administered medications for this visit.    Medication Side Effects: None  Allergies:  Allergies  Allergen Reactions  . Doxycycline Nausea And Vomiting    Past Medical History:  Diagnosis Date  . Asthma   . Constipation   . Generalized headaches   . Other and unspecified ovarian cysts   . Pulse irregularity   . Rapid heart rate   . Seasonal allergies     Family History  Problem Relation Age of Onset  . Hypertension Mother   . Hypertension Father   . Anxiety disorder Father   . Anxiety disorder Brother   . Depression Brother   . ADD / ADHD Son   . Tourette syndrome Son   . CAD Neg Hx     Social History   Socioeconomic History  . Marital status: Married    Spouse name: Not on file  . Number of children: 2  . Years of education: Not on file  . Highest education level: Not on file  Occupational History  . 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 Smoking status: Never Smoker  . Smokeless tobacco: Never Used  Substance and Sexual Activity  . Alcohol use: Yes    Alcohol/week: 1.0 standard drinks    Types: 1 Standard drinks or equivalent per week    Comment: occasionally  . Drug use: No  . Sexual activity: Not on file  Other Topics Concern  . Not on file  Social History Narrative  . Not on file   Social Determinants of Health   Financial Resource Strain:   . Difficulty of Paying Living Expenses: Not on file  Food Insecurity:   . Worried About Charity fundraiser in the Last Year: Not on file  . Ran Out of Food in the Last Year: Not on file  Transportation Needs:   . Lack of Transportation (Medical): Not on file  . Lack of Transportation (Non-Medical): Not on file  Physical Activity:   . Days of Exercise per Week: Not on file  . Minutes of Exercise per Session: Not on file  Stress:   . Feeling of Stress : Not on file  Social Connections:    . Frequency of Communication with Friends and Family: Not on file  . Frequency of Social Gatherings with Friends and Family: Not on file  . Attends Religious Services: Not on file  . Active Member of Clubs or Organizations: Not on file  . Attends Archivist Meetings: Not on file  . Marital Status: Not on file  Intimate Partner Violence:   . Fear of Current or Ex-Partner: Not on file  . Emotionally Abused: Not on file  . Physically Abused: Not on file  . Sexually Abused: Not on file    Past Medical History, Surgical history, Social history, and Family history were reviewed and updated as appropriate.   Please  see review of systems for further details on the patient's review from today.   Objective:   Physical Exam:  Pulse 100   Wt 168 lb (76.2 kg)   BMI 30.73 kg/m   Physical Exam Constitutional:      General: She is not in acute distress.    Appearance: She is well-developed.  Musculoskeletal:        General: No deformity.  Neurological:     Mental Status: She is alert and oriented to person, place, and time.     Coordination: Coordination normal.  Psychiatric:        Attention and Perception: Attention and perception normal. She does not perceive auditory or visual hallucinations.        Mood and Affect: Mood is anxious. Mood is not depressed. Affect is not labile, blunt, angry or inappropriate.        Speech: Speech normal.        Behavior: Behavior normal.        Thought Content: Thought content normal. Thought content is not paranoid or delusional. Thought content does not include homicidal or suicidal ideation. Thought content does not include homicidal or suicidal plan.        Cognition and Memory: Cognition and memory normal.        Judgment: Judgment normal.     Comments: Insight intact Mood is overall less anxious compared to past exams     Lab Review:     Component Value Date/Time   NA 138 10/29/2019 0945   K 3.9 10/29/2019 0945   CL 105  10/29/2019 0945   CO2 24 10/29/2019 0945   GLUCOSE 98 10/29/2019 0945   BUN 10 10/29/2019 0945   CREATININE 0.62 10/29/2019 0945   CALCIUM 9.4 10/29/2019 0945   PROT 7.1 11/25/2016 0953   ALBUMIN 4.5 11/25/2016 0953   AST 15 11/25/2016 0953   ALT 15 11/25/2016 0953   ALKPHOS 71 11/25/2016 0953   BILITOT 0.8 11/25/2016 0953   GFRNONAA >90 10/02/2012 0006   GFRAA >90 10/02/2012 0006       Component Value Date/Time   WBC 12.5 (H) 10/02/2012 0006   RBC 4.51 10/02/2012 0006   HGB 14.2 10/02/2012 0006   HCT 38.5 10/02/2012 0006   PLT 214 10/02/2012 0006   MCV 85.4 10/02/2012 0006   MCH 31.5 10/02/2012 0006   MCHC 36.9 (H) 10/02/2012 0006   RDW 12.0 10/02/2012 0006    No results found for: POCLITH, LITHIUM   No results found for: PHENYTOIN, PHENOBARB, VALPROATE, CBMZ   .res Assessment: Plan:   Patient seen for 30 minutes and time spent discussing current symptoms and stressors, as well as response to medication.  Patient reports that she is unclear regarding response to gabapentin, however she has not had any recent panic attacks and has been able to gradually reduce Klonopin, and therefore suspects that gabapentin may be helping somewhat with anxiety signs and symptoms.  Also discussed that patient's overall level of anxiety has been less despite several current stressors.  Patient reports that she would prefer to continue current medications without changes. Will continue gabapentin 300 mg twice daily for anxiety. Continue trazodone 50 mg 2-3 tabs p.o. nightly for depression and insomnia. Continue Klonopin 0.5 mg twice daily as needed for anxiety. Patient to follow-up in 6 months or sooner if clinically indicated. Patient advised to contact office with any questions, adverse effects, or acute worsening in signs and symptoms.  Danielle [name] was seen today for anxiety and  depression.  Diagnoses and all orders for this visit:  Generalized anxiety disorder -     gabapentin (NEURONTIN)  300 MG capsule; Take 1 capsule (300 mg total) by mouth 2 (two) times daily. -     clonazePAM (KLONOPIN) 0.5 MG tablet; Take 1 tablet (0.5 mg total) by mouth 2 (two) times daily as needed for anxiety.  Panic disorder -     gabapentin (NEURONTIN) 300 MG capsule; Take 1 capsule (300 mg total) by mouth 2 (two) times daily. -     clonazePAM (KLONOPIN) 0.5 MG tablet; Take 1 tablet (0.5 mg total) by mouth 2 (two) times daily as needed for anxiety.  Insomnia due to other mental disorder -     gabapentin (NEURONTIN) 300 MG capsule; Take 1 capsule (300 mg total) by mouth 2 (two) times daily. -     traZODone (DESYREL) 50 MG tablet; TAKE 1 TO 3 TABS BY MOUTH AT BEDTIME AS DIRECTED -     clonazePAM (KLONOPIN) 0.5 MG tablet; Take 1 tablet (0.5 mg total) by mouth 2 (two) times daily as needed for anxiety.     Please see After Visit Summary for patient specific instructions.  Future Appointments  Date Time Provider [place]  07/08/2020  8:00 AM Thayer Headings, PMHNP CP-CP None    No orders of the defined types were placed in this encounter.   -------------------------------

## 2020-03-17 DIAGNOSIS — Z6830 Body mass index (BMI) 30.0-30.9, adult: Secondary | ICD-10-CM | POA: Diagnosis not present

## 2020-03-17 DIAGNOSIS — Z01419 Encounter for gynecological examination (general) (routine) without abnormal findings: Secondary | ICD-10-CM | POA: Diagnosis not present

## 2020-03-23 ENCOUNTER — Ambulatory Visit (INDEPENDENT_AMBULATORY_CARE_PROVIDER_SITE_OTHER): Payer: BC Managed Care – PPO | Admitting: Family Medicine

## 2020-03-23 ENCOUNTER — Ambulatory Visit: Payer: BC Managed Care – PPO | Admitting: Family Medicine

## 2020-03-23 ENCOUNTER — Other Ambulatory Visit: Payer: Self-pay

## 2020-03-23 ENCOUNTER — Encounter: Payer: Self-pay | Admitting: Family Medicine

## 2020-03-23 VITALS — BP 110/76 | HR 84 | Temp 98.0°F | Resp 12 | Ht 62.0 in | Wt 169.0 lb

## 2020-03-23 DIAGNOSIS — M545 Low back pain, unspecified: Secondary | ICD-10-CM

## 2020-03-23 MED ORDER — PREDNISONE 20 MG PO TABS: ORAL_TABLET | ORAL | 0 refills | Status: AC

## 2020-03-23 MED ORDER — TIZANIDINE HCL 4 MG PO TABS: 2.0000 mg | ORAL_TABLET | Freq: Three times a day (TID) | ORAL | 0 refills | Status: AC | PRN

## 2020-03-23 NOTE — Progress Notes (Signed)
ACUTE VISIT     Chief Complaint  Patient presents with  . Back Pain    lower right side started 2 weeks ago  . Hip Pain   HPI: Danielle [name] is a 39 y.o. female, who is here today complaining of right-sided intermittent low back pain. Gradually getting worse. It has been constant since yesterday after riding her bike.  Negative for any recent injury or unusual level of activity.  Pain is sometimes radiated to mid buttock and twice to mid posterior thigh. Sharp, 6/10 in intensity, with no associated LE numbness, tingling, urinary incontinence or retention, stool incontinence, or saddle anesthesia.  Exacerbated by standing up after prolonged sitting ,twisting , and turning. No alleviating factors identified. No rash or edema on area, fever, chills, or abnormal wt loss.  Prior Hx of back pain: Denies.   OTC medications: Ibuprofen 600 mg and Tylenol,neither one helps.  Review of Systems  Constitutional: Negative for appetite change and fatigue.  Respiratory: Negative for cough, shortness of breath and wheezing.   Cardiovascular: Negative for leg swelling.  Gastrointestinal: Negative for abdominal pain, nausea and vomiting.       Negative for changes in bowel habits.  Genitourinary: Negative for decreased urine volume, dysuria and hematuria.  Musculoskeletal: Negative for gait problem and joint swelling.  Skin: Negative for rash.  Neurological: Negative for weakness and headaches.  Rest see pertinent positives and negatives per HPI.  Current Outpatient Medications on File Prior to Visit  Medication Sig Dispense Refill  . albuterol (VENTOLIN HFA) 108 (90 Base) MCG/ACT inhaler INHALE 2 PUFFS INTO THE LUNGS EVERY 6 (SIX) HOURS AS NEEDED. FOR WHEEZE OR SHORTNESS OF BREATH 8.5 g 2  . BIOTIN PO Take by mouth.    . cetirizine (ZYRTEC) 10 MG tablet Take 10 mg by mouth daily as needed.     Marland Kitchen ELDERBERRY PO Take by mouth.    . gabapentin (NEURONTIN) 300 MG capsule  Take 1 capsule (300 mg total) by mouth 2 (two) times daily. 90 capsule 1  . Multiple Vitamin (MULTIVITAMIN) tablet Take 1 tablet by mouth daily.    Marland Kitchen omeprazole (PRILOSEC) 20 MG capsule Take 20 mg by mouth daily.    . traZODone (DESYREL) 50 MG tablet TAKE 1 TO 3 TABS BY MOUTH AT BEDTIME AS DIRECTED 90 tablet 1  . clonazePAM (KLONOPIN) 0.5 MG tablet Take 1 tablet (0.5 mg total) by mouth 2 (two) times daily as needed for anxiety. 60 tablet 5   No current facility-administered medications on file prior to visit.   Past Medical History:  Diagnosis Date  . Asthma   . Constipation   . Generalized headaches   . Other and unspecified ovarian cysts   . Pulse irregularity   . Rapid heart rate   . Seasonal allergies    Allergies  Allergen Reactions  . Doxycycline Nausea And Vomiting    Other reaction(s): Vomiting    Social History   Socioeconomic History  . Marital status: Married    Spouse name: Not on file  . Number of children: 2  . Years of education: Not on file  . Highest education level: Not on file  Occupational History  . 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 Smoking status: Never Smoker  . Smokeless tobacco: Never Used  Substance and Sexual Activity  . Alcohol use: Yes    Alcohol/week: 1.0 standard drinks    Types: 1 Standard drinks or equivalent per week  Comment: occasionally  . Drug use: No  . Sexual activity: Not on file  Other Topics Concern  . Not on file  Social History Narrative  . Not on file   Social Determinants of Health   Financial Resource Strain:   . Difficulty of Paying Living Expenses:   Food Insecurity:   . Worried About Charity fundraiser in the Last Year:   . Arboriculturist in the Last Year:   Transportation Needs:   . Film/video editor (Medical):   Marland Kitchen Lack of Transportation (Non-Medical):   Physical Activity:   . Days of Exercise per Week:   . Minutes of Exercise per Session:   Stress:   . Feeling of Stress :   Social  Connections:   . Frequency of Communication with Friends and Family:   . Frequency of Social Gatherings with Friends and Family:   . Attends Religious Services:   . Active Member of Clubs or Organizations:   . Attends Archivist Meetings:   Marland Kitchen Marital Status:     Vitals:   03/23/20 1604  BP: 110/76  Pulse: 84  Resp: 12  Temp: 98 F (36.7 C)  SpO2: 98%   Body mass index is 30.91 kg/m.   Physical Exam  Nursing note and vitals reviewed. Constitutional: She is oriented to person, place, and time. She appears well-developed. She does not appear ill. No distress.  HENT:  Head: Normocephalic and atraumatic.  Eyes: Pupils are equal, round, and reactive to light. Conjunctivae and EOM are normal.  Cardiovascular: Normal rate and regular rhythm.  Respiratory: Effort normal. No respiratory distress.  GI: Soft. She exhibits no mass. There is no hepatomegaly. There is no abdominal tenderness.  Musculoskeletal:        General: No edema.       Back:     Right hip: No tenderness or bony tenderness. Normal range of motion. Normal strength.     Comments: No significant deformity appreciated. Thre is tenderness upon palpation of right-sided paraspinal muscles. Pain elicited with movement on exam table during examination. No local edema or erythema appreciated, no suspicious lesions.  Neurological: She is alert and oriented to person, place, and time. She has normal strength. Gait normal.  Reflex Scores:      Patellar reflexes are 2+ on the right side and 2+ on the left side. SLR right side elicits lower back pain.  Skin: Skin is warm. No rash noted. No erythema.  Psychiatric: She has a normal mood and affect.  Well groomed, good eye contact.   ASSESSMENT AND PLAN:  Danielle [name] was seen today for back pain and hip pain.  Diagnoses and all orders for this visit:  Acute right-sided low back pain, unspecified whether sciatica present -     predniSONE (DELTASONE) 20 MG tablet; 3  tabs for 3 days, 2 tabs for 3 days, 1 tabs for 3 days, and 1/2 tab for 3 days. Take tables together with breakfast. -     tiZANidine (ZANAFLEX) 4 MG tablet; Take 0.5-1 tablets (2-4 mg total) by mouth every 8 (eight) hours as needed for up to 15 days for muscle spasms.   New problem. We discussed possible etiologies as well as treatment options. Sciatic vs SI . I do not think imaging is needed at this time.  After discussion of side effects she agrees with Prednisone taper and Zanaflex. Instructed to take prednisone with food, breakfast. Do not take Prednisone with Ibuprofen. If pain is  not resolved in 4-6 weeks,lumnbar MRI will be considered.  She voices understanding and agrees with plan.  Return if symptoms worsen or fail to improve.    Shima Compere G. [place], MD  [facility]. [place] office.     A few things to remember from today's visit:   Acute right-sided low back pain, unspecified whether sciatica present - Plan: predniSONE (DELTASONE) 20 MG tablet Take prednisone with ibuprofen at the same time. Take prednisone in the morning with breakfast. If you are still having pain in 4 to 6 weeks or if pain gets worse, we will need to consider a lumbar MRI.   Sciatica  Sciatica is pain, weakness, tingling, or loss of feeling (numbness) along the sciatic nerve. The sciatic nerve starts in the lower back and goes down the back of each leg. Sciatica usually goes away on its own or with treatment. Sometimes, sciatica may come back (recur). What are the causes? This condition happens when the sciatic nerve is pinched or has pressure put on it. This may be the result of:  A disk in between the bones of the spine bulging out too far (herniated disk).  Changes in the spinal disks that occur with aging.  A condition that affects a muscle in the butt.  Extra bone growth near the sciatic nerve.  A break (fracture) of the area between your hip bones  (pelvis).  Pregnancy.  Tumor. This is rare. What increases the risk? You are more likely to develop this condition if you:  Play sports that put pressure or stress on the spine.  Have poor strength and ease of movement (flexibility).  Have had a back injury in the past.  Have had back surgery.  Sit for long periods of time.  Do activities that involve bending or lifting over and over again.  Are very overweight (obese). What are the signs or symptoms? Symptoms can vary from mild to very bad. They may include:  Any of these problems in the lower back, leg, hip, or butt: ? Mild tingling, loss of feeling, or dull aches. ? Burning sensations. ? Sharp pains.  Loss of feeling in the back of the calf or the sole of the foot.  Leg weakness.  Very bad back pain that makes it hard to move. These symptoms may get worse when you cough, sneeze, or laugh. They may also get worse when you sit or stand for long periods of time. How is this treated? This condition often gets better without any treatment. However, treatment may include:  Changing or cutting back on physical activity when you have pain.  Doing exercises and stretching.  Putting ice or heat on the affected area.  Medicines that help: ? To relieve pain and swelling. ? To relax your muscles.  Shots (injections) of medicines that help to relieve pain, irritation, and swelling.  Surgery. Follow these instructions at home: Medicines  Take over-the-counter and prescription medicines only as told by your doctor.  Ask your doctor if the medicine prescribed to you: ? Requires you to avoid driving or using heavy machinery. ? Can cause trouble pooping (constipation). You may need to take these steps to prevent or treat trouble pooping:  Drink enough fluids to keep your pee (urine) pale yellow.  Take over-the-counter or prescription medicines.  Eat foods that are high in fiber. These include beans, whole grains, and  fresh fruits and vegetables.  Limit foods that are high in fat and sugar. These include fried or sweet  foods. Managing pain      If told, put ice on the affected area. ? Put ice in a plastic bag. ? Place a towel between your skin and the bag. ? Leave the ice on for 20 minutes, 2-3 times a day.  If told, put heat on the affected area. Use the heat source that your doctor tells you to use, such as a moist heat pack or a heating pad. ? Place a towel between your skin and the heat source. ? Leave the heat on for 20-30 minutes. ? Remove the heat if your skin turns bright red. This is very important if you are unable to feel pain, heat, or cold. You may have a greater risk of getting burned. Activity   Return to your normal activities as told by your doctor. Ask your doctor what activities are safe for you.  Avoid activities that make your symptoms worse.  Take short rests during the day. ? When you rest for a long time, do some physical activity or stretching between periods of rest. ? Avoid sitting for a long time without moving. Get up and move around at least one time each hour.  Exercise and stretch regularly, as told by your doctor.  Do not lift anything that is heavier than 10 lb (4.5 kg) while you have symptoms of sciatica. ? Avoid lifting heavy things even when you do not have symptoms. ? Avoid lifting heavy things over and over.  When you lift objects, always lift in a way that is safe for your body. To do this, you should: ? Bend your knees. ? Keep the object close to your body. ? Avoid twisting. General instructions  Stay at a healthy weight.  Wear comfortable shoes that support your feet. Avoid wearing high heels.  Avoid sleeping on a mattress that is too soft or too hard. You might have less pain if you sleep on a mattress that is firm enough to support your back.  Keep all follow-up visits as told by your doctor. This is important. Contact a doctor if:  You  have pain that: ? Wakes you up when you are sleeping. ? Gets worse when you lie down. ? Is worse than the pain you have had in the past. ? Lasts longer than 4 weeks.  You lose weight without trying. Get help right away if:  You cannot control when you pee (urinate) or poop (have a bowel movement).  You have weakness in any of these areas and it gets worse: ? Lower back. ? The area between your hip bones. ? Butt. ? Legs.  You have redness or swelling of your back.  You have a burning feeling when you pee. Summary  Sciatica is pain, weakness, tingling, or loss of feeling (numbness) along the sciatic nerve.  This condition happens when the sciatic nerve is pinched or has pressure put on it.  Sciatica can cause pain, tingling, or loss of feeling (numbness) in the lower back, legs, hips, and butt.  Treatment often includes rest, exercise, medicines, and putting ice or heat on the affected area. This information is not intended to replace advice given to you by your health care provider. Make sure you discuss any questions you have with your health care provider. Document Revised: 12/17/2018 Document Reviewed: 12/17/2018 Elsevier Patient Education  [place].  Please be sure medication list is accurate. If a new problem present, please set up appointment sooner than planned today.

## 2020-03-23 NOTE — Patient Instructions (Signed)
A few things to remember from today's visit:   Acute right-sided low back pain, unspecified whether sciatica present - Plan: predniSONE (DELTASONE) 20 MG tablet Take prednisone with ibuprofen at the same time. Take prednisone in the morning with breakfast. If you are still having pain in 4 to 6 weeks or if pain gets worse, we will need to consider a lumbar MRI.   Sciatica  Sciatica is pain, weakness, tingling, or loss of feeling (numbness) along the sciatic nerve. The sciatic nerve starts in the lower back and goes down the back of each leg. Sciatica usually goes away on its own or with treatment. Sometimes, sciatica may come back (recur). What are the causes? This condition happens when the sciatic nerve is pinched or has pressure put on it. This may be the result of:  A disk in between the bones of the spine bulging out too far (herniated disk).  Changes in the spinal disks that occur with aging.  A condition that affects a muscle in the butt.  Extra bone growth near the sciatic nerve.  A break (fracture) of the area between your hip bones (pelvis).  Pregnancy.  Tumor. This is rare. What increases the risk? You are more likely to develop this condition if you:  Play sports that put pressure or stress on the spine.  Have poor strength and ease of movement (flexibility).  Have had a back injury in the past.  Have had back surgery.  Sit for long periods of time.  Do activities that involve bending or lifting over and over again.  Are very overweight (obese). What are the signs or symptoms? Symptoms can vary from mild to very bad. They may include:  Any of these problems in the lower back, leg, hip, or butt: ? Mild tingling, loss of feeling, or dull aches. ? Burning sensations. ? Sharp pains.  Loss of feeling in the back of the calf or the sole of the foot.  Leg weakness.  Very bad back pain that makes it hard to move. These symptoms may get worse when you  cough, sneeze, or laugh. They may also get worse when you sit or stand for long periods of time. How is this treated? This condition often gets better without any treatment. However, treatment may include:  Changing or cutting back on physical activity when you have pain.  Doing exercises and stretching.  Putting ice or heat on the affected area.  Medicines that help: ? To relieve pain and swelling. ? To relax your muscles.  Shots (injections) of medicines that help to relieve pain, irritation, and swelling.  Surgery. Follow these instructions at home: Medicines  Take over-the-counter and prescription medicines only as told by your doctor.  Ask your doctor if the medicine prescribed to you: ? Requires you to avoid driving or using heavy machinery. ? Can cause trouble pooping (constipation). You may need to take these steps to prevent or treat trouble pooping:  Drink enough fluids to keep your pee (urine) pale yellow.  Take over-the-counter or prescription medicines.  Eat foods that are high in fiber. These include beans, whole grains, and fresh fruits and vegetables.  Limit foods that are high in fat and sugar. These include fried or sweet foods. Managing pain      If told, put ice on the affected area. ? Put ice in a plastic bag. ? Place a towel between your skin and the bag. ? Leave the ice on for 20 minutes, 2-3 times a  day.  If told, put heat on the affected area. Use the heat source that your doctor tells you to use, such as a moist heat pack or a heating pad. ? Place a towel between your skin and the heat source. ? Leave the heat on for 20-30 minutes. ? Remove the heat if your skin turns bright red. This is very important if you are unable to feel pain, heat, or cold. You may have a greater risk of getting burned. Activity   Return to your normal activities as told by your doctor. Ask your doctor what activities are safe for you.  Avoid activities that make  your symptoms worse.  Take short rests during the day. ? When you rest for a long time, do some physical activity or stretching between periods of rest. ? Avoid sitting for a long time without moving. Get up and move around at least one time each hour.  Exercise and stretch regularly, as told by your doctor.  Do not lift anything that is heavier than 10 lb (4.5 kg) while you have symptoms of sciatica. ? Avoid lifting heavy things even when you do not have symptoms. ? Avoid lifting heavy things over and over.  When you lift objects, always lift in a way that is safe for your body. To do this, you should: ? Bend your knees. ? Keep the object close to your body. ? Avoid twisting. General instructions  Stay at a healthy weight.  Wear comfortable shoes that support your feet. Avoid wearing high heels.  Avoid sleeping on a mattress that is too soft or too hard. You might have less pain if you sleep on a mattress that is firm enough to support your back.  Keep all follow-up visits as told by your doctor. This is important. Contact a doctor if:  You have pain that: ? Wakes you up when you are sleeping. ? Gets worse when you lie down. ? Is worse than the pain you have had in the past. ? Lasts longer than 4 weeks.  You lose weight without trying. Get help right away if:  You cannot control when you pee (urinate) or poop (have a bowel movement).  You have weakness in any of these areas and it gets worse: ? Lower back. ? The area between your hip bones. ? Butt. ? Legs.  You have redness or swelling of your back.  You have a burning feeling when you pee. Summary  Sciatica is pain, weakness, tingling, or loss of feeling (numbness) along the sciatic nerve.  This condition happens when the sciatic nerve is pinched or has pressure put on it.  Sciatica can cause pain, tingling, or loss of feeling (numbness) in the lower back, legs, hips, and butt.  Treatment often includes rest,  exercise, medicines, and putting ice or heat on the affected area. This information is not intended to replace advice given to you by your health care provider. Make sure you discuss any questions you have with your health care provider. Document Revised: 12/17/2018 Document Reviewed: 12/17/2018 Elsevier Patient Education  [place].  Please be sure medication list is accurate. If a new problem present, please set up appointment sooner than planned today.

## 2020-03-24 ENCOUNTER — Telehealth: Payer: Self-pay | Admitting: Family Medicine

## 2020-03-24 NOTE — Telephone Encounter (Signed)
Pt is calling in stating that the medication (tizanidine 4 MG) that was given to her is not working and made her feel weird and feel a sleep on the couch and had very bad dreams.  Pt state that her pain is 7 1/2 on a scale of 0-10 and it is very hard for her to do anything at work or home like she needs to. Pt state that she is already on a ibuprofen for headaches and really do not want to take too much ibuprofen, but if you feel that she needs to take something else for the back pain so that she will be to take care of things.  [facility] [address]

## 2020-03-27 ENCOUNTER — Encounter: Payer: Self-pay | Admitting: *Deleted

## 2020-03-27 NOTE — Telephone Encounter (Signed)
Message Routed to PCP for review. 

## 2020-03-27 NOTE — Telephone Encounter (Signed)
Next options for pain management is short term opioid treatment, Hydrocodone. Also because pain is getting worse ortho referral could be placed. Thanks, BJ

## 2020-03-27 NOTE — Telephone Encounter (Signed)
Left message to return call to office. Sent patient a Pharmacist, community message informing of options.

## 2020-03-30 NOTE — Telephone Encounter (Signed)
Spoke with patient. Patient reports she is feeling better and has 6 more days on prednisone left. Patient reports she cant take the muscle relaxer b/c it makes her feel "weird".

## 2020-04-16 ENCOUNTER — Other Ambulatory Visit: Payer: Self-pay | Admitting: Psychiatry

## 2020-04-16 DIAGNOSIS — F5105 Insomnia due to other mental disorder: Secondary | ICD-10-CM

## 2020-04-16 DIAGNOSIS — F410 Panic disorder [episodic paroxysmal anxiety] without agoraphobia: Secondary | ICD-10-CM

## 2020-04-16 DIAGNOSIS — F411 Generalized anxiety disorder: Secondary | ICD-10-CM

## 2020-04-22 ENCOUNTER — Telehealth: Payer: Self-pay | Admitting: Psychiatry

## 2020-04-22 DIAGNOSIS — F39 Unspecified mood [affective] disorder: Secondary | ICD-10-CM

## 2020-04-22 DIAGNOSIS — F411 Generalized anxiety disorder: Secondary | ICD-10-CM

## 2020-04-22 DIAGNOSIS — F410 Panic disorder [episodic paroxysmal anxiety] without agoraphobia: Secondary | ICD-10-CM

## 2020-04-22 NOTE — Telephone Encounter (Signed)
Pt is having an increase in her anxiety lately. Not sure why but has been getting worse . Advice?

## 2020-04-23 MED ORDER — VORTIOXETINE HBR 5 MG PO TABS: ORAL_TABLET | ORAL | 0 refills | Status: DC

## 2020-04-23 MED ORDER — CLONIDINE HCL 0.1 MG PO TABS: ORAL_TABLET | ORAL | 0 refills | Status: DC

## 2020-04-23 NOTE — Addendum Note (Signed)
Addended by: Sharyl Nimrod on: 04/23/2020 10:41 AM   Modules accepted: Orders

## 2020-04-23 NOTE — Telephone Encounter (Signed)
She reports that she has been having "a lot of difficulty" and is unsure what is triggering increased anxiety. "It's turned from anxiety to a panic feeling." She reports that she was not able to sleep last night and heart was racing and felt like something was going to happen." She reports awakening "with a doom and gloom feeling."   She reports that Klonopin does not help as much as it once did. She reports that she had been weaning off Klonopin before anxiety started 1-2 weeks ago.   She reports that brother is going through a difficult separation.   Plan: Start Clonidine 0.1 mg 1/2 tab for 3-5 nights, then increase to 1 tab po QHS as tolerated to improve anxiety and insomnia. Discussed potential benefits, risks, and side effects of Trintellix. Pt agrees to trial of Trintellix. Will start Trintellix 5 mg po qd for depression and anxiety. Will pull samples for pt to pick up. Discussed increasing Klonopin prn use until anxiety improves. Pt reports that she is considering re-starting therapy in the future and will discuss with provider when she is ready for therapy referral. Patient advised to contact office with any questions, adverse effects, or acute worsening in signs and symptoms.

## 2020-05-13 ENCOUNTER — Other Ambulatory Visit: Payer: Self-pay

## 2020-05-13 ENCOUNTER — Other Ambulatory Visit: Payer: Self-pay | Admitting: Psychiatry

## 2020-05-13 DIAGNOSIS — F411 Generalized anxiety disorder: Secondary | ICD-10-CM

## 2020-05-13 DIAGNOSIS — F39 Unspecified mood [affective] disorder: Secondary | ICD-10-CM

## 2020-05-13 DIAGNOSIS — F410 Panic disorder [episodic paroxysmal anxiety] without agoraphobia: Secondary | ICD-10-CM

## 2020-05-13 MED ORDER — VORTIOXETINE HBR 10 MG PO TABS: 10.0000 mg | ORAL_TABLET | Freq: Every day | ORAL | 1 refills | Status: DC

## 2020-05-13 MED ORDER — CLONIDINE HCL 0.1 MG PO TABS: ORAL_TABLET | ORAL | 1 refills | Status: DC

## 2020-05-13 NOTE — Telephone Encounter (Addendum)
Pt has been taking samples of TRINTELLIX and she said she feels better and would like to have an RX sent to her pharmacy. Pt also has 5 tabs left of the Clonidine and is going to need refills.  [facility] [place] [place] [facility].

## 2020-05-13 NOTE — Telephone Encounter (Signed)
She reports that she is noticing some "subtle" improvements since starting Trintellix and has wanted to be around people more and wanting to socialize more. Denies side effects. Had some headaches the first few days she started Trintellix and is unsure if this was related to Trintellix. Some continued physical s/s with anxiety, as if her heart is skipping beats and this has improved some.   Now taking Gabapentin only once a day.   Will increase Trintellix to 10 mg po qd to improve mood and anxiety.

## 2020-06-01 DIAGNOSIS — Z20822 Contact with and (suspected) exposure to covid-19: Secondary | ICD-10-CM | POA: Diagnosis not present

## 2020-06-01 DIAGNOSIS — Z03818 Encounter for observation for suspected exposure to other biological agents ruled out: Secondary | ICD-10-CM | POA: Diagnosis not present

## 2020-06-08 ENCOUNTER — Other Ambulatory Visit: Payer: Self-pay | Admitting: Psychiatry

## 2020-06-08 DIAGNOSIS — F410 Panic disorder [episodic paroxysmal anxiety] without agoraphobia: Secondary | ICD-10-CM

## 2020-07-08 ENCOUNTER — Ambulatory Visit (INDEPENDENT_AMBULATORY_CARE_PROVIDER_SITE_OTHER): Payer: BC Managed Care – PPO | Admitting: Psychiatry

## 2020-07-08 ENCOUNTER — Encounter: Payer: Self-pay | Admitting: Psychiatry

## 2020-07-08 ENCOUNTER — Other Ambulatory Visit: Payer: Self-pay

## 2020-07-08 DIAGNOSIS — F5105 Insomnia due to other mental disorder: Secondary | ICD-10-CM

## 2020-07-08 DIAGNOSIS — F99 Mental disorder, not otherwise specified: Secondary | ICD-10-CM

## 2020-07-08 DIAGNOSIS — F410 Panic disorder [episodic paroxysmal anxiety] without agoraphobia: Secondary | ICD-10-CM

## 2020-07-08 DIAGNOSIS — F411 Generalized anxiety disorder: Secondary | ICD-10-CM

## 2020-07-08 DIAGNOSIS — F39 Unspecified mood [affective] disorder: Secondary | ICD-10-CM | POA: Diagnosis not present

## 2020-07-08 MED ORDER — TRAZODONE HCL 50 MG PO TABS: ORAL_TABLET | ORAL | 1 refills | Status: DC

## 2020-07-08 MED ORDER — CLONAZEPAM 0.5 MG PO TABS: 0.5000 mg | ORAL_TABLET | Freq: Two times a day (BID) | ORAL | 5 refills | Status: DC | PRN

## 2020-07-08 MED ORDER — VORTIOXETINE HBR 20 MG PO TABS: 20.0000 mg | ORAL_TABLET | Freq: Every day | ORAL | 1 refills | Status: DC

## 2020-07-08 NOTE — Progress Notes (Signed)
Danielle [name] [id number] 20-Jul-1981 39 y.o.  Subjective:   Patient ID:  Danielle [name] is a 39 y.o. (DOB [birth year]) female.  Chief Complaint:  Chief Complaint  Patient presents with   Anxiety   Follow-up    h/o Depression and insomnia    HPI Danielle [name] presents to the office today for follow-up of anxiety, depression, and insomnia. She reports that she had worsening anxiety in May with some panic s/s. She had periods of increased HR, feeling like she could not get enough air in, some nausea with worsening s/s at night. She used Clonidine for awhile and is no longer having these s/s and stopped Clonidine. She reports that she continues to have "constant" generalized anxiety. Reports frequent worry and catastrophic thinking. Some rumination.  Reports occasional heart racing at night and try to re-focus this.   Reports depression has been "ok, pretty stable." Noticed her mood was lower after returning from trip to the beach. Has been planning and doing more things. Initiating some social gatherings which is not typical. Reports energy and motivation has been ok overall. Has been able to focus and read again and is enjoying this. Sleep has been ok. Appetite has been "the same." Reports that weight has been stable. Denies SI.   Has plans to start therapy with a new therapist. Reports that she grew up in a very strict home where she was required to go to church multiple times a week. She questions if some of her upbringing contribute to her anxiety and feeling as if she has to meet certain standards/perfectionistic thinking.   Has been decreasing soda intake.   She reports that she is taking Klonopin prn about twice a week.   Past Psychiatric Medication Trials: Abilify- Tremor Latuda Vraylar- Irritability, Agitation Rexulti- Palpitations Lamictal Viibryd- panic Trintellix Prozac Effexor XR Zoloft Lexapro Wellbutrin- Headaches Buspar Xanax- excessive somnolence,  ineffective Clorazepate- ineffective Klonopin Ativan Trileptal-ineffective Trazodone- Has had dizziness and nausea with doses above 100 mg. Deplin Propranolol-Ineffective Clonidine- May have been helpful for panic s/s.  Gabapentin- Noticed restlessness with 900 mg.   PHQ2-9     Office Visit from 01/10/2017 in Primary Care at [place] from 10/07/2016 in Primary Care at [place] from 05/13/2016 in Primary Care at [facility] Total Score 0 1 0       Review of Systems:  Review of Systems  Gastrointestinal: Negative.   Musculoskeletal: Negative for gait problem.  Neurological: Positive for headaches.  Psychiatric/Behavioral:       Please refer to HPI    Medications: I have reviewed the patient's current medications.  Current Outpatient Medications  Medication Sig Dispense Refill   albuterol (VENTOLIN HFA) 108 (90 Base) MCG/ACT inhaler INHALE 2 PUFFS INTO THE LUNGS EVERY 6 (SIX) HOURS AS NEEDED. FOR WHEEZE OR SHORTNESS OF BREATH 8.5 g 2   BIOTIN PO Take by mouth.     cetirizine (ZYRTEC) 10 MG tablet Take 10 mg by mouth daily as needed.      ELDERBERRY PO Take by mouth.     Multiple Vitamin (MULTIVITAMIN) tablet Take 1 tablet by mouth daily.     omeprazole (PRILOSEC) 20 MG capsule Take 20 mg by mouth daily.     traZODone (DESYREL) 50 MG tablet TAKE 1 TO 3 TABS BY MOUTH AT BEDTIME AS DIRECTED 90 tablet 1   vortioxetine HBr (TRINTELLIX) 10 MG TABS tablet Take 1 tablet (10 mg total) by mouth daily. 30 tablet 1  clonazePAM (KLONOPIN) 0.5 MG tablet Take 1 tablet (0.5 mg total) by mouth 2 (two) times daily as needed for anxiety. 60 tablet 5   vortioxetine HBr (TRINTELLIX) 20 MG TABS tablet Take 1 tablet (20 mg total) by mouth daily. 30 tablet 1   No current facility-administered medications for this visit.    Medication Side Effects: None  Allergies:  Allergies  Allergen Reactions   Doxycycline Nausea And Vomiting    Other reaction(s): Vomiting     Past Medical History:  Diagnosis Date   Asthma    Constipation    Generalized headaches    Other and unspecified ovarian cysts    Pulse irregularity    Rapid heart rate    Seasonal allergies     Family History  Problem Relation Age of Onset   Hypertension Mother    Hypertension Father    Anxiety disorder Father    Anxiety disorder Brother    Depression Brother    ADD / ADHD Son    Tourette syndrome Son    CAD Neg Hx     Social History   Socioeconomic History   Marital status: Married    Spouse name: Not on file   Number of children: 2   Years of education: Not on file   Highest education level: Not on file  Occupational History   Occupation: Therapist, 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  Tobacco Use   Smoking status: Never Smoker   Smokeless tobacco: Never Used  Substance and Sexual Activity   Alcohol use: Yes    Alcohol/week: 1.0 standard drink    Types: 1 Standard drinks or equivalent per week    Comment: occasionally   Drug use: No   Sexual activity: Not on file  Other Topics Concern   Not on file  Social History Narrative   Not on file   Social Determinants of Health   Financial Resource Strain:    Difficulty of Paying Living Expenses:   Food Insecurity:    Worried About Charity fundraiser in the Last Year:    Arboriculturist in the Last Year:   Transportation Needs:    Film/video editor (Medical):    Lack of Transportation (Non-Medical):   Physical Activity:    Days of Exercise per Week:    Minutes of Exercise per Session:   Stress:    Feeling of Stress :   Social Connections:    Frequency of Communication with Friends and Family:    Frequency of Social Gatherings with Friends and Family:    Attends Religious Services:    Active Member of Clubs or Organizations:    Attends Music therapist:    Marital Status:   Intimate Partner Violence:    Fear of Current or Ex-Partner:    Emotionally Abused:     Physically Abused:    Sexually Abused:     Past Medical History, Surgical history, Social history, and Family history were reviewed and updated as appropriate.   Please see review of systems for further details on the patient's review from today.   Objective:   Physical Exam:  There were no vitals taken for this visit.  Physical Exam Constitutional:      General: She is not in acute distress. Musculoskeletal:        General: No deformity.  Neurological:     Mental Status: She is alert and oriented to person, place, and time.     Coordination: Coordination normal.  Psychiatric:  Attention and Perception: Attention and perception normal. She does not perceive auditory or visual hallucinations.        Mood and Affect: Mood is anxious. Mood is not depressed. Affect is not labile, blunt, angry or inappropriate.        Speech: Speech normal.        Behavior: Behavior normal.        Thought Content: Thought content normal. Thought content is not paranoid or delusional. Thought content does not include homicidal or suicidal ideation. Thought content does not include homicidal or suicidal plan.        Cognition and Memory: Cognition and memory normal.        Judgment: Judgment normal.     Comments: Insight intact     Lab Review:     Component Value Date/Time   NA 138 10/29/2019 0945   K 3.9 10/29/2019 0945   CL 105 10/29/2019 0945   CO2 24 10/29/2019 0945   GLUCOSE 98 10/29/2019 0945   BUN 10 10/29/2019 0945   CREATININE 0.62 10/29/2019 0945   CALCIUM 9.4 10/29/2019 0945   PROT 7.1 11/25/2016 0953   ALBUMIN 4.5 11/25/2016 0953   AST 15 11/25/2016 0953   ALT 15 11/25/2016 0953   ALKPHOS 71 11/25/2016 0953   BILITOT 0.8 11/25/2016 0953   GFRNONAA >90 10/02/2012 0006   GFRAA >90 10/02/2012 0006       Component Value Date/Time   WBC 12.5 (H) 10/02/2012 0006   RBC 4.51 10/02/2012 0006   HGB 14.2 10/02/2012 0006   HCT 38.5 10/02/2012 0006   PLT 214 10/02/2012 0006    MCV 85.4 10/02/2012 0006   MCH 31.5 10/02/2012 0006   MCHC 36.9 (H) 10/02/2012 0006   RDW 12.0 10/02/2012 0006    No results found for: POCLITH, LITHIUM   No results found for: PHENYTOIN, PHENOBARB, VALPROATE, CBMZ   .res Assessment: Plan:   Discussed that anxiety s/s may be better controlled with higher doses of Trintellix and discussed potential benefits, risks, and side effects of increasing Trintellix.  Patient agrees to trial of increased dose with plan to resume current dose if needed.  Discussed that Trintellix has a long half-life and patient may be started with taking 20 mg every other day alternating with Trintellix 10 mg every other day for 1 month, then increasing to Trintellix 20 mg daily as tolerated.  Patient provided with Trintellix 10 mg #14 tablets samples for titration process. Continue Klonopin as needed for anxiety. Continue trazodone as needed for insomnia. Will discontinue gabapentin 300 mg daily since patient reports that this does not seem to be helpful for her anxiety. Will remove clonidine from current medication list since patient reports that she has not needed clonidine recently.  She reports that clonidine may have been helpful during period of increased panic signs and symptoms and may consider restarting in the future if similar signs and symptoms return. Agree with plan to resume psychotherapy since this may be helpful with addressing underlying causes of anxiety and improve coping strategies. Patient to follow-up in 3 months or sooner if clinically indicated. Patient advised to contact office with any questions, adverse effects, or acute worsening in signs and symptoms.  Danielle [name] was seen today for anxiety and follow-up.  Diagnoses and all orders for this visit:  Panic disorder -     vortioxetine HBr (TRINTELLIX) 20 MG TABS tablet; Take 1 tablet (20 mg total) by mouth daily. -     clonazePAM (KLONOPIN) 0.5 MG  tablet; Take 1 tablet (0.5 mg total) by mouth 2  (two) times daily as needed for anxiety.  Mood disorder (HCC) -     vortioxetine HBr (TRINTELLIX) 20 MG TABS tablet; Take 1 tablet (20 mg total) by mouth daily.  Generalized anxiety disorder -     vortioxetine HBr (TRINTELLIX) 20 MG TABS tablet; Take 1 tablet (20 mg total) by mouth daily. -     clonazePAM (KLONOPIN) 0.5 MG tablet; Take 1 tablet (0.5 mg total) by mouth 2 (two) times daily as needed for anxiety.  Insomnia due to other mental disorder -     traZODone (DESYREL) 50 MG tablet; TAKE 1 TO 3 TABS BY MOUTH AT BEDTIME AS DIRECTED -     clonazePAM (KLONOPIN) 0.5 MG tablet; Take 1 tablet (0.5 mg total) by mouth 2 (two) times daily as needed for anxiety.     Please see After Visit Summary for patient specific instructions.  Future Appointments  Date Time Provider Story  10/09/2020  8:00 AM Thayer Headings, PMHNP [facility] None    No orders of the defined types were placed in this encounter.   -------------------------------

## 2020-07-10 DIAGNOSIS — F411 Generalized anxiety disorder: Secondary | ICD-10-CM | POA: Diagnosis not present

## 2020-07-10 DIAGNOSIS — F330 Major depressive disorder, recurrent, mild: Secondary | ICD-10-CM | POA: Diagnosis not present

## 2020-07-22 DIAGNOSIS — F330 Major depressive disorder, recurrent, mild: Secondary | ICD-10-CM | POA: Diagnosis not present

## 2020-07-22 DIAGNOSIS — F411 Generalized anxiety disorder: Secondary | ICD-10-CM | POA: Diagnosis not present

## 2020-08-05 DIAGNOSIS — F411 Generalized anxiety disorder: Secondary | ICD-10-CM | POA: Diagnosis not present

## 2020-08-05 DIAGNOSIS — F330 Major depressive disorder, recurrent, mild: Secondary | ICD-10-CM | POA: Diagnosis not present

## 2020-08-12 DIAGNOSIS — Z1231 Encounter for screening mammogram for malignant neoplasm of breast: Secondary | ICD-10-CM | POA: Diagnosis not present

## 2020-08-18 ENCOUNTER — Telehealth: Payer: Self-pay | Admitting: Psychiatry

## 2020-08-18 DIAGNOSIS — F411 Generalized anxiety disorder: Secondary | ICD-10-CM

## 2020-08-18 DIAGNOSIS — F410 Panic disorder [episodic paroxysmal anxiety] without agoraphobia: Secondary | ICD-10-CM

## 2020-08-18 DIAGNOSIS — F39 Unspecified mood [affective] disorder: Secondary | ICD-10-CM

## 2020-08-18 MED ORDER — VORTIOXETINE HBR 10 MG PO TABS: 10.0000 mg | ORAL_TABLET | Freq: Every day | ORAL | 1 refills | Status: DC

## 2020-08-18 NOTE — Telephone Encounter (Signed)
Please review

## 2020-08-18 NOTE — Addendum Note (Signed)
Addended by: Sharyl Nimrod on: 08/18/2020 03:31 PM   Modules accepted: Orders

## 2020-08-18 NOTE — Telephone Encounter (Signed)
Pt LM on VM  Would like to decrease dose back to 10 mg of Trintellix. Having too many side effects with increased dose. Head aches, Lower mood and anxiety worse. [facility]ing ref[facility] [employer]. APT 10/29

## 2020-08-19 DIAGNOSIS — F411 Generalized anxiety disorder: Secondary | ICD-10-CM | POA: Diagnosis not present

## 2020-08-19 DIAGNOSIS — F330 Major depressive disorder, recurrent, mild: Secondary | ICD-10-CM | POA: Diagnosis not present

## 2020-08-19 NOTE — Telephone Encounter (Signed)
Patient given instructions and verbalized understanding. Will call back if symptoms aren't improving.

## 2020-09-03 DIAGNOSIS — F411 Generalized anxiety disorder: Secondary | ICD-10-CM | POA: Diagnosis not present

## 2020-09-03 DIAGNOSIS — F330 Major depressive disorder, recurrent, mild: Secondary | ICD-10-CM | POA: Diagnosis not present

## 2020-09-23 ENCOUNTER — Encounter: Payer: Self-pay | Admitting: Family Medicine

## 2020-10-09 ENCOUNTER — Other Ambulatory Visit: Payer: Self-pay

## 2020-10-09 ENCOUNTER — Ambulatory Visit (INDEPENDENT_AMBULATORY_CARE_PROVIDER_SITE_OTHER): Payer: BC Managed Care – PPO | Admitting: Psychiatry

## 2020-10-09 ENCOUNTER — Ambulatory Visit: Payer: BC Managed Care – PPO | Admitting: Psychiatry

## 2020-10-09 ENCOUNTER — Encounter: Payer: Self-pay | Admitting: Psychiatry

## 2020-10-09 DIAGNOSIS — F411 Generalized anxiety disorder: Secondary | ICD-10-CM | POA: Diagnosis not present

## 2020-10-09 DIAGNOSIS — F5105 Insomnia due to other mental disorder: Secondary | ICD-10-CM | POA: Diagnosis not present

## 2020-10-09 DIAGNOSIS — F410 Panic disorder [episodic paroxysmal anxiety] without agoraphobia: Secondary | ICD-10-CM | POA: Diagnosis not present

## 2020-10-09 DIAGNOSIS — F39 Unspecified mood [affective] disorder: Secondary | ICD-10-CM | POA: Diagnosis not present

## 2020-10-09 DIAGNOSIS — F99 Mental disorder, not otherwise specified: Secondary | ICD-10-CM

## 2020-10-09 MED ORDER — TRAZODONE HCL 50 MG PO TABS: ORAL_TABLET | ORAL | 1 refills | Status: DC

## 2020-10-09 MED ORDER — VORTIOXETINE HBR 5 MG PO TABS: ORAL_TABLET | ORAL | 0 refills | Status: DC

## 2020-10-09 NOTE — Progress Notes (Signed)
Danielle [name] [id number] 09-May-1981 39 y.o.  Subjective:   Patient ID:  Danielle [name] is a 39 y.o. (DOB 06-30-1981) female.  Chief Complaint:  Chief Complaint  Patient presents with  . Depression  . Anxiety  . Follow-up    Insomnia    HPI Danielle [name] presents to the office today for follow-up of depression and anxiety. She reports that her anxiety has been "ever present still." She reports, "I haven't felt well mentally or physically for awhile." She reports that she has frequent muscle tension and tightness. She reports constant worry and rumination. Reports feeling that she is "in a constant state of uneasiness." She reports that she noticed increased headaches with daily headaches around the time she started higher dose of Trintellix. She had another period of daily headaches and is now taking Trintellix 10 mg q other day.  She reports that she would like to come off medications.   She reports that she has had 2 depressive episodes and one in Sep 10, 2023 that lasted 2 weeks. She reports that she had very low energy and motivation at that time. A good childhood friend died in early Sep 10, 2023 with Multiple Myeloma. She reports that this was the first person around her age that she has lost.   She reports that at night she has involuntary twitches of her fingers and feet and feels as if her skin is crawling. She reports that these episodes happen several times a week. She reports that it tends to happen when she is trying to sleep.   She reports that her sleep "is all over the place" with some nights going to bed at 7:30 pm and other nights where she cannot fall asleep. Appetite has been the same. She reports that there are times she cannot focus to read and was able to read a book this week for the first time in awhile. She reports that she has been able to enjoy some things, such as recent family trip to the mountains. She reports, "I don't think I am in a bad depression  because I am doing what I need to do and functioning." Energy and motivation are lower than she would like but sufficient to complete tasks and responsibilities. She reports negative self-talk and excessive guilt. Denies SI.   Denies impulsive or risky behaviors. Denies elevated moods.   She reports that she has been having to take Klonopin about 3 times a week and occasionally 2 in one day.   Has been seeing Melissa for therapy at the [facility]. Has been having cognitive behavioral therapy.   Past Psychiatric Medication Trials: Abilify- Tremor Latuda Vraylar- Irritability, Agitation Rexulti- Palpitations Lamictal Viibryd- panic Trintellix- HA's, possible headaches. Prozac Effexor XR Zoloft Lexapro Wellbutrin- Headaches Buspar Xanax- excessive somnolence, ineffective Clorazepate- ineffective Klonopin Ativan Trileptal-ineffective Trazodone- Has had dizziness and nausea with doses above 100 mg. Deplin Propranolol-Ineffective Clonidine- May have been helpful for panic s/s.  Gabapentin- Noticed restlessness with 900 mg.  PHQ2-9     Office Visit from 01/10/2017 in Primary Care at [facility] from 10/07/2016 in Primary Care at [facility] from 05/13/2016 in Primary Care at [facility] Total Score 0 1 0       Review of Systems:  Review of Systems  Musculoskeletal: Negative for gait problem.       Muscle tension  Neurological: Negative for tremors.       Paresthesias  Psychiatric/Behavioral:       Please refer to HPI  Medications: I have reviewed the patient's current medications.  Current Outpatient Medications  Medication Sig Dispense Refill  . albuterol (VENTOLIN HFA) 108 (90 Base) MCG/ACT inhaler INHALE 2 PUFFS INTO THE LUNGS EVERY 6 (SIX) HOURS AS NEEDED. FOR WHEEZE OR SHORTNESS OF BREATH 8.5 g 2  . BIOTIN PO Take by mouth.    . cetirizine (ZYRTEC) 10 MG tablet Take 10 mg by mouth daily as needed.     Marland Kitchen omeprazole (PRILOSEC) 20 MG capsule  Take 20 mg by mouth daily.    . traZODone (DESYREL) 50 MG tablet TAKE 1 TO 3 TABS BY MOUTH AT BEDTIME AS DIRECTED 90 tablet 1  . clonazePAM (KLONOPIN) 0.5 MG tablet Take 1 tablet (0.5 mg total) by mouth 2 (two) times daily as needed for anxiety. 60 tablet 5  . Multiple Vitamin (MULTIVITAMIN) tablet Take 1 tablet by mouth daily. (Patient not taking: Reported on 10/09/2020)    . vortioxetine HBr (TRINTELLIX) 5 MG TABS tablet Take 1 tablet every other day for 2 weeks, then stop 30 tablet 0   No current facility-administered medications for this visit.    Medication Side Effects: Other: Possible headaches  Allergies:  Allergies  Allergen Reactions  . Doxycycline Nausea And Vomiting    Other reaction(s): Vomiting    Past Medical History:  Diagnosis Date  . Asthma   . Constipation   . Generalized headaches   . Other and unspecified ovarian cysts   . Pulse irregularity   . Rapid heart rate   . Seasonal allergies     Family History  Problem Relation Age of Onset  . Hypertension Mother   . Hypertension Father   . Anxiety disorder Father   . Anxiety disorder Brother   . Depression Brother   . ADD / ADHD Son   . Tourette syndrome Son   . CAD Neg Hx     Social History   Socioeconomic History  . Marital status: Married    Spouse name: Not on file  . Number of children: 2  . Years of education: Not on file  . Highest education level: Not on file  Occupational History  . 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 Smoking status: Never Smoker  . Smokeless tobacco: Never Used  Substance and Sexual Activity  . Alcohol use: Yes    Alcohol/week: 1.0 standard drink    Types: 1 Standard drinks or equivalent per week    Comment: occasionally  . Drug use: No  . Sexual activity: Not on file  Other Topics Concern  . Not on file  Social History Narrative  . Not on file   Social Determinants of Health   Financial Resource Strain:   . Difficulty of Paying Living Expenses:  Not on file  Food Insecurity:   . Worried About Charity fundraiser in the Last Year: Not on file  . Ran Out of Food in the Last Year: Not on file  Transportation Needs:   . Lack of Transportation (Medical): Not on file  . Lack of Transportation (Non-Medical): Not on file  Physical Activity:   . Days of Exercise per Week: Not on file  . Minutes of Exercise per Session: Not on file  Stress:   . Feeling of Stress : Not on file  Social Connections:   . Frequency of Communication with Friends and Family: Not on file  . Frequency of Social Gatherings with Friends and Family: Not on file  . Attends Religious Services:  Not on file  . Active Member of Clubs or Organizations: Not on file  . Attends Archivist Meetings: Not on file  . Marital Status: Not on file  Intimate Partner Violence:   . Fear of Current or Ex-Partner: Not on file  . Emotionally Abused: Not on file  . Physically Abused: Not on file  . Sexually Abused: Not on file    Past Medical History, Surgical history, Social history, and Family history were reviewed and updated as appropriate.   Please see review of systems for further details on the patient's review from today.   Objective:   Physical Exam:  There were no vitals taken for this visit.  Physical Exam Constitutional:      General: She is not in acute distress. Musculoskeletal:        General: No deformity.  Neurological:     Mental Status: She is alert and oriented to person, place, and time.     Coordination: Coordination normal.  Psychiatric:        Attention and Perception: Attention and perception normal. She does not perceive auditory or visual hallucinations.        Mood and Affect: Mood is anxious and depressed. Affect is not labile, blunt, angry or inappropriate.        Speech: Speech normal.        Behavior: Behavior normal.        Thought Content: Thought content normal. Thought content is not paranoid or delusional. Thought content  does not include homicidal or suicidal ideation. Thought content does not include homicidal or suicidal plan.        Cognition and Memory: Cognition and memory normal.        Judgment: Judgment normal.     Comments: Insight intact     Lab Review:     Component Value Date/Time   NA 138 10/29/2019 0945   K 3.9 10/29/2019 0945   CL 105 10/29/2019 0945   CO2 24 10/29/2019 0945   GLUCOSE 98 10/29/2019 0945   BUN 10 10/29/2019 0945   CREATININE 0.62 10/29/2019 0945   CALCIUM 9.4 10/29/2019 0945   PROT 7.1 11/25/2016 0953   ALBUMIN 4.5 11/25/2016 0953   AST 15 11/25/2016 0953   ALT 15 11/25/2016 0953   ALKPHOS 71 11/25/2016 0953   BILITOT 0.8 11/25/2016 0953   GFRNONAA >90 10/02/2012 0006   GFRAA >90 10/02/2012 0006       Component Value Date/Time   WBC 12.5 (H) 10/02/2012 0006   RBC 4.51 10/02/2012 0006   HGB 14.2 10/02/2012 0006   HCT 38.5 10/02/2012 0006   PLT 214 10/02/2012 0006   MCV 85.4 10/02/2012 0006   MCH 31.5 10/02/2012 0006   MCHC 36.9 (H) 10/02/2012 0006   RDW 12.0 10/02/2012 0006    No results found for: POCLITH, LITHIUM   No results found for: PHENYTOIN, PHENOBARB, VALPROATE, CBMZ   .res Assessment: Plan:   Patient reports that she would prefer to come off of Trintellix since she has not seen any significant improvement.  Will decrease Trintellix to 5 mg every other day for 2 weeks, then stop.  Continue Klonopin prn. Continue Trazodone at bedtime for insomnia and depression. She reports that she may try to reduce Trazodone and is currently taking 100 mg. Patient to follow-up in 3 months or sooner if clinically indicated. Patient advised to contact office with any questions, adverse effects, or acute worsening in signs and symptoms.  Jennife was seen today  for depression, anxiety and follow-up.  Diagnoses and all orders for this visit:  Insomnia due to other mental disorder -     traZODone (DESYREL) 50 MG tablet; TAKE 1 TO 3 TABS BY MOUTH AT BEDTIME AS  DIRECTED  Panic disorder -     vortioxetine HBr (TRINTELLIX) 5 MG TABS tablet; Take 1 tablet every other day for 2 weeks, then stop  Mood disorder (HCC) -     vortioxetine HBr (TRINTELLIX) 5 MG TABS tablet; Take 1 tablet every other day for 2 weeks, then stop  Generalized anxiety disorder -     vortioxetine HBr (TRINTELLIX) 5 MG TABS tablet; Take 1 tablet every other day for 2 weeks, then stop     Please see After Visit Summary for patient specific instructions.  Future Appointments  Date Time Provider [place]  10/28/2020 10:00 AM Martin[place], Betty G, MD [facility]  01/15/2021  9:30 AM Thayer Headings, PMHNP [facility] None    No orders of the defined types were placed in this encounter.   -------------------------------

## 2020-10-14 ENCOUNTER — Other Ambulatory Visit: Payer: Self-pay | Admitting: Psychiatry

## 2020-10-14 DIAGNOSIS — F39 Unspecified mood [affective] disorder: Secondary | ICD-10-CM

## 2020-10-14 DIAGNOSIS — F411 Generalized anxiety disorder: Secondary | ICD-10-CM

## 2020-10-14 DIAGNOSIS — F410 Panic disorder [episodic paroxysmal anxiety] without agoraphobia: Secondary | ICD-10-CM

## 2020-10-16 DIAGNOSIS — F330 Major depressive disorder, recurrent, mild: Secondary | ICD-10-CM | POA: Diagnosis not present

## 2020-10-16 DIAGNOSIS — F411 Generalized anxiety disorder: Secondary | ICD-10-CM | POA: Diagnosis not present

## 2020-10-28 ENCOUNTER — Other Ambulatory Visit: Payer: Self-pay

## 2020-10-28 ENCOUNTER — Ambulatory Visit (INDEPENDENT_AMBULATORY_CARE_PROVIDER_SITE_OTHER): Payer: BC Managed Care – PPO | Admitting: Family Medicine

## 2020-10-28 VITALS — BP 120/82 | HR 72 | Temp 98.2°F | Resp 12 | Ht 62.0 in | Wt 174.2 lb

## 2020-10-28 DIAGNOSIS — Z130 Encounter for screening for diseases of the blood and blood-forming organs and certain disorders involving the immune mechanism: Secondary | ICD-10-CM

## 2020-10-28 DIAGNOSIS — Z1329 Encounter for screening for other suspected endocrine disorder: Secondary | ICD-10-CM

## 2020-10-28 DIAGNOSIS — Z0000 Encounter for general adult medical examination without abnormal findings: Secondary | ICD-10-CM

## 2020-10-28 DIAGNOSIS — Z1159 Encounter for screening for other viral diseases: Secondary | ICD-10-CM | POA: Diagnosis not present

## 2020-10-28 DIAGNOSIS — R519 Headache, unspecified: Secondary | ICD-10-CM

## 2020-10-28 DIAGNOSIS — Z13228 Encounter for screening for other metabolic disorders: Secondary | ICD-10-CM | POA: Diagnosis not present

## 2020-10-28 DIAGNOSIS — J4521 Mild intermittent asthma with (acute) exacerbation: Secondary | ICD-10-CM

## 2020-10-28 DIAGNOSIS — R1310 Dysphagia, unspecified: Secondary | ICD-10-CM

## 2020-10-28 DIAGNOSIS — J3489 Other specified disorders of nose and nasal sinuses: Secondary | ICD-10-CM

## 2020-10-28 DIAGNOSIS — Z23 Encounter for immunization: Secondary | ICD-10-CM

## 2020-10-28 DIAGNOSIS — Z13220 Encounter for screening for lipoid disorders: Secondary | ICD-10-CM | POA: Diagnosis not present

## 2020-10-28 MED ORDER — AMOXICILLIN-POT CLAVULANATE 875-125 MG PO TABS: 1.0000 | ORAL_TABLET | Freq: Two times a day (BID) | ORAL | 0 refills | Status: AC

## 2020-10-28 MED ORDER — ALBUTEROL SULFATE HFA 108 (90 BASE) MCG/ACT IN AERS: 2.0000 | INHALATION_SPRAY | Freq: Four times a day (QID) | RESPIRATORY_TRACT | 2 refills | Status: DC | PRN

## 2020-10-28 MED ORDER — AMITRIPTYLINE HCL 10 MG PO TABS: 10.0000 mg | ORAL_TABLET | Freq: Every day | ORAL | 1 refills | Status: DC

## 2020-10-28 MED ORDER — BUDESONIDE-FORMOTEROL FUMARATE 80-4.5 MCG/ACT IN AERO: 2.0000 | INHALATION_SPRAY | Freq: Two times a day (BID) | RESPIRATORY_TRACT | 3 refills | Status: DC

## 2020-10-28 MED ORDER — PANTOPRAZOLE SODIUM 40 MG PO TBEC: 40.0000 mg | DELAYED_RELEASE_TABLET | Freq: Every day | ORAL | 3 refills | Status: DC

## 2020-10-28 NOTE — Patient Instructions (Addendum)
Today you have you routine preventive visit. A few things to remember from today's visit:   Need for tuberculosis vaccination - Plan: PPD  Dysphagia, unspecified type - Plan: pantoprazole (PROTONIX) 40 MG tablet  Headache, unspecified headache type - Plan: amitriptyline (ELAVIL) 10 MG tablet  Routine general medical examination at a health care facility  Screening for endocrine, metabolic and immunity disorder - Plan: BASIC METABOLIC PANEL WITH GFR, Hemoglobin A1c  Screening for lipoid disorders - Plan: Lipid panel  Encounter for HCV screening test for low risk patient - Plan: Hepatitis C antibody  Sinus pain  Mild intermittent asthma with acute exacerbation - Plan: albuterol (VENTOLIN HFA) 108 (90 Base) MCG/ACT inhaler  Start Symbicort inh and continue during fall and winter. Start Amitriptyline if headache continues after antibiotic treatment. If difficulty swallowing is not better, we will need GI evaluation.  If you need refills please call your pharmacy. Do not use My Chart to request refills or for acute issues that need immediate attention.   Please be sure medication list is accurate. If a new problem present, please set up appointment sooner than planned today.  At least 150 minutes of moderate exercise per week, daily brisk walking for 15-30 min is a good exercise option. Healthy diet low in saturated (animal) fats and sweets and consisting of fresh fruits and vegetables, lean meats such as fish and white chicken and whole grains.  These are some of recommendations for screening depending of age and risk factors:  - Vaccines:  Tdap vaccine every 10 years.  Shingles vaccine recommended at age 64, could be given after 41 years of age but not sure about insurance coverage.   Pneumonia vaccines: Pneumovax at 60. Sometimes Pneumovax is giving earlier if history of smoking, lung disease,diabetes,kidney disease among some.  Screening for diabetes at age 33 and every 3  years.  Cervical cancer prevention:  Pap smear starts at 41 years of age and continues periodically until 41 years old in low risk women. Pap smear every 3 years between 50 and 67 years old. Pap smear every 3-5 years between women 31 and older if pap smear negative and HPV screening negative.   -Breast cancer: Mammogram: There is disagreement between experts about when to start screening in low risk asymptomatic female but recent recommendations are to start screening at 27 and not later than 41 years old , every 1-2 years and after 41 yo q 2 years. Screening is recommended until 41 years old but some women can continue screening depending of healthy issues.  Colon cancer screening: Has been recently changed to 41 yo. Insurance may not cover until you are 41 years old. Screening is recommended until 41 years old.  Cholesterol disorder screening at age 1 and every 3 years.  Also recommended:  1. Dental visit- Brush and floss your teeth twice daily; visit your dentist twice a year. 2. Eye doctor- Get an eye exam at least every 2 years. 3. Helmet use- Always wear a helmet when riding a bicycle, motorcycle, rollerblading or skateboarding. 4. Safe sex- If you may be exposed to sexually transmitted infections, use a condom. 5. Seat belts- Seat belts can save your live; always wear one. 6. Smoke/Carbon Monoxide detectors- These detectors need to be installed on the appropriate level of your home. Replace batteries at least once a year. 7. Skin cancer- When out in the sun please cover up and use sunscreen 15 SPF or higher. 8. Violence- If anyone is threatening or hurting you,  please tell your healthcare provider.  9. Drink alcohol in moderation- Limit alcohol intake to one drink or less per day. Never drink and drive. 10. Calcium supplementation 1000 to 1200 mg daily, ideally through your diet.  Vitamin D supplementation 800 units daily.

## 2020-10-28 NOTE — Progress Notes (Signed)
HPI: Danielle [name] is a pleasant 39 y.o. female, who is here today for her routine physical. Last CPE: 10/29/19.  Regular exercise 3 or more time per week: Walks 2-3 times per week if weather permits it. Following a healthy diet: Yes. She lives with her husband and 2 children. She works part time.  Chronic medical problems: Asthma,mood disorder,anxiety,and allergic rhinitis.  Pap smear:2017. She sees Dr Julien Girt.  Immunization History  Administered Date(s) Administered  . Influenza Inj Mdck Quad Pf 08/23/2019  . Influenza,inj,Quad PF,6+ Mos 07/29/2018, 08/11/2020  . Influenza-Unspecified 08/31/2017  . PPD Test 11/25/2016, 11/29/2017, 11/14/2018, 10/29/2019, 10/28/2020  . Tdap 11/25/2016   Mammogram: 03/2020. Colonoscopy: N/A DEXA: N/A Hep C screening: Never  She has some concerns today.  Dysphagia: Certain food getting stuck and causing mid retrosternal pain. Gradual onset about 5 years ago. This happens with [place] and spicy food, and Ibuprofen. She has sometimes to make herself vomit to alleviate symptoms. Problem is getting more frequent. GERD: She is on Omeprazole  20 mg daily.  For about a year she has had intermittent headaches getting worse for the past couple weeks. Sometimes occipital and frontal. Headache does not interfere with sleep. Maxillary sinus pressure. She has not noted fever or changes in appetite. No associated visual changes,nausea,or vomiting.  Allergic rhinitis on Zyrtec 10 mg daily and Flonase nasal spray.  Asthma: Usually around this time of the year she uses Albuterol inh more often. Currently asymptomatic.  Review of Systems  Constitutional: Positive for fatigue. Negative for activity change.  HENT: Positive for sinus pain and trouble swallowing. Negative for dental problem, facial swelling, hearing loss, mouth sores, sore throat and voice change.   Eyes: Negative for photophobia and pain.  Respiratory: Negative for cough,  shortness of breath and wheezing.   Cardiovascular: Negative for chest pain and leg swelling.  Gastrointestinal: Negative for abdominal pain and blood in stool.       No changes in bowel habits.  Endocrine: Negative for cold intolerance, heat intolerance, polydipsia, polyphagia and polyuria.  Genitourinary: Negative for decreased urine volume, dysuria, hematuria, vaginal bleeding and vaginal discharge.  Musculoskeletal: Negative for gait problem and myalgias.  Skin: Negative for color change and rash.  Allergic/Immunologic: Positive for environmental allergies.  Neurological: Negative for syncope, facial asymmetry and weakness.  Hematological: Negative for adenopathy. Does not bruise/bleed easily.  Psychiatric/Behavioral: Negative for confusion. The patient is nervous/anxious.   All other systems reviewed and are negative.  Current Outpatient Medications on File Prior to Visit  Medication Sig Dispense Refill  . BIOTIN PO Take by mouth.    . cetirizine (ZYRTEC) 10 MG tablet Take 10 mg by mouth daily as needed.     . clonazePAM (KLONOPIN) 0.5 MG tablet Take 1 tablet (0.5 mg total) by mouth 2 (two) times daily as needed for anxiety. 60 tablet 5  . Multiple Vitamin (MULTIVITAMIN) tablet Take 1 tablet by mouth daily. (Patient not taking: Reported on 10/09/2020)    . traZODone (DESYREL) 50 MG tablet TAKE 1 TO 3 TABS BY MOUTH AT BEDTIME AS DIRECTED 90 tablet 1   No current facility-administered medications on file prior to visit.   Past Medical History:  Diagnosis Date  . Asthma   . Constipation   . Generalized headaches   . Other and unspecified ovarian cysts   . Pulse irregularity   . Rapid heart rate   . Seasonal allergies     Past Surgical History:  Procedure Laterality Date  .  DILATION AND CURETTAGE OF UTERUS  2005    Allergies  Allergen Reactions  . Doxycycline Nausea And Vomiting    Other reaction(s): Vomiting    Family History  Problem Relation Age of Onset  .  Hypertension Mother   . Hypertension Father   . Anxiety disorder Father   . Anxiety disorder Brother   . Depression Brother   . ADD / ADHD Son   . Tourette syndrome Son   . CAD Neg Hx     Social History   Socioeconomic History  . Marital status: Married    Spouse name: Not on file  . Number of children: 2  . Years of education: Not on file  . Highest education level: Not on file  Occupational History  . 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 Smoking status: Never Smoker  . Smokeless tobacco: Never Used  Substance and Sexual Activity  . Alcohol use: Yes    Alcohol/week: 1.0 standard drink    Types: 1 Standard drinks or equivalent per week    Comment: occasionally  . Drug use: No  . Sexual activity: Not on file  Other Topics Concern  . Not on file  Social History Narrative  . Not on file   Social Determinants of Health   Financial Resource Strain:   . Difficulty of Paying Living Expenses: Not on file  Food Insecurity:   . Worried About Charity fundraiser in the Last Year: Not on file  . Ran Out of Food in the Last Year: Not on file  Transportation Needs:   . Lack of Transportation (Medical): Not on file  . Lack of Transportation (Non-Medical): Not on file  Physical Activity:   . Days of Exercise per Week: Not on file  . Minutes of Exercise per Session: Not on file  Stress:   . Feeling of Stress : Not on file  Social Connections:   . Frequency of Communication with Friends and Family: Not on file  . Frequency of Social Gatherings with Friends and Family: Not on file  . Attends Religious Services: Not on file  . Active Member of Clubs or Organizations: Not on file  . Attends Archivist Meetings: Not on file  . Marital Status: Not on file    Vitals:   10/28/20 1002  BP: 120/82  Pulse: 72  Resp: 12  Temp: 98.2 F (36.8 C)  SpO2: 98%   Body mass index is 31.86 kg/m.   Wt Readings from Last 3 Encounters:  10/28/20 174 lb 3.2 oz (79  kg)  03/23/20 169 lb (76.7 kg)  10/29/19 168 lb 6.4 oz (76.4 kg)   Physical Exam Vitals and nursing note reviewed.  Constitutional:      General: She is not in acute distress.    Appearance: She is well-developed.  HENT:     Head: Normocephalic and atraumatic.     Right Ear: Hearing, tympanic membrane, ear canal and external ear normal.     Left Ear: Hearing, tympanic membrane, ear canal and external ear normal.     Nose:     Right Sinus: Maxillary sinus tenderness present.     Left Sinus: Maxillary sinus tenderness present.     Mouth/Throat:     Mouth: Mucous membranes are moist.     Pharynx: Oropharynx is clear. Uvula midline.  Eyes:     Extraocular Movements: Extraocular movements intact.     Conjunctiva/sclera: Conjunctivae normal.     Pupils:  Pupils are equal, round, and reactive to light.  Neck:     Thyroid: No thyromegaly.     Trachea: No tracheal deviation.  Cardiovascular:     Rate and Rhythm: Normal rate and regular rhythm.     Pulses:          Dorsalis pedis pulses are 2+ on the right side and 2+ on the left side.     Heart sounds: No murmur heard.   Pulmonary:     Effort: Pulmonary effort is normal. No respiratory distress.     Breath sounds: Normal breath sounds.  Abdominal:     Palpations: Abdomen is soft. There is no hepatomegaly or mass.     Tenderness: There is no abdominal tenderness.  Genitourinary:    Comments: Deferred to gyn. Musculoskeletal:     Cervical back: No tenderness or bony tenderness. Normal range of motion.     Comments: No major deformity or signs of synovitis appreciated.  Lymphadenopathy:     Cervical: No cervical adenopathy.     Upper Body:     Right upper body: No supraclavicular adenopathy.     Left upper body: No supraclavicular adenopathy.  Skin:    General: Skin is warm.     Findings: No erythema or rash.  Neurological:     Mental Status: She is alert and oriented to person, place, and time.     Cranial Nerves: No cranial  nerve deficit.     Coordination: Coordination normal.     Gait: Gait normal.     Deep Tendon Reflexes:     Reflex Scores:      Bicep reflexes are 2+ on the right side and 2+ on the left side.      Patellar reflexes are 2+ on the right side and 2+ on the left side. Psychiatric:     Comments: Well groomed, good eye contact.   ASSESSMENT AND PLAN:  Danielle [name] was here today annual physical examination.  Orders Placed This Encounter  Procedures  . BASIC METABOLIC PANEL WITH GFR  . Hemoglobin A1c  . Lipid panel  . Hepatitis C antibody  . PPD   Lab Results  Component Value Date   HGBA1C 4.6 10/28/2020   Lab Results  Component Value Date   CHOL 190 10/28/2020   HDL 50 10/28/2020   LDLCALC 118 (H) 10/28/2020   TRIG 110 10/28/2020   CHOLHDL 3.8 10/28/2020   Routine general medical examination at a health care facility She understands the importance of regular physical activity and healthy diet for prevention of chronic illness and/or complications. Preventive guidelines reviewed. Female care to continue with her gyn. Vaccination up to date.  Next CPE in a year.  The 10-year ASCVD risk score Mikey Bussing DC Brooke Bonito., et al., 2013) is: 0.6%   Values used to calculate the score:     Age: 66 years     Sex: Female     Is Non-Hispanic African American: No     Diabetic: No     Tobacco smoker: No     Systolic Blood Pressure: 831 mmHg     Is BP treated: No     HDL Cholesterol: 50 mg/dL     Total Cholesterol: 190 mg/dL  Dysphagia, unspecified type We discussed possible etiologies. She would like to try another PPI before EGD is considered. Protonix 40 mg daily. If not better in 3-4 weeks, will recommend GI evaluation.  -     pantoprazole (PROTONIX) 40 MG tablet;  Take 1 tablet (40 mg total) by mouth daily.  Headache, unspecified headache type Possible causes: ? Tension headache,allergies,sinus among some. If headache is not any better after completing abx treatment, she  will start Amitriptyline 10 mg at ebdtime. We discussed some side effects. Instructed about warning signs.  -     amitriptyline (ELAVIL) 10 MG tablet; Take 1 tablet (10 mg total) by mouth at bedtime.  Need for tuberculosis vaccination -     PPD  Screening for endocrine, metabolic and immunity disorder -     Hemoglobin A1c -     BASIC METABOLIC PANEL WITH GFR  Screening for lipoid disorders -     Lipid panel  Encounter for HCV screening test for low risk patient -     Hepatitis C antibody  Sinus pain Maxillary tenderness could be caused by allergic rhinitis. Because frontal and sinus pressure have been persistent ,she can try course of Augmentin. Continue Flonase nasal spray daily as needed and nasal saline irrigations.  Mild intermittent asthma with acute exacerbation She agrees with starting LABA/ICS during cold weather. Continue Albuterol inh 2 puff q 4-6 hours as needed.  -     budesonide-formoterol (SYMBICORT) 80-4.5 MCG/ACT inhaler; Inhale 2 puffs into the lungs 2 (two) times daily. -     albuterol (VENTOLIN HFA) 108 (90 Base) MCG/ACT inhaler; Inhale 2 puffs into the lungs every 6 (six) hours as needed. For wheeze or shortness of breath  Other orders -     amoxicillin-clavulanate (AUGMENTIN) 875-125 MG tablet; Take 1 tablet by mouth 2 (two) times daily for 7 days.   Return in about 2 months (around 12/28/2020).  Trinetta Alemu G. [place], MD  [facility]. [place] office.   Today you have you routine preventive visit. A few things to remember from today's visit:   Need for tuberculosis vaccination - Plan: PPD  Dysphagia, unspecified type - Plan: pantoprazole (PROTONIX) 40 MG tablet  Headache, unspecified headache type - Plan: amitriptyline (ELAVIL) 10 MG tablet  Routine general medical examination at a health care facility  Screening for endocrine, metabolic and immunity disorder - Plan: BASIC METABOLIC PANEL WITH GFR, Hemoglobin A1c  Screening for lipoid  disorders - Plan: Lipid panel  Encounter for HCV screening test for low risk patient - Plan: Hepatitis C antibody  Sinus pain  Mild intermittent asthma with acute exacerbation - Plan: albuterol (VENTOLIN HFA) 108 (90 Base) MCG/ACT inhaler  Start Symbicort inh and continue during fall and winter. Start Amitriptyline if headache continues after antibiotic treatment. If difficulty swallowing is not better, we will need GI evaluation.  If you need refills please call your pharmacy. Do not use My Chart to request refills or for acute issues that need immediate attention.   Please be sure medication list is accurate. If a new problem present, please set up appointment sooner than planned today.  At least 150 minutes of moderate exercise per week, daily brisk walking for 15-30 min is a good exercise option. Healthy diet low in saturated (animal) fats and sweets and consisting of fresh fruits and vegetables, lean meats such as fish and white chicken and whole grains.  These are some of recommendations for screening depending of age and risk factors:  - Vaccines:  Tdap vaccine every 10 years.  Shingles vaccine recommended at age 55, could be given after 41 years of age but not sure about insurance coverage.   Pneumonia vaccines: Pneumovax at 49. Sometimes Pneumovax is giving earlier if history of smoking, lung  disease,diabetes,kidney disease among some.  Screening for diabetes at age 30 and every 3 years.  Cervical cancer prevention:  Pap smear starts at 41 years of age and continues periodically until 41 years old in low risk women. Pap smear every 3 years between 62 and 66 years old. Pap smear every 3-5 years between women 18 and older if pap smear negative and HPV screening negative.   -Breast cancer: Mammogram: There is disagreement between experts about when to start screening in low risk asymptomatic female but recent recommendations are to start screening at 72 and not later than  41 years old , every 1-2 years and after 41 yo q 2 years. Screening is recommended until 41 years old but some women can continue screening depending of healthy issues.  Colon cancer screening: Has been recently changed to 41 yo. Insurance may not cover until you are 41 years old. Screening is recommended until 41 years old.  Cholesterol disorder screening at age 29 and every 3 years.  Also recommended:  1. Dental visit- Brush and floss your teeth twice daily; visit your dentist twice a year. 2. Eye doctor- Get an eye exam at least every 2 years. 3. Helmet use- Always wear a helmet when riding a bicycle, motorcycle, rollerblading or skateboarding. 4. Safe sex- If you may be exposed to sexually transmitted infections, use a condom. 5. Seat belts- Seat belts can save your live; always wear one. 6. Smoke/Carbon Monoxide detectors- These detectors need to be installed on the appropriate level of your home. Replace batteries at least once a year. 7. Skin cancer- When out in the sun please cover up and use sunscreen 15 SPF or higher. 8. Violence- If anyone is threatening or hurting you, please tell your healthcare provider.  9. Drink alcohol in moderation- Limit alcohol intake to one drink or less per day. Never drink and drive. 10. Calcium supplementation 1000 to 1200 mg daily, ideally through your diet.  Vitamin D supplementation 800 units daily.

## 2020-10-29 LAB — LIPID PANEL
Cholesterol: 190 mg/dL (ref <200)
HDL: 50 mg/dL (ref >=50)
LDL Cholesterol (Calc): 118 mg/dL (calc) — ABNORMAL HIGH
Non-HDL Cholesterol (Calc): 140 mg/dL (calc) — ABNORMAL HIGH (ref <130)
Total CHOL/HDL Ratio: 3.8 (calc) (ref <5.0)
Triglycerides: 110 mg/dL (ref <150)

## 2020-10-29 LAB — BASIC METABOLIC PANEL WITH GFR
BUN: 9 mg/dL (ref 7–25)
CO2: 22 mmol/L (ref 20–32)
Calcium: 9.9 mg/dL (ref 8.6–10.2)
Chloride: 104 mmol/L (ref 98–110)
Creat: 0.69 mg/dL (ref 0.50–1.10)
GFR, Est African American: 126 mL/min/{1.73_m2} (ref >=60)
GFR, Est Non African American: 109 mL/min/{1.73_m2} (ref >=60)
Glucose, Bld: 83 mg/dL (ref 65–99)
Potassium: 4.1 mmol/L (ref 3.5–5.3)
Sodium: 139 mmol/L (ref 135–146)

## 2020-10-29 LAB — HEMOGLOBIN A1C
Hgb A1c MFr Bld: 4.6 % of total Hgb (ref <5.7)
Mean Plasma Glucose: 85 (calc)
eAG (mmol/L): 4.7 (calc)

## 2020-10-29 LAB — HEPATITIS C ANTIBODY
Hepatitis C Ab: NONREACTIVE
SIGNAL TO CUT-OFF: 0.01 (ref <1.00)

## 2020-10-30 LAB — TB SKIN TEST
Induration: 0 mm
TB Skin Test: NEGATIVE

## 2020-10-31 ENCOUNTER — Encounter: Payer: Self-pay | Admitting: Family Medicine

## 2020-11-03 ENCOUNTER — Encounter: Payer: BC Managed Care – PPO | Admitting: Family Medicine

## 2020-11-17 DIAGNOSIS — F330 Major depressive disorder, recurrent, mild: Secondary | ICD-10-CM | POA: Diagnosis not present

## 2020-11-17 DIAGNOSIS — F411 Generalized anxiety disorder: Secondary | ICD-10-CM | POA: Diagnosis not present

## 2020-11-23 ENCOUNTER — Other Ambulatory Visit: Payer: Self-pay

## 2020-11-23 DIAGNOSIS — R519 Headache, unspecified: Secondary | ICD-10-CM

## 2020-11-23 MED ORDER — AMITRIPTYLINE HCL 10 MG PO TABS: 10.0000 mg | ORAL_TABLET | Freq: Every day | ORAL | 1 refills | Status: DC

## 2020-12-16 DIAGNOSIS — F330 Major depressive disorder, recurrent, mild: Secondary | ICD-10-CM | POA: Diagnosis not present

## 2020-12-16 DIAGNOSIS — F411 Generalized anxiety disorder: Secondary | ICD-10-CM | POA: Diagnosis not present

## 2020-12-24 DIAGNOSIS — Z20822 Contact with and (suspected) exposure to covid-19: Secondary | ICD-10-CM | POA: Diagnosis not present

## 2020-12-24 DIAGNOSIS — U071 COVID-19: Secondary | ICD-10-CM | POA: Diagnosis not present

## 2020-12-30 DIAGNOSIS — Z03818 Encounter for observation for suspected exposure to other biological agents ruled out: Secondary | ICD-10-CM | POA: Diagnosis not present

## 2020-12-30 DIAGNOSIS — Z20822 Contact with and (suspected) exposure to covid-19: Secondary | ICD-10-CM | POA: Diagnosis not present

## 2021-01-04 DIAGNOSIS — Z20822 Contact with and (suspected) exposure to covid-19: Secondary | ICD-10-CM | POA: Diagnosis not present

## 2021-01-04 DIAGNOSIS — Z03818 Encounter for observation for suspected exposure to other biological agents ruled out: Secondary | ICD-10-CM | POA: Diagnosis not present

## 2021-01-13 DIAGNOSIS — F330 Major depressive disorder, recurrent, mild: Secondary | ICD-10-CM | POA: Diagnosis not present

## 2021-01-13 DIAGNOSIS — F411 Generalized anxiety disorder: Secondary | ICD-10-CM | POA: Diagnosis not present

## 2021-01-15 ENCOUNTER — Ambulatory Visit: Payer: BC Managed Care – PPO | Admitting: Psychiatry

## 2021-01-20 ENCOUNTER — Other Ambulatory Visit: Payer: Self-pay | Admitting: Family Medicine

## 2021-01-20 DIAGNOSIS — R1310 Dysphagia, unspecified: Secondary | ICD-10-CM

## 2021-02-04 ENCOUNTER — Telehealth: Payer: Self-pay | Admitting: Family Medicine

## 2021-02-04 NOTE — Telephone Encounter (Signed)
Patient is calling and wanted to see if she can come pick up a copy of her recent TB skin test, please advise. CB is [phone]

## 2021-02-05 NOTE — Telephone Encounter (Signed)
Patient is aware that a copy of the recent TB test is up front for her to pick up.

## 2021-02-10 DIAGNOSIS — F411 Generalized anxiety disorder: Secondary | ICD-10-CM | POA: Diagnosis not present

## 2021-02-10 DIAGNOSIS — F330 Major depressive disorder, recurrent, mild: Secondary | ICD-10-CM | POA: Diagnosis not present

## 2021-02-26 ENCOUNTER — Encounter: Payer: Self-pay | Admitting: Psychiatry

## 2021-02-26 ENCOUNTER — Other Ambulatory Visit: Payer: Self-pay

## 2021-02-26 ENCOUNTER — Ambulatory Visit (INDEPENDENT_AMBULATORY_CARE_PROVIDER_SITE_OTHER): Payer: BC Managed Care – PPO | Admitting: Psychiatry

## 2021-02-26 DIAGNOSIS — F5105 Insomnia due to other mental disorder: Secondary | ICD-10-CM | POA: Diagnosis not present

## 2021-02-26 DIAGNOSIS — F411 Generalized anxiety disorder: Secondary | ICD-10-CM

## 2021-02-26 DIAGNOSIS — F410 Panic disorder [episodic paroxysmal anxiety] without agoraphobia: Secondary | ICD-10-CM

## 2021-02-26 DIAGNOSIS — F99 Mental disorder, not otherwise specified: Secondary | ICD-10-CM | POA: Diagnosis not present

## 2021-02-26 MED ORDER — CLONAZEPAM 0.5 MG PO TABS: 0.5000 mg | ORAL_TABLET | Freq: Two times a day (BID) | ORAL | 3 refills | Status: DC | PRN

## 2021-02-26 MED ORDER — TRAZODONE HCL 50 MG PO TABS: ORAL_TABLET | ORAL | 1 refills | Status: DC

## 2021-02-26 NOTE — Progress Notes (Signed)
Danielle [name] [id number] 1981-07-21 40 y.o.  Subjective:   Patient ID:  Danielle [name] is a 40 y.o. (DOB 23-Jul-1981) female.  Chief Complaint:  Chief Complaint  Patient presents with  . Follow-up    Anxiety, Depression, and insomnia    HPI Danielle [name] presents to the office today for follow-up of anxiety, depression, and insomnia. Stopped Trintellix and takes Klonopin prn and Trazodone. She reports that PCP started Amitriptyline on 11/23/20 for headaches and she reports that this has also helped some with her mood. She reports that last severe depressive episode was in September. She reports that she has occasional days of lower mood. She reports that her anxiety has been consistent with her baseline. She denies any full blown panic attacks. She reports that she will feel "on the verge" of panic. She reports that anxiety is "ever present" with constant worry. She reports that she is "exhausted" by 8 or 9 pm. Sleeping ok once she falls asleep. Appetite has been the same. She reports that wt has reached plateau. She reports that her energy and motivation have been "decent." She reports that she is doing the things she needs to do for her children, household, and work. She reports that she does not have as much energy and motivation for things that are not required. She reports adequate concentration. Denies SI.   Has been walking more and enjoys this.   She reports that Klonopin prn usage varies depending on current stressors. She reports that she is taking it now about 3 times a week.   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 She went over a month without being paid at her last job.   Daughter that is 51 yo had depressive episode. Daughter saw provider in this practice. She reports that both of her children are in therapy. She reports that her husband and parents do not help with picking up children and meeting their needs.   She saw  a therapist from July until about 2 weeks ago. She reports that this was helpful. Trying hypnosis   Past Psychiatric Medication Trials: Abilify- Tremor Latuda Vraylar- Irritability, Agitation Rexulti- Palpitations Lamictal Viibryd- panic Trintellix- HA's, possible headaches. Prozac Effexor XR Zoloft Lexapro Wellbutrin- Headaches Buspar Xanax- excessive somnolence, ineffective Clorazepate- ineffective Klonopin Ativan Trileptal-ineffective Trazodone- Has had dizziness and nausea with doses above 100 mg. Deplin Propranolol-Ineffective Clonidine- May have been helpful for panic s/s. Gabapentin- Noticed restlessness with 900 mg. Amitriptyline- Helpful for headaches and mood.    [facility] Visit from 01/10/2017 in Primary Care at [place] from 10/07/2016 in Primary Care at [place] from 05/13/2016 in Primary Care at [facility] Total Score 0 1 0       Review of Systems:  Review of Systems  Musculoskeletal: Negative for gait problem.  Neurological: Negative for tremors.       Headaches have decreased with Amitriptyline  Psychiatric/Behavioral:       Please refer to HPI    Medications: I have reviewed the patient's current medications.  Current Outpatient Medications  Medication Sig Dispense Refill  . pantoprazole (PROTONIX) 40 MG tablet TAKE 1 TABLET BY MOUTH EVERY DAY 90 tablet 1  . albuterol (VENTOLIN HFA) 108 (90 Base) MCG/ACT inhaler Inhale 2 puffs into the lungs every 6 (six) hours as needed. For wheeze or shortness of breath 8.5 g 2  . amitriptyline (ELAVIL) 10 MG  tablet Take 1 tablet (10 mg total) by mouth at bedtime. 90 tablet 1  . BIOTIN PO Take by mouth.    . budesonide-formoterol (SYMBICORT) 80-4.5 MCG/ACT inhaler Inhale 2 puffs into the lungs 2 (two) times daily. 1 each 3  . cetirizine (ZYRTEC) 10 MG tablet Take 10 mg by mouth daily as needed.     . clonazePAM (KLONOPIN) 0.5 MG tablet Take 1 tablet (0.5 mg total) by  mouth 2 (two) times daily as needed for anxiety. 60 tablet 3  . Multiple Vitamin (MULTIVITAMIN) tablet Take 1 tablet by mouth daily. (Patient not taking: Reported on 10/09/2020)    . traZODone (DESYREL) 50 MG tablet TAKE 1/2 TO 1 TAB BY MOUTH AT BEDTIME AS DIRECTED 90 tablet 1   No current facility-administered medications for this visit.    Medication Side Effects: None  Allergies:  Allergies  Allergen Reactions  . Doxycycline Nausea And Vomiting    Other reaction(s): Vomiting    Past Medical History:  Diagnosis Date  . Asthma   . Constipation   . Generalized headaches   . Other and unspecified ovarian cysts   . Pulse irregularity   . Rapid heart rate   . Seasonal allergies     Family History  Problem Relation Age of Onset  . Hypertension Mother   . Hypertension Father   . Anxiety disorder Father   . Anxiety disorder Brother   . Depression Brother   . ADD / ADHD Son   . Tourette syndrome Son   . Depression Daughter   . CAD Neg Hx     Social History   Socioeconomic History  . Marital status: Married    Spouse name: Not on file  . Number of children: 2  . Years of education: Not on file  . Highest education level: Not on file  Occupational History  . 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 Smoking status: Never Smoker  . Smokeless tobacco: Never Used  Substance and Sexual Activity  . Alcohol use: Yes    Alcohol/week: 1.0 standard drink    Types: 1 Standard drinks or equivalent per week    Comment: occasionally  . Drug use: No  . Sexual activity: Not on file  Other Topics Concern  . Not on file  Social History Narrative  . Not on file   Social Determinants of Health   Financial Resource Strain: Not on file  Food Insecurity: Not on file  Transportation Needs: Not on file  Physical Activity: Not on file  Stress: Not on file  Social Connections: Not on file  Intimate Partner Violence: Not on file    Past Medical History, Surgical history,  Social history, and Family history were reviewed and updated as appropriate.   Please see review of systems for further details on the patient's review from today.   Objective:   Physical Exam:  There were no vitals taken for this visit.  Physical Exam  Lab Review:     Component Value Date/Time   NA 139 10/28/2020 1118   K 4.1 10/28/2020 1118   CL 104 10/28/2020 1118   CO2 22 10/28/2020 1118   GLUCOSE 83 10/28/2020 1118   BUN 9 10/28/2020 1118   CREATININE 0.69 10/28/2020 1118   CALCIUM 9.9 10/28/2020 1118   PROT 7.1 11/25/2016 0953   ALBUMIN 4.5 11/25/2016 0953   AST 15 11/25/2016 0953   ALT 15 11/25/2016 0953   ALKPHOS 71 11/25/2016 0953   BILITOT  0.8 11/25/2016 0953   GFRNONAA 109 10/28/2020 1118   GFRAA 126 10/28/2020 1118       Component Value Date/Time   WBC 12.5 (H) 10/02/2012 0006   RBC 4.51 10/02/2012 0006   HGB 14.2 10/02/2012 0006   HCT 38.5 10/02/2012 0006   PLT 214 10/02/2012 0006   MCV 85.4 10/02/2012 0006   MCH 31.5 10/02/2012 0006   MCHC 36.9 (H) 10/02/2012 0006   RDW 12.0 10/02/2012 0006    No results found for: POCLITH, LITHIUM   No results found for: PHENYTOIN, PHENOBARB, VALPROATE, CBMZ   .res Assessment: Plan:   Pt seen for 30 minutes. 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 Pt is amenable to therapy and is referred to [facility].  Lisha reports that she would like to try reducing Trazodone from 50 mg to 25 mg if possible. Will change Trazodone script to 50 mg 1/2-1 tab po QHS for insomnia and depression.  Continue Klonopin prn anxiety.  Pt to follow-up in 6 months or sooner if clinically indicated.  Patient advised to contact office with any questions, adverse effects, or acute worsening in signs and symptoms.   Cosette was seen today for follow-up.  Diagnoses and all orders for this visit:  Insomnia due to other mental disorder -     traZODone (DESYREL)  50 MG tablet; TAKE 1/2 TO 1 TAB BY MOUTH AT BEDTIME AS DIRECTED -     clonazePAM (KLONOPIN) 0.5 MG tablet; Take 1 tablet (0.5 mg total) by mouth 2 (two) times daily as needed for anxiety.  Panic disorder -     clonazePAM (KLONOPIN) 0.5 MG tablet; Take 1 tablet (0.5 mg total) by mouth 2 (two) times daily as needed for anxiety.  Generalized anxiety disorder -     clonazePAM (KLONOPIN) 0.5 MG tablet; Take 1 tablet (0.5 mg total) by mouth 2 (two) times daily as needed for anxiety.     Please see After Visit Summary for patient specific instructions.  Future Appointments  Date Time Provider [place]  04/21/2021 11:00 AM Lina Sayre, [facility] CP-CP None  09/03/2021 10:00 AM Thayer Headings, PM[facility]NP CP-CP None    No orders of the defined types were placed in this encounter.   -------------------------------

## 2021-04-16 ENCOUNTER — Other Ambulatory Visit: Payer: Self-pay | Admitting: Family Medicine

## 2021-04-16 DIAGNOSIS — R519 Headache, unspecified: Secondary | ICD-10-CM

## 2021-04-20 ENCOUNTER — Encounter: Payer: Self-pay | Admitting: Family Medicine

## 2021-04-20 ENCOUNTER — Other Ambulatory Visit: Payer: Self-pay

## 2021-04-20 ENCOUNTER — Ambulatory Visit: Payer: BC Managed Care – PPO | Admitting: Family Medicine

## 2021-04-20 VITALS — BP 118/76 | HR 59 | Temp 98.3°F | Wt 180.6 lb

## 2021-04-20 DIAGNOSIS — M5442 Lumbago with sciatica, left side: Secondary | ICD-10-CM | POA: Diagnosis not present

## 2021-04-20 MED ORDER — METHYLPREDNISOLONE 4 MG PO TBPK: ORAL_TABLET | ORAL | 0 refills | Status: DC

## 2021-04-20 NOTE — Progress Notes (Signed)
   Subjective:    Patient ID: Danielle [name], female    DOB: Nov 10, 1981, 40 y.o.   MRN: [id number]  HPI Here for 10 days of sharp pain in the lower left back that radiates into the left buttock. No numbness or weakness in the legs. She thinks this started after she spent time planting a garden. She is using heat and Ibuprofen.    Review of Systems  Constitutional: Negative.   Respiratory: Negative.   Cardiovascular: Negative.   Musculoskeletal: Positive for back pain.       Objective:   Physical Exam Constitutional:      General: She is not in acute distress.    Appearance: Normal appearance.  Cardiovascular:     Rate and Rhythm: Normal rate and regular rhythm.     Pulses: Normal pulses.     Heart sounds: Normal heart sounds.  Pulmonary:     Effort: Pulmonary effort is normal.     Breath sounds: Normal breath sounds.  Musculoskeletal:     Comments: Tender in the left lower back and over the left sciatic notch ROM is full    Neurological:     Mental Status: She is alert.           Assessment & Plan:  Low back pain, treat with a Medrol dose pack. Recheck as needed.  Alysia Penna, MD

## 2021-04-21 ENCOUNTER — Ambulatory Visit (INDEPENDENT_AMBULATORY_CARE_PROVIDER_SITE_OTHER): Payer: BC Managed Care – PPO | Admitting: Psychiatry

## 2021-04-21 ENCOUNTER — Encounter: Payer: Self-pay | Admitting: Psychiatry

## 2021-04-21 DIAGNOSIS — F411 Generalized anxiety disorder: Secondary | ICD-10-CM
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
## 2021-05-03 ENCOUNTER — Telehealth: Payer: Self-pay | Admitting: Family Medicine

## 2021-05-03 NOTE — Telephone Encounter (Signed)
She should have a f/u appt. I really do not feel comfortable repeating oral steroids given the fact treatment did not seem to help. We may need to discuss the need for imaging or ortho evaluation. Thanks, BJ

## 2021-05-03 NOTE — Telephone Encounter (Signed)
I called and spoke with patient. Appointment scheduled for 12pm tomorrow.

## 2021-05-03 NOTE — Telephone Encounter (Signed)
Patient is calling back and stated that she was seen on 5/10 for left lower back pain and still is not getting any relief. Per patient she was taking methylPREDNISolone (MEDROL DOSEPAK) 4 MG TBPK tablet and medication was helping but soon as she stopped the pain started again. Patient wanted to see what the provider recommended, please advise. CB is [phone]

## 2021-05-04 ENCOUNTER — Other Ambulatory Visit: Payer: Self-pay

## 2021-05-04 ENCOUNTER — Ambulatory Visit: Payer: BC Managed Care – PPO | Admitting: Family Medicine

## 2021-05-04 ENCOUNTER — Encounter: Payer: Self-pay | Admitting: Family Medicine

## 2021-05-04 VITALS — BP 120/70 | HR 96 | Resp 12 | Ht 62.0 in | Wt 180.0 lb

## 2021-05-04 DIAGNOSIS — M545 Low back pain, unspecified: Secondary | ICD-10-CM | POA: Diagnosis not present

## 2021-05-04 DIAGNOSIS — M461 Sacroiliitis, not elsewhere classified: Secondary | ICD-10-CM | POA: Diagnosis not present

## 2021-05-04 MED ORDER — PREDNISONE 20 MG PO TABS: ORAL_TABLET | ORAL | 0 refills | Status: DC

## 2021-05-04 NOTE — Progress Notes (Signed)
Chief Complaint  Patient presents with  . Follow-up    Back pain    HPI: [name] is a 40 y.o. female with hx of anxiety,mood disorder,asthma, and seasonal allergies here today complaining of persistent left lower back pain radiated to left buttock area. She was evaluated for similar problem on 04/20/21 and started on Medrol dose pack, which was helping but pain got worse again after completing treatment. Negative for recent injury but she has been working on her garden. Last year, 03/23/20, she had right lower back pain radiated to RLE, resolved with Prednisone taper.  Pain is sharp like,mild to moderate, and with no associated LE numbness, tingling, urinary incontinence or retention, stool incontinence, or saddle anesthesia.  Exacerbated by turning and bending over. Alleviated by rest. Negative for rash or edema on area, fever, chills, or abnormal wt loss.  OTC medications: Ibuprofen 800 mg tid, which helps   She is planning on leaving town in a few days, [place]: [place] and then to [place] [place], afraid that pain gets worse while she is out of town. She has tolerated Prednisone well in the past, she would like to try another taper.  Review of Systems  Constitutional: Positive for activity change. Negative for appetite change.  Gastrointestinal: Negative for abdominal pain, nausea and vomiting.  Genitourinary: Negative for decreased urine volume, dysuria and hematuria.  Musculoskeletal: Positive for arthralgias. Negative for gait problem.  Skin: Negative for color change and pallor.  Rest see pertinent positives and negatives per HPI.  Current Outpatient Medications on File Prior to Visit  Medication Sig Dispense Refill  . albuterol (VENTOLIN HFA) 108 (90 Base) MCG/ACT inhaler Inhale 2 puffs into the lungs every 6 (six) hours as needed. For wheeze or shortness of breath 8.5 g 2  . amitriptyline (ELAVIL) 10 MG tablet TAKE 1 TABLET BY MOUTH EVERYDAY AT BEDTIME 90 tablet  1  . BIOTIN PO Take by mouth.    . cetirizine (ZYRTEC) 10 MG tablet Take 10 mg by mouth daily as needed.     . Multiple Vitamin (MULTIVITAMIN) tablet Take 1 tablet by mouth daily.    . pantoprazole (PROTONIX) 40 MG tablet TAKE 1 TABLET BY MOUTH EVERY DAY 90 tablet 1  . budesonide-formoterol (SYMBICORT) 80-4.5 MCG/ACT inhaler Inhale 2 puffs into the lungs 2 (two) times daily. (Patient not taking: No sig reported) 1 each 3  . clonazePAM (KLONOPIN) 0.5 MG tablet Take 1 tablet (0.5 mg total) by mouth 2 (two) times daily as needed for anxiety. 60 tablet 3  . traZODone (DESYREL) 50 MG tablet TAKE 1/2 TO 1 TAB BY MOUTH AT BEDTIME AS DIRECTED (Patient not taking: No sig reported) 90 tablet 1   No current facility-administered medications on file prior to visit.   Past Medical History:  Diagnosis Date  . Asthma   . Constipation   . Generalized headaches   . Other and unspecified ovarian cysts   . Pulse irregularity   . Rapid heart rate   . Seasonal allergies    Allergies  Allergen Reactions  . Doxycycline Nausea And Vomiting    Other reaction(s): Vomiting    Social History   Socioeconomic History  . Marital status: Married    Spouse name: Not on file  . Number of children: 2  . Years of education: Not on file  . Highest education level: Not on file  Occupational History  . 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 Smoking status: Never Smoker  .  Smokeless tobacco: Never Used  Vaping Use  . Vaping Use: Never used  Substance and Sexual Activity  . Alcohol use: Yes    Alcohol/week: 1.0 standard drink    Types: 1 Standard drinks or equivalent per week    Comment: occasionally  . Drug use: No  . Sexual activity: Not on file  Other Topics Concern  . Not on file  Social History Narrative  . Not on file   Social Determinants of Health   Financial Resource Strain: Not on file  Food Insecurity: Not on file  Transportation Needs: Not on file  Physical Activity: Not on file   Stress: Not on file  Social Connections: Not on file    Vitals:   05/04/21 1205  BP: 120/70  Pulse: 96  Resp: 12  SpO2: 97%   Body mass index is 32.92 kg/m.  Physical Exam Vitals and nursing note reviewed.  Constitutional:      General: She is not in acute distress.    Appearance: She is well-developed. She is not ill-appearing.  HENT:     Head: Normocephalic and atraumatic.  Eyes:     Conjunctiva/sclera: Conjunctivae normal.  Cardiovascular:     Rate and Rhythm: Normal rate and regular rhythm.     Pulses:          Dorsalis pedis pulses are 2+ on the right side and 2+ on the left side.     Heart sounds: No murmur heard.   Pulmonary:     Effort: Pulmonary effort is normal. No respiratory distress.     Breath sounds: Normal breath sounds.  Abdominal:     Palpations: Abdomen is soft. There is no hepatomegaly or mass.     Tenderness: There is no abdominal tenderness.  Musculoskeletal:     Thoracic back: No tenderness or bony tenderness.     Lumbar back: Tenderness present. No bony tenderness. Negative right straight leg raise test and negative left straight leg raise test.       Back:     Right hip: No tenderness or bony tenderness. Normal range of motion.     Left hip: No tenderness or bony tenderness. Normal range of motion.     Comments: No significant deformity appreciated. Pain is not elicited with movement on exam table during examination. No local edema or erythema appreciated, no suspicious lesions.  Skin:    General: Skin is warm.     Findings: No erythema or rash.  Neurological:     General: No focal deficit present.     Mental Status: She is alert and oriented to person, place, and time.     Gait: Gait normal.     Deep Tendon Reflexes:     Reflex Scores:      Patellar reflexes are 2+ on the right side and 2+ on the left side.    Comments: Able to walk on tip toes and heels without limitations or pain.  Psychiatric:     Comments: Well groomed, good  eye contact.   ASSESSMENT AND PLAN:  [name] was seen today for follow-up.  Diagnoses and all orders for this visit:  Left low back pain, unspecified chronicity, unspecified whether sciatica present Acute. We discussed possible etiologies. Hx suggest sacroiliitis. Continue Ibuprofen 400-600 mg tid with food and prn. We discussed some side effects. For now I do not think imaging is needed. She will let me know if pain is not resolved in 4-6 weeks.  Sacroiliitis ([place]) We discussed Dx,prognosis,and treatment  options. I do not do SI joint injections. Given the fact she noted some improvement with oral steroids and even though it does not seem to be similar pain she had in 03/2020, I agreed with trying Prednisone taper.Some side effects discussed, take it with breakfast and not at the same time that Ibuprofen. Sport medicine/ortho will be recommended of problem is not resolved or recurrent.  -     predniSONE (DELTASONE) 20 MG tablet; 3 tabs for 3 days, 2 tabs for 3 days, 1 tabs for 3 days, and 1/2 tab for 3 days. Take tables together with breakfast.  Return if symptoms worsen or fail to improve.   Mercie Balsley G. [place], MD  [facility]. [place] office.    A few things to remember from today's visit:   Sacroiliitis ([place])  Left low back pain, unspecified chronicity, unspecified whether sciatica present  If you need refills please call your pharmacy. Do not use My Chart to request refills or for acute issues that need immediate attention.   Take Prednisone with breakfast and not at the same time with Ibuprofen. If not better we will need ortho evaluation.  Sacroiliac Joint Dysfunction  Sacroiliac joint dysfunction is a condition that causes inflammation on one or both sides of the sacroiliac (SI) joint. The SI joint is the joint between two bones of the pelvis called the sacrum and the ilium. The sacrum is the bone at the base of the spine. The ilium is the large bone  that forms the hip. This condition causes deep aching or burning pain in the low back. In some cases, the pain may also spread into one or both buttocks, hips, or thighs. What are the causes? This condition may be caused by:  Pregnancy. During pregnancy, extra stress is put on the SI joints because the pelvis widens.  Injury, such as: ? Injuries from car crashes. ? Sports-related injuries. ? Work-related injuries.  Having one leg that is shorter than the other.  Conditions that affect the joints, such as: ? Rheumatoid arthritis. ? Gout. ? Psoriatic arthritis. ? Joint infection (septic arthritis). Sometimes, the cause of SI joint dysfunction is not known. What are the signs or symptoms? Symptoms of this condition include:  Aching or burning pain in the lower back. The pain may also spread to other areas, such as: ? Buttocks. ? Groin. ? Thighs.  Muscle spasms in or around the painful areas.  Increased pain when standing, walking, running, stair climbing, bending, or lifting. How is this diagnosed? This condition is diagnosed with a physical exam and your medical history. During the exam, the health care provider may move one or both of your legs to different positions to check for pain. Various tests may be done to confirm the diagnosis, including:  Imaging tests to look for other causes of pain. These may include: ? MRI. ? CT scan. ? Bone scan.  Diagnostic injection. A numbing medicine is injected into the SI joint using a needle. If your pain is temporarily improved or stopped after the injection, this can indicate that SI joint dysfunction is the problem. How is this treated? Treatment depends on the cause and severity of your condition. Treatment options can be noninvasive and may include:  Ice or heat applied to the lower back area after an injury. This may help reduce pain and muscle spasms.  Medicines to relieve pain or inflammation or to relax the muscles.  Wearing  a back brace (sacroiliac brace) to help support the  joint while your back is healing.  Physical therapy to increase muscle strength around the joint and flexibility at the joint. This may also involve learning proper body positions and ways of moving to relieve stress on the joint.  Direct manipulation of the SI joint.  Use of a device that provides electrical stimulation to help reduce pain at the joint. Other treatments may include:  Injections of steroid medicine into the joint to reduce pain and swelling.  Radiofrequency ablation. This treatment uses heat to burn away nerves that are carrying pain messages from the joint.  Surgery to put in screws and plates that limit or prevent joint motion. This is rare. Follow these instructions at home: Medicines  Take over-the-counter and prescription medicines only as told by your health care provider.  Ask your health care provider if the medicine prescribed to you: ? Requires you to avoid driving or using machinery. ? Can cause constipation. You may need to take these actions to prevent or treat constipation:  Drink enough fluid to keep your urine pale yellow.  Take over-the-counter or prescription medicines.  Eat foods that are high in fiber, such as beans, whole grains, and fresh fruits and vegetables.  Limit foods that are high in fat and processed sugars, such as fried or sweet foods. If you have a brace:  Wear the brace as told by your health care provider. Remove it only as told by your health care provider.  Keep the brace clean.  If the brace is not waterproof: ? Do not let it get wet. ? Cover it with a watertight covering when you take a bath or a shower. Managing pain, stiffness, and swelling  Icing can help with pain and swelling. Heat may help with muscle tension or spasms. Ask your health care provider if you should use ice or heat.  If directed, put ice on the affected area: ? If you have a removable brace,  remove it as told by your health care provider. ? Put ice in a plastic bag. ? Place a towel between your skin and the bag. ? Leave the ice on for 20 minutes, 2-3 times a day. ? Remove the ice if your skin turns bright red. This is very important. If you cannot feel pain, heat, or cold, you have a greater risk of damage to the area.  If directed, apply heat to the affected area as often as told by your health care provider. Use the heat source that your health care provider recommends, such as a moist heat pack or a heating pad. ? Place a towel between your skin and the heat source. ? Leave the heat on for 20-30 minutes. ? Remove the heat if your skin turns bright red. This is especially important if you are unable to feel pain, heat, or cold. You may have a greater risk of getting burned.      General instructions  Rest as needed. Return to your normal activities as told by your health care provider. Ask your health care provider what activities are safe for you.  Do exercises as told by your health care provider or physical therapist.  Keep all follow-up visits. This is important. Contact a health care provider if:  Your pain is not controlled with medicine.  You have a fever.  Your pain is getting worse. Get help right away if:  You have weakness, numbness, or tingling in your legs or feet.  You lose control of your bladder or bowels. Summary  Sacroiliac (SI) joint dysfunction is a condition that causes inflammation on one or both sides of the SI joint.  This condition causes deep aching or burning pain in the low back. In some cases, the pain may also spread into one or both buttocks, hips, or thighs.  Treatment depends on the cause and severity of your condition. It may include medicines to reduce pain and swelling or to relax muscles. This information is not intended to replace advice given to you by your health care provider. Make sure you discuss any questions you have  with your health care provider. Document Revised: 04/09/2020 Document Reviewed: 04/09/2020 Elsevier Patient Education  [place].  Please be sure medication list is accurate. If a new problem present, please set up appointment sooner than planned today.

## 2021-05-04 NOTE — Patient Instructions (Signed)
A few things to remember from today's visit:   Sacroiliitis ([place])  Left low back pain, unspecified chronicity, unspecified whether sciatica present  If you need refills please call your pharmacy. Do not use My Chart to request refills or for acute issues that need immediate attention.   Take Prednisone with breakfast and not at the same time with Ibuprofen. If not better we will need ortho evaluation.  Sacroiliac Joint Dysfunction  Sacroiliac joint dysfunction is a condition that causes inflammation on one or both sides of the sacroiliac (SI) joint. The SI joint is the joint between two bones of the pelvis called the sacrum and the ilium. The sacrum is the bone at the base of the spine. The ilium is the large bone that forms the hip. This condition causes deep aching or burning pain in the low back. In some cases, the pain may also spread into one or both buttocks, hips, or thighs. What are the causes? This condition may be caused by:  Pregnancy. During pregnancy, extra stress is put on the SI joints because the pelvis widens.  Injury, such as: ? Injuries from car crashes. ? Sports-related injuries. ? Work-related injuries.  Having one leg that is shorter than the other.  Conditions that affect the joints, such as: ? Rheumatoid arthritis. ? Gout. ? Psoriatic arthritis. ? Joint infection (septic arthritis). Sometimes, the cause of SI joint dysfunction is not known. What are the signs or symptoms? Symptoms of this condition include:  Aching or burning pain in the lower back. The pain may also spread to other areas, such as: ? Buttocks. ? Groin. ? Thighs.  Muscle spasms in or around the painful areas.  Increased pain when standing, walking, running, stair climbing, bending, or lifting. How is this diagnosed? This condition is diagnosed with a physical exam and your medical history. During the exam, the health care provider may move one or both of your legs to different  positions to check for pain. Various tests may be done to confirm the diagnosis, including:  Imaging tests to look for other causes of pain. These may include: ? MRI. ? CT scan. ? Bone scan.  Diagnostic injection. A numbing medicine is injected into the SI joint using a needle. If your pain is temporarily improved or stopped after the injection, this can indicate that SI joint dysfunction is the problem. How is this treated? Treatment depends on the cause and severity of your condition. Treatment options can be noninvasive and may include:  Ice or heat applied to the lower back area after an injury. This may help reduce pain and muscle spasms.  Medicines to relieve pain or inflammation or to relax the muscles.  Wearing a back brace (sacroiliac brace) to help support the joint while your back is healing.  Physical therapy to increase muscle strength around the joint and flexibility at the joint. This may also involve learning proper body positions and ways of moving to relieve stress on the joint.  Direct manipulation of the SI joint.  Use of a device that provides electrical stimulation to help reduce pain at the joint. Other treatments may include:  Injections of steroid medicine into the joint to reduce pain and swelling.  Radiofrequency ablation. This treatment uses heat to burn away nerves that are carrying pain messages from the joint.  Surgery to put in screws and plates that limit or prevent joint motion. This is rare. Follow these instructions at home: Medicines  Take over-the-counter and prescription medicines only  as told by your health care provider.  Ask your health care provider if the medicine prescribed to you: ? Requires you to avoid driving or using machinery. ? Can cause constipation. You may need to take these actions to prevent or treat constipation:  Drink enough fluid to keep your urine pale yellow.  Take over-the-counter or prescription medicines.  Eat  foods that are high in fiber, such as beans, whole grains, and fresh fruits and vegetables.  Limit foods that are high in fat and processed sugars, such as fried or sweet foods. If you have a brace:  Wear the brace as told by your health care provider. Remove it only as told by your health care provider.  Keep the brace clean.  If the brace is not waterproof: ? Do not let it get wet. ? Cover it with a watertight covering when you take a bath or a shower. Managing pain, stiffness, and swelling  Icing can help with pain and swelling. Heat may help with muscle tension or spasms. Ask your health care provider if you should use ice or heat.  If directed, put ice on the affected area: ? If you have a removable brace, remove it as told by your health care provider. ? Put ice in a plastic bag. ? Place a towel between your skin and the bag. ? Leave the ice on for 20 minutes, 2-3 times a day. ? Remove the ice if your skin turns bright red. This is very important. If you cannot feel pain, heat, or cold, you have a greater risk of damage to the area.  If directed, apply heat to the affected area as often as told by your health care provider. Use the heat source that your health care provider recommends, such as a moist heat pack or a heating pad. ? Place a towel between your skin and the heat source. ? Leave the heat on for 20-30 minutes. ? Remove the heat if your skin turns bright red. This is especially important if you are unable to feel pain, heat, or cold. You may have a greater risk of getting burned.      General instructions  Rest as needed. Return to your normal activities as told by your health care provider. Ask your health care provider what activities are safe for you.  Do exercises as told by your health care provider or physical therapist.  Keep all follow-up visits. This is important. Contact a health care provider if:  Your pain is not controlled with medicine.  You have  a fever.  Your pain is getting worse. Get help right away if:  You have weakness, numbness, or tingling in your legs or feet.  You lose control of your bladder or bowels. Summary  Sacroiliac (SI) joint dysfunction is a condition that causes inflammation on one or both sides of the SI joint.  This condition causes deep aching or burning pain in the low back. In some cases, the pain may also spread into one or both buttocks, hips, or thighs.  Treatment depends on the cause and severity of your condition. It may include medicines to reduce pain and swelling or to relax muscles. This information is not intended to replace advice given to you by your health care provider. Make sure you discuss any questions you have with your health care provider. Document Revised: 04/09/2020 Document Reviewed: 04/09/2020 Elsevier Patient Education  [place].  Please be sure medication list is accurate. If a new problem  present, please set up appointment sooner than planned today.

## 2021-05-05 DIAGNOSIS — Z01419 Encounter for gynecological examination (general) (routine) without abnormal findings: Secondary | ICD-10-CM | POA: Diagnosis not present

## 2021-05-05 DIAGNOSIS — Z6832 Body mass index (BMI) 32.0-32.9, adult: Secondary | ICD-10-CM | POA: Diagnosis not present

## 2021-05-10 ENCOUNTER — Encounter: Payer: Self-pay | Admitting: Family Medicine

## 2021-05-21 ENCOUNTER — Telehealth: Payer: Self-pay | Admitting: Family Medicine

## 2021-05-21 DIAGNOSIS — M545 Low back pain, unspecified: Secondary | ICD-10-CM

## 2021-05-21 NOTE — Addendum Note (Signed)
Addended by: Nathanial Millman E on: 05/21/2021 10:08 AM   Modules accepted: Orders

## 2021-05-21 NOTE — Telephone Encounter (Signed)
Pt is calling in stating that she has completed the prednisone and is still in a lot of pain.  Pt would like to know what the next step will be she is out of town at this present time but would like to know so when she returns she will know what to expect.  Pt would like to rule out MRI due to the cost of the procedure and want to work on other alternatives first.

## 2021-05-21 NOTE — Telephone Encounter (Signed)
Per PCP's last note - if prednisone didn't help, next step would be referral to ortho.  I called and spoke with patient, she is okay with moving forward with the referral. Referral entered & patient is aware someone will contact her to schedule the appointment.

## 2021-05-26 ENCOUNTER — Ambulatory Visit: Payer: Self-pay

## 2021-05-26 ENCOUNTER — Ambulatory Visit: Payer: BC Managed Care – PPO | Admitting: Family Medicine

## 2021-05-26 ENCOUNTER — Encounter: Payer: Self-pay | Admitting: Family Medicine

## 2021-05-26 ENCOUNTER — Other Ambulatory Visit: Payer: Self-pay

## 2021-05-26 DIAGNOSIS — M5459 Other low back pain: Secondary | ICD-10-CM | POA: Diagnosis not present

## 2021-05-26 DIAGNOSIS — M545 Low back pain, unspecified: Secondary | ICD-10-CM

## 2021-05-26 NOTE — Progress Notes (Signed)
Office Visit Note   Patient: Danielle [name]           Date of Birth: 1981-07-04           MRN: [id number] Visit Date: 05/26/2021 Requested by: Martinique, Betty G, MD [address] [place],  [place] [address] PCP: Martinique, Betty G, MD  Subjective: Chief Complaint  Patient presents with   Lower Back - Pain    Pain in the left side of the lower back/buttock x 1 month. Started a couple days after planting a raised flower bed. Has seen her PCP twice now. Medrol dosepak did not help and then a longer taper of prednisone. The higher mg days helped the pain, but then the pain returned on the lower dose days. Pain with raising the leg. Cannot sleep comfortably. Hurts to bend over at the waist. No numbness/tingling in the leg/foot.    HPI: She is here with left-sided low back pain.  Symptoms started about a month ago, a couple days after planting a raised garden.  She was doing a lot of bending and twisting and did not feel much pain at the time, but after a few days her back was hurting quite a bit with occasional radiation into the back of the left leg.  She went to her PCP who gave her a Medrol Dosepak.  This did not help much.  She went back to her PCP and was given a longer dose of prednisone which helped during the higher dosages but then it quit working.  No bowel or bladder dysfunction, no numbness or weakness in her leg.  Pain is constant.  She had a similar episode last year which resolved with a prednisone Dosepak.  No family history of rheumatologic disease to her knowledge.              ROS:   All other systems were reviewed and are negative.  Objective: Vital Signs: There were no vitals taken for this visit.  Physical Exam:  General:  Alert and oriented, in no acute distress. Pulm:  Breathing unlabored. Psy:  Normal mood, congruent affect. Skin: No rash Low back: She is tender at the inferior aspect of the left SI joint.  Minimal pain in the left sciatic notch.  No pain  with straight leg raise, no pain with internal or external hip rotation.  Lower extremity strength and reflexes are normal.  Patrick's test is equivocal.    Imaging: US Guided Needle Placement  Result Date: 05/26/2021 Ultrasound guided injection is preferred based studies that show increased duration, increased effect, greater accuracy, decreased procedural pain, increased response rate, and decreased cost with ultrasound guided versus blind injection.   Verbal informed consent obtained.  Time-out conducted.  Noted no overlying erythema, induration, or other signs of local infection. Ultrasound-guided left SI injection: After sterile prep with Betadine, injected 4 cc 0.25% bupivocaine without epinephrine and 6 mg betamethasone using a 22-gauge spinal needle, passing the needle through the sacroiliac ligament into the region of the SI joint.  Very good immediate relief.    XR Lumbar Spine 2-3 Views  Result Date: 05/26/2021 X-rays reveal well-preserved hip joints, no significant sacral iliac degenerative change, mild to moderate facet arthropathy at several levels.  Disc spaces are well-preserved.  No sign of compression fracture or neoplasm.   Assessment & Plan: Left-sided low back pain, suspect sacroiliac dysfunction but cannot rule out lumbar disc protrusion. -Discussed options with her and elected to inject the SI joint under  ultrasound guidance.  If this only gives short-term relief, consider MRI scan or possibly physical therapy.     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/15/2016   Generalized anxiety disorder 04/15/2016   Palpitations 10/11/2012   Past Medical History:  Diagnosis Date   Asthma    Constipation    Generalized headaches    Other and  unspecified ovarian cysts    Pulse irregularity    Rapid heart rate    Seasonal allergies     Family History  Problem Relation Age of Onset   Hypertension Mother    Hypertension Father    Anxiety disorder Father    Anxiety disorder Brother    Depression Brother    ADD / ADHD Son    Tourette syndrome Son    Depression Daughter    CAD Neg Hx     Past Surgical History:  Procedure Laterality Date   DILATION AND CURETTAGE OF UTERUS  2005   Social History   Occupational History   Occupation: Therapist, 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

## 2021-05-30 ENCOUNTER — Encounter: Payer: Self-pay | Admitting: Family Medicine

## 2021-05-30 DIAGNOSIS — M545 Low back pain, unspecified: Secondary | ICD-10-CM

## 2021-05-31 NOTE — Addendum Note (Signed)
Addended by: Hortencia Pilar on: 05/31/2021 08:40 AM   Modules accepted: Orders

## 2021-06-03 MED ORDER — BACLOFEN 10 MG PO TABS: 5.0000 mg | ORAL_TABLET | Freq: Three times a day (TID) | ORAL | 3 refills | Status: DC | PRN

## 2021-06-03 NOTE — Addendum Note (Signed)
Addended by: Hortencia Pilar on: 06/03/2021 06:41 AM   Modules accepted: Orders

## 2021-06-04 ENCOUNTER — Other Ambulatory Visit: Payer: Self-pay

## 2021-06-04 ENCOUNTER — Ambulatory Visit: Payer: BC Managed Care – PPO | Admitting: Rehabilitative and Restorative Service Providers"

## 2021-06-04 ENCOUNTER — Encounter: Payer: Self-pay | Admitting: Rehabilitative and Restorative Service Providers"

## 2021-06-04 DIAGNOSIS — R293 Abnormal posture: Secondary | ICD-10-CM | POA: Diagnosis not present

## 2021-06-04 DIAGNOSIS — R262 Difficulty in walking, not elsewhere classified: Secondary | ICD-10-CM | POA: Diagnosis not present

## 2021-06-04 DIAGNOSIS — M545 Low back pain, unspecified: Secondary | ICD-10-CM

## 2021-06-04 NOTE — Therapy (Signed)
[facility] [address] [place], [place], [address] Phone: [phone]   Fax:  [phone]  Physical Therapy Evaluation  Patient Details  Name: Danielle [name] MRN: [id number] Date of Birth: 12/22/1980 Referring Provider (PT): Dr. Eunice Blase   Encounter Date: 06/04/2021   PT End of Session - 06/04/21 0931     Visit Number 1    Number of Visits 12    Date for PT Re-Evaluation 07/30/21    Progress Note Due on Visit 10    PT Start Time 0935    PT Stop Time 1015    PT Time Calculation (min) 40 min    Activity Tolerance Patient tolerated treatment well    Behavior During Therapy [facility] for tasks assessed/performed             Past Medical History:  Diagnosis Date   Asthma    Constipation    Generalized headaches    Other and unspecified ovarian cysts    Pulse irregularity    Rapid heart rate    Seasonal allergies     Past Surgical History:  Procedure Laterality Date   DILATION AND CURETTAGE OF UTERUS  2005    There were no vitals filed for this visit.    Subjective Assessment - 06/04/21 0938     Subjective Pt. indicated over a month ago she was working in yard and felt pain in [place] lower back that has progressively gotten worse.  Initially saw PCP with medicine provided with no improvements.  Pt. was referred for ortho MD and was given steriod pack for vacation trip (improvement noted initally but then pain returned).  Pt. saw Dr. Junius Roads with injection given into SI joint and Pt. indicated no improvement and feeling high severity of symptoms.  Pt. stated pain c cough, sneezing and pressure.  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  Pt. stated pain started in back and now feels pain into buttock.   Pt. denied numbness/tingling into Lt leg at this time, now bowel or bladder changes.    Limitations House hold activities;Sitting;Lifting;Standing;Walking    Patient Stated Goals Reduce  pain    Currently in Pain? Yes    Pain Score 6    at worst 7/10   Pain Location Back    Pain Orientation Left    Pain Descriptors / Indicators Constant;Burning;Sharp;Shooting    Pain Type Chronic pain   acute transitioning to chronic   Pain Radiating Towards Lt hip/buttock    Pain Onset More than a month ago    Pain Frequency Constant    Aggravating Factors  sitting prolonged, bending, lifting, moving Lt leg, worse in morning    Pain Relieving Factors Heat, medicine at times    Effect of Pain on Daily Activities Limited in daily and work activity due to pain symptoms.                [facility] PT Assessment - 06/04/21 0001       Assessment   Medical Diagnosis Low Back pain    Referring Provider (PT) Dr. Legrand Como Hilts    Onset Date/Surgical Date 04/11/21   over a month ago   Hand Dominance Right      Precautions   Precautions None      Restrictions   Weight Bearing Restrictions No      Balance Screen   Has the patient fallen in the past 6 months No    Has the  patient had a decrease in activity level because of a fear of falling?  No    Is the patient reluctant to leave their home because of a fear of falling?  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    Leisure Traveling to beach, kid care activity, reading, photography, shopping, gardening      Observation/Other Assessments   Focus on Therapeutic Outcomes (FOTO)  Intake 40%, predicted 60%      Sensation   Light Touch Appears Intact      Posture/Postural Control   Posture/Postural Control Postural limitations    Postural Limitations Decreased lumbar lordosis      ROM / Strength   AROM / PROM / Strength Strength;PROM;AROM      AROM   AROM Assessment Site Lumbar;Hip    Lumbar Flexion To mid shin c pain Lt lumbar/buttock   aberrant movement  painful arc   Lumbar Extension 50% ERP lumbar, Lt posterior hip    Lumbar - Right Side Bend to femoral epicondyle    Lumbar - Left Side Bend to femoral epicondyle      PROM   PROM Assessment Site Hip    Right/Left Hip Right;Left      Strength   Strength Assessment Site Hip;Knee;Ankle    Right/Left Hip Left;Right    Right Hip Flexion 5/5    Left Hip Flexion 5/5    Right/Left Knee Left;Right    Right Knee Flexion 5/5    Right Knee Extension 5/5    Left Knee Flexion 5/5    Left Knee Extension 5/5    Right/Left Ankle Left;Right    Right Ankle Dorsiflexion 5/5    Left Ankle Dorsiflexion 5/5      Palpation   Spinal mobility Normal PAIVM assessment in lumbar region with no concordant pain indicated.      Special Tests   Other special tests (+)slump on Lt for back/buttock symptoms on Lt, ( - )crossed slr bilateral                        Objective measurements completed on examination: See above findings.       [facility] Adult PT Treatment/Exercise - 06/04/21 0001       Exercises   Exercises Other Exercises;Lumbar    Other Exercises  HEP instruction/performance c cues for techniques, handout provided.  Trial set performed of each for comprehension and symptom assessment.  HEP consisting of supine sciatic nerve flossing, prone press up, supine piriformis stretching per handout                    PT Education - 06/04/21 0931     Education Details HEP, POC    Person(s) Educated Patient    Methods Explanation;Demonstration;Verbal cues;Handout    Comprehension Verbalized understanding;Returned demonstration              PT Short Term Goals - 06/04/21 0931       PT SHORT TERM GOAL #1   Title Patient will demonstrate independent use of home exercise program to maintain progress from in clinic treatments.    Time 3    Period Weeks    Status New    Target Date 06/25/21  PT Long Term Goals - 06/04/21 0931       PT LONG TERM  GOAL #1   Title Patient will demonstrate/report pain at worst less than or equal to 2/10 to facilitate minimal limitation in daily activity secondary to pain symptoms.    Time 8    Period Weeks    Status New    Target Date 07/30/21      PT LONG TERM GOAL #2   Title Patient will demonstrate independent use of home exercise program to facilitate ability to maintain/progress functional gains from skilled physical therapy services.    Time 8    Period Weeks    Status New    Target Date 07/30/21      PT LONG TERM GOAL #3   Title Pt. will demonstrate FOTO outcome > or = 60 %to indicated reduced disability due to condtion.    Time 8    Period Weeks    Status New    Target Date 07/30/21      PT LONG TERM GOAL #4   Title Patient will demonstrate lumbar extension 100 % WFL s symptoms to facilitate upright standing, walking posture at PLOF s limitation.    Time 8    Period Weeks    Status New    Target Date 07/30/21      PT LONG TERM GOAL #5   Title Pt. will demonstrate/report ability to perform usual work at [employer].    Time 8    Period Weeks    Status New    Target Date 07/30/21                    Plan - 06/04/21 0932     Clinical Impression Statement Patient is a 40 y.o. who comes to clinic with complaints of low back pain with mobility, strength and movement coordination deficits that impair their ability to perform usual daily and recreational functional activities without increase difficulty/symptoms at this time.  Patient to benefit from skilled PT services to address impairments and limitations to improve to previous level of function without restriction secondary to condition.    Examination-Activity Limitations Sit;Sleep;Bed Mobility;Bend;Squat;Stairs;Carry;Stand;Transfers;Dressing;Hygiene/Grooming;Lift;Locomotion Level;Reach Overhead    Examination-Participation Restrictions Cleaning;Shop;Community Activity;Yard Work;Laundry;Meal Prep;Occupation    Stability/Clinical  Decision Making Stable/Uncomplicated    Clinical Decision Making Low    Rehab Potential Good    PT Frequency --   1-2x/week   PT Duration 8 weeks    PT Treatment/Interventions ADLs/Self Care Home Management;Electrical Stimulation;Cryotherapy;Moist Heat;Iontophoresis 4mg /ml Dexamethasone;Traction;Balance training;Therapeutic exercise;Therapeutic activities;Functional mobility training;Stair training;Gait training;DME Instruction;Ultrasound;Neuromuscular re-education;Patient/family education;Passive range of motion;Spinal Manipulations;Joint Manipulations;Dry needling;Taping;Manual techniques    PT Next Visit Plan Possible dry needling to Lt glute max, piriformis, Lt QL if desired.  Reassess HEP use.    PT Home Exercise Plan [email or web address]    Consulted and Agree with Plan of Care Patient             Patient will benefit from skilled therapeutic intervention in order to improve the following deficits and impairments:  Pain, Increased fascial restricitons, Decreased activity tolerance, Decreased mobility, Difficulty walking, Increased muscle spasms, Improper body mechanics, Impaired perceived functional ability, Impaired flexibility, Postural dysfunction, Decreased range of motion, Decreased coordination  Visit Diagnosis: Acute left-sided low back pain without sciatica  Abnormal posture  Difficulty in walking, not elsewhere classified     Problem List Patient Active Problem List   Diagnosis Date Noted   Panic 11/22/2018   Mood disorder ([place]) 11/22/2018   Asthma with exacerbation  05/13/2016   Allergic rhinitis 05/13/2016   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

## 2021-06-04 NOTE — Patient Instructions (Signed)
Access Code: [email or web address] URL: [email or web address] Date: 06/04/2021 Prepared by: Scot Jun  Exercises Prone Press Up - 2 x daily - 7 x weekly - 3 sets - 10 reps - 1-2 hold Supine Piriformis Stretch Pulling Heel to Hip - 2 x daily - 7 x weekly - 1 sets - 5 reps - 30 hold Supine 90/90 Sciatic Nerve Glide with Knee Flexion/Extension (Mirrored) - 2 x daily - 7 x weekly - 3 sets - 10 reps  Patient Education Trigger Point Dry Needling

## 2021-06-08 ENCOUNTER — Other Ambulatory Visit: Payer: Self-pay | Admitting: Family Medicine

## 2021-06-08 ENCOUNTER — Encounter: Payer: Self-pay | Admitting: Rehabilitative and Restorative Service Providers"

## 2021-06-08 ENCOUNTER — Ambulatory Visit (INDEPENDENT_AMBULATORY_CARE_PROVIDER_SITE_OTHER): Payer: BC Managed Care – PPO | Admitting: Rehabilitative and Restorative Service Providers"

## 2021-06-08 ENCOUNTER — Other Ambulatory Visit: Payer: Self-pay

## 2021-06-08 DIAGNOSIS — R262 Difficulty in walking, not elsewhere classified: Secondary | ICD-10-CM | POA: Diagnosis not present

## 2021-06-08 DIAGNOSIS — R1310 Dysphagia, unspecified: Secondary | ICD-10-CM

## 2021-06-08 DIAGNOSIS — M545 Low back pain, unspecified: Secondary | ICD-10-CM | POA: Diagnosis not present

## 2021-06-08 DIAGNOSIS — R293 Abnormal posture: Secondary | ICD-10-CM

## 2021-06-08 NOTE — Patient Instructions (Signed)
Access Code: [email or web address] URL: [email or web address] Date: 06/08/2021 Prepared by: Scot Jun  Exercises Prone Press Up - 2 x daily - 7 x weekly - 3 sets - 10 reps - 1-2 hold Supine Piriformis Stretch Pulling Heel to Hip - 2 x daily - 7 x weekly - 1 sets - 5 reps - 30 hold Supine 90/90 Sciatic Nerve Glide with Knee Flexion/Extension (Mirrored) - 2 x daily - 7 x weekly - 3 sets - 10 reps Supine Lower Trunk Rotation - 2 x daily - 7 x weekly - 5 reps - 1 sets - 15 hold Supine Single Knee to Chest Stretch (Mirrored) - 2 x daily - 7 x weekly - 1 sets - 5 reps - 30 hold

## 2021-06-08 NOTE — Therapy (Signed)
[facility] [address] [place], [place], [address] Phone: [phone]   Fax:  [phone]  Physical Therapy Treatment  Patient Details  Name: Danielle [name] MRN: [id number] Date of Birth: 10-Apr-1981 Referring Provider (PT): Dr. Eunice Blase   Encounter Date: 06/08/2021   PT End of Session - 06/08/21 0800     Visit Number 2    Number of Visits 12    Date for PT Re-Evaluation 07/30/21    Progress Note Due on Visit 10    PT Start Time 0800    PT Stop Time 0839    PT Time Calculation (min) 39 min    Activity Tolerance Patient tolerated treatment well    Behavior During Therapy [facility] for tasks assessed/performed             Past Medical History:  Diagnosis Date   Asthma    Constipation    Generalized headaches    Other and unspecified ovarian cysts    Pulse irregularity    Rapid heart rate    Seasonal allergies     Past Surgical History:  Procedure Laterality Date   DILATION AND CURETTAGE OF UTERUS  2005    There were no vitals filed for this visit.   Subjective Assessment - 06/08/21 0801     Subjective Pt. indicated morning continued to be troublesome and had some trouble doing some exerciss due to having pain already.    Limitations House hold activities;Sitting;Lifting;Standing;Walking    Patient Stated Goals Reduce pain    Currently in Pain? Yes    Pain Score 7     Pain Location Back    Pain Orientation Left    Pain Descriptors / Indicators Constant;Burning;Shooting;Sharp    Pain Type Chronic pain    Pain Onset More than a month ago    Pain Frequency Constant    Aggravating Factors  morning time pain, stiffness c inactivity    Pain Relieving Factors heat                               [facility] Adult PT Treatment/Exercise - 06/08/21 0001       Lumbar Exercises: Stretches   Single Knee to Chest Stretch 30 seconds;5 reps;Left    Lower Trunk Rotation 5 reps   15 seconds bilateral    Piriformis Stretch 30 seconds;5 reps;Left      Lumbar Exercises: Aerobic   Recumbent Bike Lvl 2 6 mins      Manual Therapy   Manual therapy comments compression to Lt QL, glute max, palpation during dry needling              Trigger Point Dry Needling - 06/08/21 0001     Consent Given? Yes    Education Handout Provided Previously provided    Muscles Treated Back/Hip Quadratus lumborum;Gluteus maximus   Lt   Gluteus Maximus Response Twitch response elicited    Quadratus Lumborum Response Twitch response elicited                  PT Education - 06/08/21 0832     Education Details HEP progression , DN review    Person(s) Educated Patient    Methods Demonstration;Explanation;Verbal cues;Handout    Comprehension Verbalized understanding;Returned demonstration              PT Short Term Goals - 06/08/21 0829       PT SHORT TERM GOAL #1  Title Patient will demonstrate independent use of home exercise program to maintain progress from in clinic treatments.    Time 3    Period Weeks    Status On-going    Target Date 06/25/21               PT Long Term Goals - 06/04/21 0931       PT LONG TERM GOAL #1   Title Patient will demonstrate/report pain at worst less than or equal to 2/10 to facilitate minimal limitation in daily activity secondary to pain symptoms.    Time 8    Period Weeks    Status New    Target Date 07/30/21      PT LONG TERM GOAL #2   Title Patient will demonstrate independent use of home exercise program to facilitate ability to maintain/progress functional gains from skilled physical therapy services.    Time 8    Period Weeks    Status New    Target Date 07/30/21      PT LONG TERM GOAL #3   Title Pt. will demonstrate FOTO outcome > or = 60 %to indicated reduced disability due to condtion.    Time 8    Period Weeks    Status New    Target Date 07/30/21      PT LONG TERM GOAL #4   Title Patient will demonstrate lumbar  extension 100 % WFL s symptoms to facilitate upright standing, walking posture at PLOF s limitation.    Time 8    Period Weeks    Status New    Target Date 07/30/21      PT LONG TERM GOAL #5   Title Pt. will demonstrate/report ability to perform usual work at [employer].    Time 8    Period Weeks    Status New    Target Date 07/30/21                   Plan - 06/08/21 0827     Clinical Impression Statement Strong contractions c reproduced concordant symptoms noted in back from Lt QL and in Lt posterior hip from Lt glute max.  Pt. tolerated manual and dry needling good overall.  Reviewed use of HEP for progression at home to promote improved mobility.    Examination-Activity Limitations Sit;Sleep;Bed Mobility;Bend;Squat;Stairs;Carry;Stand;Transfers;Dressing;Hygiene/Grooming;Lift;Locomotion Level;Reach Overhead    Examination-Participation Restrictions Cleaning;Shop;Community Activity;Yard Work;Laundry;Meal Prep;Occupation    Stability/Clinical Decision Making Stable/Uncomplicated    Rehab Potential Good    PT Frequency --   1-2x/week   PT Duration 8 weeks    PT Treatment/Interventions ADLs/Self Care Home Management;Electrical Stimulation;Cryotherapy;Moist Heat;Iontophoresis 4mg /ml Dexamethasone;Traction;Balance training;Therapeutic exercise;Therapeutic activities;Functional mobility training;Stair training;Gait training;DME Instruction;Ultrasound;Neuromuscular re-education;Patient/family education;Passive range of motion;Spinal Manipulations;Joint Manipulations;Dry needling;Taping;Manual techniques    PT Next Visit Plan Dry needling again if desired/helpful    PT Home Exercise Plan [email or web address]    Consulted and Agree with Plan of Care Patient             Patient will benefit from skilled therapeutic intervention in order to improve the following deficits and impairments:  Pain, Increased fascial restricitons, Decreased activity tolerance, Decreased mobility, Difficulty walking,  Increased muscle spasms, Improper body mechanics, Impaired perceived functional ability, Impaired flexibility, Postural dysfunction, Decreased range of motion, Decreased coordination  Visit Diagnosis: Acute left-sided low back pain without sciatica  Abnormal posture  Difficulty in walking, not elsewhere classified     Problem List Patient Active Problem List   Diagnosis Date Noted   Panic 11/22/2018  Mood disorder ([place]) 11/22/2018   Asthma with exacerbation 05/13/2016   Allergic rhinitis 05/13/2016   Agoraphobia with panic attacks 04/15/2016   Generalized anxiety disorder 04/15/2016   Palpitations 10/11/2012   Scot Jun, PT, DPT, OCS, ATC 06/08/21  9:22 AM    [facility] [address] [place], [place], [address] Phone: [phone]   Fax:  [phone]  Name: Deseri Loss MRN: [id number] Date of Birth: 1981/07/03

## 2021-06-15 MED ORDER — DIAZEPAM 5 MG PO TABS: ORAL_TABLET | ORAL | 0 refills | Status: DC

## 2021-06-15 NOTE — Addendum Note (Signed)
Addended by: Hortencia Pilar on: 06/15/2021 02:42 PM   Modules accepted: Orders

## 2021-06-21 ENCOUNTER — Other Ambulatory Visit: Payer: Self-pay

## 2021-06-21 ENCOUNTER — Encounter: Payer: BC Managed Care – PPO | Admitting: Physical Therapy

## 2021-06-21 ENCOUNTER — Ambulatory Visit: Payer: BC Managed Care – PPO | Admitting: Psychiatry

## 2021-06-21 DIAGNOSIS — F411 Generalized anxiety disorder: Secondary | ICD-10-CM | POA: Diagnosis not present

## 2021-06-21 NOTE — Progress Notes (Signed)
[facility] Counselor/Therapist Progress Note  Patient ID: Danielle [name], MRN: [id number],    Date: 06/21/2021  Time Spent: 65 minutes start time 8:05 AM end time 9:10  Treatment Type: Individual Therapy  Reported Symptoms: anxiety, back pain, heart palpitations, rumination, sadness  Mental Status Exam:  Appearance:   Well Groomed     Behavior:  Appropriate  Motor:  Normal  Speech/Language:   Normal Rate  Affect:  Appropriate  Mood:  anxious  Thought process:  normal  Thought content:    WNL  Sensory/Perceptual disturbances:    WNL  Orientation:  oriented to person, place, time/date, and situation  Attention:  Good  Concentration:  Good  Memory:  WNL  Fund of knowledge:   Good  Insight:    Good  Judgment:   Good  Impulse Control:  Good   Risk Assessment: Danger to Self:  No Self-injurious Behavior: No Danger to Others: No Duty to Warn:no Physical Aggression / Violence:No  Access to Firearms a concern: No  Gang Involvement:No   Subjective: Patient was present for session.  She reported she had tried a few sessions of acupuncture due to having back pain and getting ready for an MRI after having other treatments. She shared things are about the same with her family because she has pulled back and has less contact with them. She did acknowledge she feels sadness over the relationship with her mom because she doesn't know about her life and she doesn't know about hers. She also has awkwardness with her dad as well.Her brother still gets lots of attention and they do a lot for them so that is hard for patient. She reported she just feels bad after having time with them.  Developed a treatment plan and goals in session did not sign due to [place].  Discussed different grounding techniques.  Patient shared the ones that she was already utilizing and she was encouraged to practice counting backwards from 100 by twos them by threes.  Patient agreed to start EMDR in session.   Picture dad stating "you are back hurts", suds level 6, negative cognition "unforgettable" felt hurt and sadness in her shoulders and back.  Patient was able to recognize that that statement was tied to a lot of hurt from childhood from her parents patient explained she has never felt very important and that she always had to do everything she could to get their attention.  Patient shared that she had a close relationship with her grandmother but she did not with her mother.  Patient's suds level increased in session.  Build a container to deal with the emotions that were surfacing.  Encouraged her to practice putting her emotions in anything that triggers between sessions and her container.  Agreed to see patient again in a few days to try and finish the set.  Interventions: Solution-Oriented/Positive Psychology and Eye Movement Desensitization and Reprocessing (EMDR)  Diagnosis:   ICD-10-CM   1. Generalized anxiety disorder  F41.1       Plan: Patient is to use coping skills to manage emotions that may surface.  Patient is to journal as needed.  Patient is to take medication as directed.  Patient is to return on Thursday, 06/24/2021 to finish EMDR set that was started in session. Long-term goal: Resolve the core conflict that is the source of anxiety-decrease rumination by 50% Short-term goal: Identify the major life complex in the past and present that form the basis for present anxiety  Danielle [name], [facility]

## 2021-06-22 ENCOUNTER — Ambulatory Visit
Admission: RE | Admit: 2021-06-22 | Discharge: 2021-06-22 | Disposition: A | Discharge status: Home / Self Care | Payer: BC Managed Care – PPO | Source: Ambulatory Visit | Attending: Family Medicine | Admitting: Family Medicine

## 2021-06-22 DIAGNOSIS — M545 Low back pain, unspecified: Secondary | ICD-10-CM

## 2021-06-24 ENCOUNTER — Other Ambulatory Visit: Payer: Self-pay

## 2021-06-24 ENCOUNTER — Ambulatory Visit: Payer: BC Managed Care – PPO | Admitting: Psychiatry

## 2021-06-24 DIAGNOSIS — F411 Generalized anxiety disorder: Secondary | ICD-10-CM | POA: Diagnosis not present

## 2021-06-24 NOTE — Progress Notes (Signed)
      [facility] Counselor/Therapist Progress Note  Patient ID: Danielle [name], MRN: [id number],    Date: 06/24/2021  Time Spent: 62 minutes start time 12:04 PM end time 1:06 PM  Treatment Type: Individual Therapy  Reported Symptoms: chronic pain, anxiety, panic, sadness, triggered responses, rumination  Mental Status Exam:  Appearance:   Well Groomed     Behavior:  Appropriate  Motor:  Normal  Speech/Language:   Normal Rate  Affect:  Appropriate  Mood:  anxious  Thought process:  normal  Thought content:    WNL  Sensory/Perceptual disturbances:    WNL  Orientation:  oriented to person, place, time/date, and situation  Attention:  Good  Concentration:  Good  Memory:  WNL  Fund of knowledge:   Good  Insight:    Good  Judgment:   Good  Impulse Control:  Good   Risk Assessment: Danger to Self:  No Self-injurious Behavior: No Danger to Others: No Duty to Warn:no Physical Aggression / Violence:No  Access to Firearms a concern: No  Gang Involvement:No   Subjective: Patient was present for session.  She shared she did have to use the visualization from last and that it helped her get through the triggered moments.  She had panic when she had her MRI but she did get through it.  She shared she is realizing that she has never been close to her mother and the times she felt that were probably her trying to make something happen.  She has been able to think of positive times with her dad and that has been good even though that they are not close now.  Patient did EMDR set not being there, suds level 10, negative cognition "I do not matter" felt anger in her chest.  Patient was only able to reduce suds level to 7.  She struggled with trying to forgive her mom and have some empathy.  She was unable to get past black and white thinking.  Discussed the importance of being able to see different perspectives to be able to move forward.  Patient shared she was unable to do it in session.   Discussed how processing will continue and the importance of using her visual from last session to manage any emotions that surface appropriately.  Encouraged patient to contact clinician if she has any difficulties between sessions.  Interventions: Solution-Oriented/Positive Psychology, Eye Movement Desensitization and Reprocessing (EMDR), and Insight-Oriented  Diagnosis:   ICD-10-CM   1. Generalized anxiety disorder  F41.1       Plan: Patient is to use coping skills to decrease anxiety symptoms.  Patient is to practice visual from last session if negative emotions surfaced.  Patient is to journal any negative feelings if possible.  Patient is to contact clinician if emotions become more intense. Long-term goal: Resolve the core conflict that is the source of anxiety-decrease rumination by 50% Short-term goal: Identify the major life complex from the past and present that form the basis for present anxiety  Lina Sayre, [facility]

## 2021-06-25 ENCOUNTER — Telehealth: Payer: Self-pay | Admitting: Family Medicine

## 2021-06-25 ENCOUNTER — Ambulatory Visit (INDEPENDENT_AMBULATORY_CARE_PROVIDER_SITE_OTHER): Payer: BC Managed Care – PPO | Admitting: Rehabilitative and Restorative Service Providers"

## 2021-06-25 ENCOUNTER — Encounter: Payer: Self-pay | Admitting: Rehabilitative and Restorative Service Providers"

## 2021-06-25 DIAGNOSIS — R262 Difficulty in walking, not elsewhere classified: Secondary | ICD-10-CM

## 2021-06-25 DIAGNOSIS — R293 Abnormal posture: Secondary | ICD-10-CM | POA: Diagnosis not present

## 2021-06-25 DIAGNOSIS — M545 Low back pain, unspecified: Secondary | ICD-10-CM | POA: Diagnosis not present

## 2021-06-25 MED ORDER — PREDNISONE 10 MG PO TABS: ORAL_TABLET | ORAL | 0 refills | Status: DC

## 2021-06-25 NOTE — Addendum Note (Signed)
Addended by: Hortencia Pilar on: 06/25/2021 09:23 AM   Modules accepted: Orders

## 2021-06-25 NOTE — Patient Instructions (Signed)
Access Code: [email or web address] URL: [email or web address] Date: 06/25/2021 Prepared by: Scot Jun  Exercises Prone Press Up - 2 x daily - 7 x weekly - 3 sets - 10 reps - 1-2 hold Supine Piriformis Stretch Pulling Heel to Hip - 2 x daily - 7 x weekly - 1 sets - 5 reps - 30 hold Supine 90/90 Sciatic Nerve Glide with Knee Flexion/Extension (Mirrored) - 2 x daily - 7 x weekly - 3 sets - 10 reps Supine Lower Trunk Rotation - 2 x daily - 7 x weekly - 5 reps - 1 sets - 15 hold Supine Single Knee to Chest Stretch (Mirrored) - 2 x daily - 7 x weekly - 1 sets - 5 reps - 30 hold Right Standing Lateral Shift Correction at Wall - Repetitions - 2 x daily - 7 x weekly - 1-2 sets - 10 reps - 5 hold

## 2021-06-25 NOTE — Therapy (Signed)
[facility] Therapy [address] [place], [place], [address] Phone: [phone]   Fax:  [phone]  Physical Therapy Treatment  Patient Details  Name: Danielle [name] MRN: [id number] Date of Birth: [birth year] Referring Provider (PT): Dr. Eunice Blase   Encounter Date: 06/25/2021   PT End of Session - 06/25/21 1026     Visit Number 3    Number of Visits 12    Date for PT Re-Evaluation 07/30/21    Progress Note Due on Visit 10    PT Start Time 0930    PT Stop Time 1013    PT Time Calculation (min) 43 min    Activity Tolerance Patient tolerated treatment well    Behavior During Therapy [facility] for tasks assessed/performed             Past Medical History:  Diagnosis Date   Asthma    Constipation    Generalized headaches    Other and unspecified ovarian cysts    Pulse irregularity    Rapid heart rate    Seasonal allergies     Past Surgical History:  Procedure Laterality Date   DILATION AND CURETTAGE OF UTERUS  2005    There were no vitals filed for this visit.   Subjective Assessment - 06/25/21 0940     Subjective Pt. indicated soreness for a couple days and reported no specific improvement in symptoms.  Pt. indicated symptoms have occured down into lower leg showing up now.    Limitations House hold activities;Sitting;Lifting;Standing;Walking    Patient Stated Goals Reduce pain    Currently in Pain? Yes    Pain Score 6     Pain Location Back    Pain Orientation Left    Pain Descriptors / Indicators Constant;Burning;Shooting;Sharp    Pain Type Chronic pain    Pain Radiating Towards Lt leg to calf    Pain Onset More than a month ago    Pain Frequency Constant    Aggravating Factors  insidious pains during day, stairs, twisting, cough    Pain Relieving Factors heat                [facility] PT Assessment - 06/25/21 0001       Assessment   Medical Diagnosis Low Back pain    Referring Provider (PT) Dr. Legrand Como Hilts     Onset Date/Surgical Date 04/11/21    Hand Dominance Right      AROM   Lumbar Flexion movement to toes, no complaints    Lumbar Extension 50% ERP lumbar, Lt posterior hip/thigh      Palpation   Spinal mobility Lt calf complaints noted c L4, L5 cPA assessment today      Special Tests   Other special tests Continued +Slump on Lt, negative crossed SLR.  Passive SLR to 90 degrees bilateral                           [facility] Adult PT Treatment/Exercise - 06/25/21 0001       Exercises   Other Exercises  Additional time spent in HEP review, cues for use and routine performance.      Lumbar Exercises: Stretches   Piriformis Stretch 30 seconds;3 reps;Left   cues for performance     Manual Therapy   Manual therapy comments manual sciatic nerve flossing in supine to Lt leg (knee extension c PF, flexion c DF).  Prone cPA L2-L5 g3  PT Short Term Goals - 06/25/21 1016       PT SHORT TERM GOAL #1   Title Patient will demonstrate independent use of home exercise program to maintain progress from in clinic treatments.    Time 3    Period Weeks    Status Partially Met    Target Date 06/25/21               PT Long Term Goals - 06/25/21 1026       PT LONG TERM GOAL #1   Title Patient will demonstrate/report pain at worst less than or equal to 2/10 to facilitate minimal limitation in daily activity secondary to pain symptoms.    Time 8    Period Weeks    Status On-going    Target Date 07/30/21      PT LONG TERM GOAL #2   Title Patient will demonstrate independent use of home exercise program to facilitate ability to maintain/progress functional gains from skilled physical therapy services.    Time 8    Period Weeks    Status On-going    Target Date 07/30/21      PT LONG TERM GOAL #3   Title Pt. will demonstrate FOTO outcome > or = 60 %to indicated reduced disability due to condtion.    Time 8    Period Weeks    Status On-going     Target Date 07/30/21      PT LONG TERM GOAL #4   Title Patient will demonstrate lumbar extension 100 % WFL s symptoms to facilitate upright standing, walking posture at PLOF s limitation.    Time 8    Period Weeks    Status On-going    Target Date 07/30/21      PT LONG TERM GOAL #5   Title Pt. will demonstrate/report ability to perform usual work at [employer].    Time 8    Period Weeks    Status On-going    Target Date 07/30/21                   Plan - 06/25/21 1011     Clinical Impression Statement Continued presentation of low back and Lt radicular symptoms most noted at this time c no change.  MRI was performed showing pressure on nerve root with connection to symptoms possible.  Pt. starting new medication routine and may end up with injection if indicated.  Current presentation indicated for continued attempts at centralization c extension at this time.  Pt. did present c some mild improvement in movement of lumbar at this time and reduced myofascial connected complaints.    Examination-Activity Limitations Sit;Sleep;Bed Mobility;Bend;Squat;Stairs;Carry;Stand;Transfers;Dressing;Hygiene/Grooming;Lift;Locomotion Level;Reach Overhead    Examination-Participation Restrictions Cleaning;Shop;Community Activity;Yard Work;Laundry;Meal Prep;Occupation    Stability/Clinical Decision Making Stable/Uncomplicated    Rehab Potential Good    PT Frequency --   1-2x/week   PT Duration 8 weeks    PT Treatment/Interventions ADLs/Self Care Home Management;Electrical Stimulation;Cryotherapy;Moist Heat;Iontophoresis 14m/ml Dexamethasone;Traction;Balance training;Therapeutic exercise;Therapeutic activities;Functional mobility training;Stair training;Gait training;DME Instruction;Ultrasound;Neuromuscular re-education;Patient/family education;Passive range of motion;Spinal Manipulations;Joint Manipulations;Dry needling;Taping;Manual techniques    PT Next Visit Plan Review existing HEP usage, continue  to try to centralize symptoms c extension based movement/lateral shift corrections    PT Home Exercise Plan [email or web address]    Consulted and Agree with Plan of Care Patient             Patient will benefit from skilled therapeutic intervention in order to improve the following deficits and impairments:  Pain, Increased  fascial restricitons, Decreased activity tolerance, Decreased mobility, Difficulty walking, Increased muscle spasms, Improper body mechanics, Impaired perceived functional ability, Impaired flexibility, Postural dysfunction, Decreased range of motion, Decreased coordination  Visit Diagnosis: Acute left-sided low back pain without sciatica  Abnormal posture  Difficulty in walking, not elsewhere classified     Problem List Patient Active Problem List   Diagnosis Date Noted   Panic 11/22/2018   Mood disorder ([place]) 11/22/2018   Asthma with exacerbation 05/13/2016   Allergic rhinitis 05/13/2016   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 [place], [place], [address] Phone: [phone]   Fax:  [phone]  Name: Danielle [name] MRN: [id number] Date of Birth: 05/30/1981

## 2021-06-25 NOTE — Telephone Encounter (Signed)
Lumbar MRI scan shows a small left-sided disc protrusion at L5-S1 which could potentially contact the left S1 nerve.  There is a right-sided disc protrusion at L4-5 which is close to the right L4 nerve.  Based on pain pattern, the L5-S1 disc is the most likely source of pain.  Treatment options include continuing with physical therapy, or possibly referring to Dr. Ernestina Patches for epidural steroid injection.

## 2021-06-28 ENCOUNTER — Other Ambulatory Visit: Payer: Self-pay

## 2021-06-28 ENCOUNTER — Ambulatory Visit (INDEPENDENT_AMBULATORY_CARE_PROVIDER_SITE_OTHER): Payer: BC Managed Care – PPO | Admitting: Rehabilitative and Restorative Service Providers"

## 2021-06-28 DIAGNOSIS — R293 Abnormal posture: Secondary | ICD-10-CM | POA: Diagnosis not present

## 2021-06-28 DIAGNOSIS — R262 Difficulty in walking, not elsewhere classified: Secondary | ICD-10-CM | POA: Diagnosis not present

## 2021-06-28 DIAGNOSIS — M545 Low back pain, unspecified: Secondary | ICD-10-CM | POA: Diagnosis not present

## 2021-06-28 NOTE — Therapy (Signed)
[facility] [address] [place], [place], [address] Phone: [phone]   Fax:  [phone]  Physical Therapy Treatment  Patient Details  Name: Danielle [name] MRN: [id number] Date of Birth: [birth year] Referring Provider (PT): Dr. Eunice Blase   Encounter Date: 06/28/2021   PT End of Session - 06/28/21 1022     Visit Number 4    Number of Visits 12    Date for PT Re-Evaluation 07/30/21    Progress Note Due on Visit 10    PT Start Time 1016    PT Stop Time 1055    PT Time Calculation (min) 39 min    Activity Tolerance Patient tolerated treatment well    Behavior During Therapy [facility] for tasks assessed/performed             Past Medical History:  Diagnosis Date   Asthma    Constipation    Generalized headaches    Other and unspecified ovarian cysts    Pulse irregularity    Rapid heart rate    Seasonal allergies     Past Surgical History:  Procedure Laterality Date   DILATION AND CURETTAGE OF UTERUS  2005    There were no vitals filed for this visit.   Subjective Assessment - 06/28/21 1021     Subjective Pt. indicated medicine starting this weekend.  Pt. indicated feeling some lessening of symptoms overall but still noted.  Pt. also indicated trying patches on Lt lower back/hip wth some improvement.    Limitations House hold activities;Sitting;Lifting;Standing;Walking    Patient Stated Goals Reduce pain    Currently in Pain? Yes    Pain Score 4     Pain Location Back    Pain Orientation Left    Pain Descriptors / Indicators Burning;Constant;Shooting;Sharp    Pain Type Chronic pain    Pain Onset More than a month ago    Pain Frequency Constant    Pain Relieving Factors medicine, patches                               [facility] Adult PT Treatment/Exercise - 06/28/21 0001       Lumbar Exercises: Stretches   Piriformis Stretch 30 seconds;5 reps;Left    Gastroc Stretch 30 seconds;3 reps;Left   runner  stretch on incline board   Other Lumbar Stretch Exercise Lt knee to opposite shoulder 30 sec x 5    Other Lumbar Stretch Exercise supine sciatic nerve flossing 2 x 10 on Lt (knee ext c ankle PF, knee flexion c ankle DF)      Lumbar Exercises: Supine   Bridge 15 reps;3 seconds      Lumbar Exercises: Sidelying   Clam Left   2 x 10, x 15     Manual Therapy   Manual therapy comments Lt leg long axis distraction, compression c movement to Lt glute med.  Percussive device STM to Lt glute med/max                      PT Short Term Goals - 06/25/21 1016       PT SHORT TERM GOAL #1   Title Patient will demonstrate independent use of home exercise program to maintain progress from in clinic treatments.    Time 3    Period Weeks    Status Partially Met    Target Date 06/25/21  PT Long Term Goals - 06/25/21 1026       PT LONG TERM GOAL #1   Title Patient will demonstrate/report pain at worst less than or equal to 2/10 to facilitate minimal limitation in daily activity secondary to pain symptoms.    Time 8    Period Weeks    Status On-going    Target Date 07/30/21      PT LONG TERM GOAL #2   Title Patient will demonstrate independent use of home exercise program to facilitate ability to maintain/progress functional gains from skilled physical therapy services.    Time 8    Period Weeks    Status On-going    Target Date 07/30/21      PT LONG TERM GOAL #3   Title Pt. will demonstrate FOTO outcome > or = 60 %to indicated reduced disability due to condtion.    Time 8    Period Weeks    Status On-going    Target Date 07/30/21      PT LONG TERM GOAL #4   Title Patient will demonstrate lumbar extension 100 % WFL s symptoms to facilitate upright standing, walking posture at PLOF s limitation.    Time 8    Period Weeks    Status On-going    Target Date 07/30/21      PT LONG TERM GOAL #5   Title Pt. will demonstrate/report ability to perform usual  work at [employer].    Time 8    Period Weeks    Status On-going    Target Date 07/30/21                   Plan - 06/28/21 1045     Clinical Impression Statement Lessened severity of symptoms overall reported today.  Noted trigger points c concordant localized symptoms noted in Lt glute med/max and treated today for first time.  Pt. tolerated well.  Noted reduced hip complaints c movement in clinic today overall.  Continued skilled PT services c HEP indicated at this time.    Examination-Activity Limitations Sit;Sleep;Bed Mobility;Bend;Squat;Stairs;Carry;Stand;Transfers;Dressing;Hygiene/Grooming;Lift;Locomotion Level;Reach Overhead    Examination-Participation Restrictions Cleaning;Shop;Community Activity;Yard Work;Laundry;Meal Prep;Occupation    Stability/Clinical Decision Making Stable/Uncomplicated    Rehab Potential Good    PT Frequency --   1-2x/week   PT Duration 8 weeks    PT Treatment/Interventions ADLs/Self Care Home Management;Electrical Stimulation;Cryotherapy;Moist Heat;Iontophoresis 73m/ml Dexamethasone;Traction;Balance training;Therapeutic exercise;Therapeutic activities;Functional mobility training;Stair training;Gait training;DME Instruction;Ultrasound;Neuromuscular re-education;Patient/family education;Passive range of motion;Spinal Manipulations;Joint Manipulations;Dry needling;Taping;Manual techniques    PT Next Visit Plan STM to Lt lateral/posterior hip follow up.    PT Home Exercise Plan [email or web address]    Consulted and Agree with Plan of Care Patient             Patient will benefit from skilled therapeutic intervention in order to improve the following deficits and impairments:  Pain, Increased fascial restricitons, Decreased activity tolerance, Decreased mobility, Difficulty walking, Increased muscle spasms, Improper body mechanics, Impaired perceived functional ability, Impaired flexibility, Postural dysfunction, Decreased range of motion, Decreased  coordination  Visit Diagnosis: Acute left-sided low back pain without sciatica  Abnormal posture  Difficulty in walking, not elsewhere classified     Problem List Patient Active Problem List   Diagnosis Date Noted   Panic 11/22/2018   Mood disorder ([place] 11/22/2018   Asthma with exacerbation 05/13/2016   Allergic rhinitis 05/13/2016   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

## 2021-07-05 ENCOUNTER — Encounter: Payer: BC Managed Care – PPO | Admitting: Rehabilitative and Restorative Service Providers"

## 2021-07-05 ENCOUNTER — Ambulatory Visit: Payer: BC Managed Care – PPO | Admitting: Psychiatry

## 2021-07-05 ENCOUNTER — Ambulatory Visit: Payer: BC Managed Care – PPO | Admitting: Family Medicine

## 2021-07-05 ENCOUNTER — Encounter: Payer: Self-pay | Admitting: Family Medicine

## 2021-07-05 ENCOUNTER — Other Ambulatory Visit: Payer: Self-pay

## 2021-07-05 VITALS — BP 128/80 | HR 96 | Temp 98.4°F | Resp 16 | Ht 62.0 in | Wt 178.5 lb

## 2021-07-05 DIAGNOSIS — R059 Cough, unspecified: Secondary | ICD-10-CM

## 2021-07-05 DIAGNOSIS — J4521 Mild intermittent asthma with (acute) exacerbation: Secondary | ICD-10-CM

## 2021-07-05 DIAGNOSIS — J069 Acute upper respiratory infection, unspecified: Secondary | ICD-10-CM | POA: Diagnosis not present

## 2021-07-05 DIAGNOSIS — H1031 Unspecified acute conjunctivitis, right eye: Secondary | ICD-10-CM

## 2021-07-05 DIAGNOSIS — J029 Acute pharyngitis, unspecified: Secondary | ICD-10-CM | POA: Diagnosis not present

## 2021-07-05 LAB — POCT RAPID STREP A (OFFICE): Rapid Strep A Screen: NEGATIVE

## 2021-07-05 MED ORDER — BENZONATATE 100 MG PO CAPS: 200.0000 mg | ORAL_CAPSULE | Freq: Two times a day (BID) | ORAL | 0 refills | Status: AC | PRN

## 2021-07-05 MED ORDER — CIPROFLOXACIN HCL 0.3 % OP SOLN
1.0000 [drp] | OPHTHALMIC | 0 refills | Status: AC
Start: 2021-07-05 — End: 2021-07-12

## 2021-07-05 MED ORDER — HYDROCODONE BIT-HOMATROP MBR 5-1.5 MG/5ML PO SOLN: 5.0000 mL | Freq: Two times a day (BID) | ORAL | 0 refills | Status: AC | PRN

## 2021-07-05 NOTE — Patient Instructions (Addendum)
A few things to remember from today's visit:   Sore throat - Plan: POC Rapid Strep A  Mild intermittent asthma with acute exacerbation  URI, acute  Acute conjunctivitis of right eye, unspecified acute conjunctivitis type - Plan: ciprofloxacin (CILOXAN) 0.3 % ophthalmic solution  Cough - Plan: benzonatate (TESSALON) 100 MG capsule, HYDROcodone bit-homatropine (HYCODAN) 5-1.5 MG/5ML syrup  Albuterol inh 2 puff every 6 hours for a week then as needed for wheezing or shortness of breath.   If you need refills please call your pharmacy. Do not use My Chart to request refills or for acute issues that need immediate attention.    Please be sure medication list is accurate. If a new problem present, please set up appointment sooner than planned today.

## 2021-07-05 NOTE — Progress Notes (Signed)
Chief Complaint  Patient presents with   Sore Throat    Ongoing since 7/16, son had the same symptoms. All covid tests have been negative   Cough    Started about a week ago, causing her asthma to flare up.   Conjunctivitis    Having yellow drainage & matting.   HPI: Danielle [name] is a 40 y.o. female, who is here today with above complaints. Started with symptoms about 9 days ago.  Initially she had associated fever, 99 F. Chills x 1-2 days. +Fatigue. Sore throat is exacerbated by swallowing. Negative for dysphagia, stridor, or heartburn.  Cough, sometimes productive with yellowish sputum, negative for hemoptysis. Chest tightness and shortness of breath. No significant wheezing, had some 2 days ago. Cough is interfering with sleep.  Currently she is on albuterol 2 puffs every 4-6 hours as needed, which has helped.  Her son was sick with similar symptoms about 2 weeks ago, he is still coughing.  COVID 19 vaccination completed. Allergic rhinitis: She has not had significant nasal congestion or rhinorrhea.  Yesterday she started with right conjunctival erythema and yellowish drainage. Mild pruritus. Occasionally blurry vision. Negative for diplopia or hemianopsia.  Since her last visit she has followed with orthopedics for her lower back pain. She had MRI done on 06/24/21. Currently she is on oral prednisone. Pain is exacerbated by sneezing and coughing.  Review of Systems  Constitutional:  Negative for activity change and chills.  HENT:  Negative for ear discharge, ear pain, mouth sores and nosebleeds.   Eyes:  Negative for photophobia and pain.  Cardiovascular:  Negative for chest pain, palpitations and leg swelling.  Gastrointestinal:  Negative for abdominal pain, nausea and vomiting.  Musculoskeletal:  Negative for gait problem and myalgias.  Skin:  Negative for rash.  Neurological:  Negative for syncope, facial asymmetry and weakness.  Hematological:   Negative for adenopathy. Does not bruise/bleed easily.  Rest see pertinent positives and negatives per HPI.  Current Outpatient Medications on File Prior to Visit  Medication Sig Dispense Refill   albuterol (VENTOLIN HFA) 108 (90 Base) MCG/ACT inhaler Inhale 2 puffs into the lungs every 6 (six) hours as needed. For wheeze or shortness of breath 8.5 g 2   amitriptyline (ELAVIL) 10 MG tablet TAKE 1 TABLET BY MOUTH EVERYDAY AT BEDTIME 90 tablet 1   baclofen (LIORESAL) 10 MG tablet Take 0.5-1 tablets (5-10 mg total) by mouth 3 (three) times daily as needed for muscle spasms. 30 each 3   BIOTIN PO Take by mouth.     cetirizine (ZYRTEC) 10 MG tablet Take 10 mg by mouth daily as needed.      diazepam (VALIUM) 5 MG tablet 1 PO 1 hour before MRI, repeat prn 5 tablet 0   Multiple Vitamin (MULTIVITAMIN) tablet Take 1 tablet by mouth daily.     pantoprazole (PROTONIX) 40 MG tablet TAKE 1 TABLET BY MOUTH EVERY DAY 90 tablet 2   predniSONE (DELTASONE) 10 MG tablet Take as directed for 12 days.  Daily dose 6,6,5,5,4,4,3,3,2,2,1,1. 42 tablet 0   clonazePAM (KLONOPIN) 0.5 MG tablet Take 1 tablet (0.5 mg total) by mouth 2 (two) times daily as needed for anxiety. 60 tablet 3   No current facility-administered medications on file prior to visit.   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  Tobacco Use   Smoking status: Never   Smokeless tobacco: Never  Vaping Use   Vaping Use: Never used  Substance and  Sexual Activity   Alcohol use: Yes    Alcohol/week: 1.0 standard drink    Types: 1 Standard drinks or equivalent per week    Comment: occasionally   Drug use: No   Sexual activity: Not on file  Other Topics Concern   Not on file  Social History Narrative   Not on file   Social Determinants of Health   Financial Resource Strain: Not on file  Food Insecurity: Not on file  Transportation Needs: Not on file  Physical Activity: Not on file  Stress: Not on file  Social Connections: Not on file   Vitals:   07/05/21 1225 07/05/21 1324  BP: 128/80   Pulse: (!) 107 96  Resp: 16   Temp: 98.4 F (36.9 C)   SpO2: 98%    Body mass index is 32.65 kg/m.  Physical Exam Vitals and nursing note reviewed.  Constitutional:      General: She is not in acute distress.    Appearance: She is well-developed. She is not ill-appearing.  HENT:     Head: Atraumatic.     Right Ear: Tympanic membrane, ear canal and external ear normal.     Left Ear: Tympanic membrane, ear canal and external ear normal.     Nose: Congestion and rhinorrhea present.     Right Sinus: No maxillary sinus tenderness or frontal sinus tenderness.     Left Sinus: No maxillary sinus tenderness or frontal sinus tenderness.     Mouth/Throat:     Lips: No lesions.     Mouth: Mucous membranes are moist.     Tongue: No lesions.     Pharynx: Uvula midline. Posterior oropharyngeal erythema present. No pharyngeal swelling, oropharyngeal exudate or uvula swelling.     Tonsils: No tonsillar exudate.  Eyes:     Extraocular Movements: Extraocular movements intact.     Conjunctiva/sclera:     Right eye: Right conjunctiva is injected. No exudate. Cardiovascular:     Rate and Rhythm: Normal rate and regular rhythm.     Heart sounds: No murmur heard. Pulmonary:     Effort: Pulmonary effort is normal. No respiratory distress.     Breath sounds: Normal breath sounds. No stridor.  Lymphadenopathy:     Head:     Right side of head:  No submandibular adenopathy.     Left side of head: No submandibular adenopathy.     Cervical: No cervical adenopathy.  Skin:    General: Skin is warm.     Findings: No erythema or rash.  Neurological:     Mental Status: She is alert and oriented to person, place, and time.  Psychiatric:        Mood and Affect: Mood is anxious.     Comments: Well groomed, good eye contact.   ASSESSMENT AND PLAN:  Danielle [name] was seen today for sore throat, cough and  conjunctivitis.  Diagnoses and all orders for this visit:  Mild intermittent asthma with acute exacerbation Today lung auscultation negative for wheezing. Albuterol inh 2 puff every 6 hours for a week then as needed for wheezing or shortness of breath.  She is already taking Prednisone for lumbar radiculopathy. Instructed about warning signs.  URI, acute Improving, now having residual post viral symptoms. Hx suggest viral synd.  Adequate hydration. Monitor for new associated symptoms.  Sore throat -     POC Rapid Strep A -     Culture, Group A Strep  Acute conjunctivitis of right eye, unspecified acute conjunctivitis type Possible etiologies discussed. Could be viral or allergic , in which case abx will not help. Instructed about warning signs.  -     ciprofloxacin (CILOXAN) 0.3 % ophthalmic solution; Place 1 drop into the right eye every 4 (four) hours while awake for 7 days.  Cough Explained that cough can last a few more days and even weeks after URI. Adequate hydration. I do not think imaging is needed today. If cough is not any better in 7-10 days, we need to consider CXR.  -     benzonatate (TESSALON) 100 MG capsule; Take 2 capsules (200 mg total) by mouth 2 (two) times daily as needed for up to 10 days. -     HYDROcodone bit-homatropine (HYCODAN) 5-1.5 MG/5ML syrup; Take 5 mLs by mouth every 12 (twelve) hours as needed for up to 10 days for cough.   Return if symptoms worsen or fail to improve.   Danielle Koudelka G. Martinique,  MD  [facility]. [place] office.

## 2021-07-07 LAB — CULTURE, GROUP A STREP
MICRO NUMBER:: 12158559
SPECIMEN QUALITY:: ADEQUATE

## 2021-07-09 ENCOUNTER — Encounter: Payer: Self-pay | Admitting: Family Medicine

## 2021-07-09 ENCOUNTER — Encounter: Payer: BC Managed Care – PPO | Admitting: Physical Therapy

## 2021-07-12 ENCOUNTER — Other Ambulatory Visit: Payer: Self-pay

## 2021-07-12 ENCOUNTER — Encounter: Payer: Self-pay | Admitting: Rehabilitative and Restorative Service Providers"

## 2021-07-12 ENCOUNTER — Ambulatory Visit (INDEPENDENT_AMBULATORY_CARE_PROVIDER_SITE_OTHER): Payer: BC Managed Care – PPO | Admitting: Rehabilitative and Restorative Service Providers"

## 2021-07-12 DIAGNOSIS — R262 Difficulty in walking, not elsewhere classified: Secondary | ICD-10-CM | POA: Diagnosis not present

## 2021-07-12 DIAGNOSIS — R293 Abnormal posture: Secondary | ICD-10-CM | POA: Diagnosis not present

## 2021-07-12 DIAGNOSIS — M545 Low back pain, unspecified: Secondary | ICD-10-CM | POA: Diagnosis not present

## 2021-07-12 MED ORDER — GABAPENTIN 100 MG PO CAPS
ORAL_CAPSULE | ORAL | 3 refills | Status: DC
Start: 2021-07-12 — End: 2021-10-07

## 2021-07-12 NOTE — Addendum Note (Signed)
Addended by: Hortencia Pilar on: 07/12/2021 04:14 PM   Modules accepted: Orders

## 2021-07-12 NOTE — Therapy (Addendum)
[facility] [address] [place], [place], [address] Phone: [phone]   Fax:  [phone]  Physical Therapy Treatment/Progress Note  / Discharge  Patient Details  Name: Danielle [name] MRN: [id number] Date of Birth: August 24, 1981 Referring Provider (PT): Dr. Eunice Blase   Encounter Date: 07/12/2021  Progress Note Reporting Period 06/04/2021 to 07/12/2021  See note below for Objective Data and Assessment of Progress/Goals.       PT End of Session - 07/12/21 1207     Visit Number 5    Number of Visits 12    Date for PT Re-Evaluation 07/30/21    Progress Note Due on Visit 10    PT Start Time 1146    PT Stop Time 1222    PT Time Calculation (min) 36 min    Activity Tolerance Patient limited by pain    Behavior During Therapy WFL for tasks assessed/performed             Past Medical History:  Diagnosis Date   Asthma    Constipation    Generalized headaches    Other and unspecified ovarian cysts    Pulse irregularity    Rapid heart rate    Seasonal allergies     Past Surgical History:  Procedure Laterality Date   DILATION AND CURETTAGE OF UTERUS  2005    There were no vitals filed for this visit.   Subjective Assessment - 07/12/21 1205     Subjective Pt. indicated medicine starting this weekend.  Pt. indicated feeling some lessening of symptoms overall but still noted.  Pt. also indicated trying patches on Lt lower back/hip wth some improvement.    Limitations House hold activities;Sitting;Lifting;Standing;Walking    Patient Stated Goals Reduce pain    Currently in Pain? Yes    Pain Score 6    at worst 8/10, at best 3/10 (prednisone helped)   Pain Location Back    Pain Orientation Left    Pain Descriptors / Indicators Aching;Constant;Shooting;Sharp    Pain Type Chronic pain    Pain Onset More than a month ago    Pain Frequency Constant    Aggravating Factors  constant, also noted "random worse at times."    Pain  Relieving Factors prednisone helped some.                [facility] PT Assessment - 07/12/21 0001       Assessment   Medical Diagnosis Low Back pain    Referring Provider (PT) Dr. Legrand Como Hilts    Onset Date/Surgical Date 04/11/21    Hand Dominance Right      Observation/Other Assessments   Focus on Therapeutic Outcomes (FOTO)  updated 45%      AROM   Lumbar Flexion movement to toes, Slight calf symptom, no back pain produced    Lumbar Extension 50% WFL c low back, posterior Lt hip pain.  Repeated x 5 in standing, no change      Special Tests   Other special tests + slump Lt                           [facility] Adult PT Treatment/Exercise - 07/12/21 0001       Self-Care   Self-Care Other Self-Care Comments    Other Self-Care Comments  Self care education regarding continued use of aerobic exercise for continued physical gains.  Education given on anatomy regarding disc/neurological pressure in lumbar  Exercises   Other Exercises  Verbal review of existing HEP and cues for techniques.  See exercise code for exercise.                    PT Education - 07/12/21 1240     Education Details Extended time spent in presentation education, treatment possibility (injection, surgical, etc ) with plan to return to MD.    Person(s) Educated Patient    Methods Explanation    Comprehension Verbalized understanding              PT Short Term Goals - 06/25/21 1016       PT SHORT TERM GOAL #1   Title Patient will demonstrate independent use of home exercise program to maintain progress from in clinic treatments.    Time 3    Period Weeks    Status Partially Met    Target Date 06/25/21               PT Long Term Goals - 07/12/21 1239       PT LONG TERM GOAL #1   Title Patient will demonstrate/report pain at worst less than or equal to 2/10 to facilitate minimal limitation in daily activity secondary to pain symptoms.    Time 8    Period  Weeks    Status On-going    Target Date 07/30/21      PT LONG TERM GOAL #2   Title Patient will demonstrate independent use of home exercise program to facilitate ability to maintain/progress functional gains from skilled physical therapy services.    Time 8    Period Weeks    Status On-going    Target Date 07/30/21      PT LONG TERM GOAL #3   Title Pt. will demonstrate FOTO outcome > or = 60 %to indicated reduced disability due to condtion.    Time 8    Period Weeks    Status On-going    Target Date 07/30/21      PT LONG TERM GOAL #4   Title Patient will demonstrate lumbar extension 100 % WFL s symptoms to facilitate upright standing, walking posture at PLOF s limitation.    Time 8    Period Weeks    Status On-going    Target Date 07/30/21      PT LONG TERM GOAL #5   Title Pt. will demonstrate/report ability to perform usual work at [employer].    Time 8    Period Weeks    Status On-going    Target Date 07/30/21                   Plan - 07/12/21 1238     Clinical Impression Statement Pt. has attended 5 visits overall with self report minimal change in overall symptoms presentation at this time.  See objective data for updated information showing very mild changes in FOTO and overal movement patterns.  After extended discussion today in clinic, Pt. has decided to place treatment in clinic on hold (continued HEP) and will revisit with MD office for future investigation for additional treatment options.    Examination-Activity Limitations Sit;Sleep;Bed Mobility;Bend;Squat;Stairs;Carry;Stand;Transfers;Dressing;Hygiene/Grooming;Lift;Locomotion Level;Reach Overhead    Examination-Participation Restrictions Cleaning;Shop;Community Activity;Yard Work;Laundry;Meal Prep;Occupation    Stability/Clinical Decision Making Stable/Uncomplicated    Rehab Potential Good    PT Frequency --   1-2x/week   PT Duration 8 weeks    PT Treatment/Interventions ADLs/Self Care Home  Management;Electrical Stimulation;Cryotherapy;Moist Heat;Iontophoresis 71m/ml Dexamethasone;Traction;Balance training;Therapeutic exercise;Therapeutic activities;Functional  mobility training;Stair training;Gait training;DME Instruction;Ultrasound;Neuromuscular re-education;Patient/family education;Passive range of motion;Spinal Manipulations;Joint Manipulations;Dry needling;Taping;Manual techniques    PT Next Visit Plan On hold at this time.    PT Home Exercise Plan [id number]    Consulted and Agree with Plan of Care Patient             Patient will benefit from skilled therapeutic intervention in order to improve the following deficits and impairments:  Pain, Increased fascial restricitons, Decreased activity tolerance, Decreased mobility, Difficulty walking, Increased muscle spasms, Improper body mechanics, Impaired perceived functional ability, Impaired flexibility, Postural dysfunction, Decreased range of motion, Decreased coordination  Visit Diagnosis: Acute left-sided low back pain without sciatica  Abnormal posture  Difficulty in walking, not elsewhere classified     Problem List Patient Active Problem List   Diagnosis Date Noted   Panic 11/22/2018   Mood disorder ([place]) 11/22/2018   Asthma with exacerbation 05/13/2016   Allergic rhinitis 05/13/2016   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 Patient goals were partially met. Patient is being discharged due to not returning since the last visit. (Was referred back to MD office)  Scot Jun, PT, DPT, OCS, ATC 08/23/21  12:02 PM      [facility] [address] [place], [place], [address] Phone: [phone]   Fax:  [phone]  Name: Annalise Mcdiarmid MRN: [id number] Date of Birth: Apr 25, 1981

## 2021-07-14 NOTE — Telephone Encounter (Signed)
PT would like for the antibiotic to be called in. PT was unable to reply through [facility] but wants it to be called in.

## 2021-07-15 NOTE — Addendum Note (Signed)
Addended by: Hortencia Pilar on: 07/15/2021 09:46 AM   Modules accepted: Orders

## 2021-07-16 ENCOUNTER — Encounter: Payer: BC Managed Care – PPO | Admitting: Physical Therapy

## 2021-07-16 ENCOUNTER — Telehealth: Payer: Self-pay

## 2021-07-16 ENCOUNTER — Other Ambulatory Visit: Payer: Self-pay | Admitting: Family Medicine

## 2021-07-16 DIAGNOSIS — F411 Generalized anxiety disorder: Secondary | ICD-10-CM

## 2021-07-16 DIAGNOSIS — J019 Acute sinusitis, unspecified: Secondary | ICD-10-CM

## 2021-07-16 MED ORDER — AMOXICILLIN-POT CLAVULANATE 875-125 MG PO TABS: 1.0000 | ORAL_TABLET | Freq: Two times a day (BID) | ORAL | 0 refills | Status: AC

## 2021-07-16 NOTE — Telephone Encounter (Signed)
Pt has requested Rx for her appt on 8/22, please advise

## 2021-07-19 ENCOUNTER — Other Ambulatory Visit: Payer: Self-pay

## 2021-07-19 ENCOUNTER — Ambulatory Visit: Payer: BC Managed Care – PPO | Admitting: Psychiatry

## 2021-07-19 DIAGNOSIS — F411 Generalized anxiety disorder: Secondary | ICD-10-CM | POA: Diagnosis not present

## 2021-07-19 NOTE — Progress Notes (Signed)
[facility] Counselor/Therapist Progress Note  Patient ID: Danielle [name], MRN: [id number],    Date: 07/19/2021  Time Spent: 52 minutes start time 8:07 AM end time 8:59 AM  Treatment Type: Individual Therapy  Reported Symptoms: health issues, anxiety, pain issues, sleep issues,sadness, irritability, rumination  Mental Status Exam:  Appearance:   Well Groomed     Behavior:  Appropriate  Motor:  Normal  Speech/Language:   Normal Rate  Affect:  Appropriate teraful  Mood:  anxious and sad  Thought process:  normal  Thought content:    WNL  Sensory/Perceptual disturbances:    WNL  Orientation:  oriented to person, place, time/date, and situation  Attention:  Good  Concentration:  Good  Memory:  WNL  Fund of knowledge:   Good  Insight:    Good  Judgment:   Good  Impulse Control:  Good   Risk Assessment: Danger to Self:  No Self-injurious Behavior: No Danger to Others: No Duty to Warn:no Physical Aggression / Violence:No  Access to Firearms a concern: No  Gang Involvement:No   Subjective: Patient was present for session.  She shared that she had a sickness for a few weeks and is continuing to have issues with her back and the pain is keeping her from functioning the way she wants to so she reported being very discouraged.  She was able to do her MRI even though it is very difficult.  She shared that this has been going on since May and she is not sure when it is going to end which is hard for her.  Patient went on to share that she has been doing better with her perspective with her parents since last session.  She is still however having difficulty affirming herself and realizing she is enough.  She shared she feels the younger parts were always working to try to improve they were enough by making straight A's and taking on lots of activities where they could get recognition and hopeful approval from their parents.  Patient was able to identify the 8 being the early stated  she could remember.  Did EMDR imaginal nurturing exercise with that part of her.  Patient reported feeling that it was a positive exercise and that she could feel the connection.  Discussed the importance of working on her regular affirmations and starting to write out all the things that she does in a day so that she can recognize her accomplishments are enough.  Patient was also able to realize she has to accept the fact that her relationship with her mother will never be what she had hoped it would be.  Ways to help her talk herself through moving forward were discussed with patient.  Interventions: Cognitive Behavioral Therapy, Solution-Oriented/Positive Psychology, Eye Movement Desensitization and Reprocessing (EMDR), and Insight-Oriented  Diagnosis:   ICD-10-CM   1. Generalized anxiety disorder  F41.1       Plan: Patient is to use CBT and coping skills to decrease anxiety symptoms.  Patient is to continue working with her providers on her health issues including her chronic back problems.  Patient is to write out all she accomplishes in a day and work on affirming herself regularly.  Patient is to take medication as directed. Long-term goal: Resolve the core conflict that is the source of anxiety-decrease rumination by 50% Short-term goal: Identify the major life complex from the past and present that form the basis for present anxiety  Lina Sayre, [facility]

## 2021-07-26 MED ORDER — DIAZEPAM 5 MG PO TABS: ORAL_TABLET | ORAL | 0 refills | Status: DC

## 2021-07-26 NOTE — Telephone Encounter (Signed)
Pre-procedure diazepam ordered for pre-operative anxiety.  

## 2021-07-26 NOTE — Addendum Note (Signed)
Addended by: Raymondo Band on: 07/26/2021 03:09 PM   Modules accepted: Orders

## 2021-08-02 ENCOUNTER — Ambulatory Visit: Payer: BC Managed Care – PPO | Admitting: Psychiatry

## 2021-08-02 ENCOUNTER — Ambulatory Visit: Payer: Self-pay

## 2021-08-02 ENCOUNTER — Encounter: Payer: Self-pay | Admitting: Physical Medicine and Rehabilitation

## 2021-08-02 ENCOUNTER — Ambulatory Visit (INDEPENDENT_AMBULATORY_CARE_PROVIDER_SITE_OTHER): Payer: BC Managed Care – PPO | Admitting: Physical Medicine and Rehabilitation

## 2021-08-02 ENCOUNTER — Other Ambulatory Visit: Payer: Self-pay

## 2021-08-02 VITALS — BP 127/91 | HR 94

## 2021-08-02 DIAGNOSIS — M5416 Radiculopathy, lumbar region: Secondary | ICD-10-CM | POA: Diagnosis not present

## 2021-08-02 DIAGNOSIS — M5442 Lumbago with sciatica, left side: Secondary | ICD-10-CM

## 2021-08-02 DIAGNOSIS — G8929 Other chronic pain: Secondary | ICD-10-CM

## 2021-08-02 DIAGNOSIS — F411 Generalized anxiety disorder: Secondary | ICD-10-CM

## 2021-08-02 DIAGNOSIS — M5116 Intervertebral disc disorders with radiculopathy, lumbar region: Secondary | ICD-10-CM | POA: Diagnosis not present

## 2021-08-02 MED ORDER — METHYLPREDNISOLONE ACETATE 80 MG/ML IJ SUSP
80.0000 mg | Freq: Once | INTRAMUSCULAR | Status: AC
  Administered 2021-08-02: 80 mg

## 2021-08-02 NOTE — Progress Notes (Signed)
Pt state lower back pain that travels down her left hip, buttock and into her leg. Pt state  sitting, twisting and bending makes pain worse. Pt state getting out of bed makes the pain worse. Pt state she takes over the counter pain meds to help ease her pain.  Numeric Pain Rating Scale and Functional Assessment Average Pain 8   In the last MONTH (on 0-10 scale) has pain interfered with the following?  1. General activity like being  able to carry out your everyday physical activities such as walking, climbing stairs, carrying groceries, or moving a chair?  Rating(10)   +Driver, -BT, -Dye Allergies.

## 2021-08-02 NOTE — Procedures (Signed)
S1 Lumbosacral Transforaminal Epidural Steroid Injection - Sub-Pedicular Approach with Fluoroscopic Guidance   Patient: Danielle [name]      Date of Birth: 13-Feb-1981 MRN: [id number] PCP: Martinique, Betty G, MD      Visit Date: 08/02/2021   Universal Protocol:    Date/Time: 08/02/2209:00 AM  Consent Given By: the patient  Position:  PRONE  Additional Comments: Vital signs were monitored before and after the procedure. Patient was prepped and draped in the usual sterile fashion. The correct patient, procedure, and site was verified.   Injection Procedure Details:  Procedure Site One Meds Administered:  Meds ordered this encounter  Medications   methylPREDNISolone acetate (DEPO-MEDROL) injection 80 mg    Laterality: Left  Location/Site:  S1 Foramen   Needle size: 22 ga.  Needle type: Spinal  Needle Placement: Transforaminal  Findings:   -Comments: Excellent flow of contrast along the nerve, nerve root and into the epidural space.  Epidurogram: Contrast epidurogram showed no nerve root cut off or restricted flow pattern.  Procedure Details: After squaring off the sacral end-plate to get a true AP view, the C-arm was positioned so that the best possible view of the S1 foramen was visualized. The soft tissues overlying this structure were infiltrated with 2-3 ml. of 1% Lidocaine without Epinephrine.    The spinal needle was inserted toward the target using a "trajectory" view along the fluoroscope beam.  Under AP and lateral visualization, the needle was advanced so it did not puncture dura. Biplanar projections were used to confirm position. Aspiration was confirmed to be negative for CSF and/or blood. A 1-2 ml. volume of Isovue-250 was injected and flow of contrast was noted at each level. Radiographs were obtained for documentation purposes.   After attaining the desired flow of contrast documented above, a 0.5 to 1.0 ml test dose of 0.25% Marcaine was injected into  each respective transforaminal space.  The patient was observed for 90 seconds post injection.  After no sensory deficits were reported, and normal lower extremity motor function was noted,   the above injectate was administered so that equal amounts of the injectate were placed at each foramen (level) into the transforaminal epidural space.   Additional Comments:  The patient tolerated the procedure well Dressing: Band-Aid with 2 x 2 sterile gauze    Post-procedure details: Patient was observed during the procedure. Post-procedure instructions were reviewed.  Patient left the clinic in stable condition.

## 2021-08-02 NOTE — Patient Instructions (Signed)

## 2021-08-02 NOTE — Progress Notes (Signed)
Danielle [name] - 40 y.o. female MRN [id number]  Date of birth: 04/23/1981  Office Visit Note: Visit Date: 08/02/2021 PCP: Martinique, Betty G, MD Referred by: Martinique, Betty G, MD  Subjective: Chief Complaint  Patient presents with   Lower Back - Pain   Left Hip - Pain   Left Leg - Pain   HPI:  Danielle [name] is a 40 y.o. female who comes in today at the request of Dr. Eunice Blase for evaluation and management of chronic, worsening and severe left lower back and left hip and leg pain that has been ongoing now since the beginning of May.  She initially reported acute onset of left buttock pain that may have come on while she was gardening.  She received Medrol Dosepak and I did alleviate some of the pain but then the symptoms returned and actually got worse.  She was using ibuprofen and heat and ice etc. but with only mild relief.  She had a similar episode on the right side in 2021 that did get relief with Medrol Dosepak.  She was treated by her primary care physician and ultimately referred to Dr. Eunice Blase.  She has been having physical therapy and consistently has been doing the exercises there.  She rates her pain as an 8 out of 10 and really significantly limits her daily activities.  She is very tearful with the ongoing pain without any relief.  She is very worried about the longevity of the problem.  She gets worsening with prolonged sitting as well as twisting and bending.  Better with walking.  She has not had any focal weakness or specific paresthesia but pain is in a pretty classic S1 dermatome.  This goes into the posterior lateral leg and lateral foot on the left.  No right-sided complaints.  She has had no prior lumbar surgery or injections.  She is on a very small amount of gabapentin and is increasing that.  She does take Elavil at night which has been more for headaches.  She continues with anti-inflammatory.  She has had no red flag complaints of focal weakness or bowel or  bladder changes etc.  MRI of the lumbar spine was reviewed today and reviewed with her using spine models and imaging.     Review of Systems  Musculoskeletal:  Positive for back pain.       Left hip and leg pain  All other systems reviewed and are negative. Otherwise per HPI.  Assessment & Plan: Visit Diagnoses:    ICD-10-CM   1. Lumbar radiculopathy  M54.16 XR C-ARM NO REPORT    Epidural Steroid injection    methylPREDNISolone acetate (DEPO-MEDROL) injection 80 mg    2. Radiculopathy due to lumbar intervertebral disc disorder  M51.16 XR C-ARM NO REPORT    Epidural Steroid injection    methylPREDNISolone acetate (DEPO-MEDROL) injection 80 mg    3. Chronic left-sided low back pain with left-sided sciatica  M54.42    G89.29       Plan: Findings:  Chronic worsening and severe left hip and leg pain in a pretty classic S1 distribution with MRI findings of disc protrusion and subarticular lateral recess narrowing at L5-S1.  This does fit with her symptoms on the left and this classic dermatome.  She is tried and failed all manner of conservative care including medication management as well as time and physical therapy.  The neck step is diagnostic and hopefully therapeutic left S1 transforaminal dural steroid injection.  With a long discussion today about the natural history of disc protrusions and herniations.  We also discussed natural history of pain and nerve root pain itself.  We discussed briefly surgical approaches to that as well as injections.  Went over injection treatment quite thoroughly.  Her case is complicated by significant anxiety.  She did receive preprocedure Valium.  Depending on results with the injection could look at interlaminar approach versus repeat S1 injection if beneficial but not as much as she had hoped.  She will continue with current exercise program as well as current medications.   Meds & Orders:  Meds ordered this encounter  Medications    methylPREDNISolone acetate (DEPO-MEDROL) injection 80 mg    Orders Placed This Encounter  Procedures   XR C-ARM NO REPORT   Epidural Steroid injection    Follow-up: Return if symptoms worsen or fail to improve.   Procedures: No procedures performed  S1 Lumbosacral Transforaminal Epidural Steroid Injection - Sub-Pedicular Approach with Fluoroscopic Guidance   Patient: [name]      Date of Birth: [birth year] MRN: [id number] PCP: Martinique, Betty G, MD      Visit Date: 08/02/2021   Universal Protocol:    Date/Time: 08/02/2209:00 AM  Consent Given By: the patient  Position:  PRONE  Additional Comments: Vital signs were monitored before and after the procedure. Patient was prepped and draped in the usual sterile fashion. The correct patient, procedure, and site was verified.   Injection Procedure Details:  Procedure Site One Meds Administered:  Meds ordered this encounter  Medications   methylPREDNISolone acetate (DEPO-MEDROL) injection 80 mg    Laterality: Left  Location/Site:  S1 Foramen   Needle size: 22 ga.  Needle type: Spinal  Needle Placement: Transforaminal  Findings:   -Comments: Excellent flow of contrast along the nerve, nerve root and into the epidural space.  Epidurogram: Contrast epidurogram showed no nerve root cut off or restricted flow pattern.  Procedure Details: After squaring off the sacral end-plate to get a true AP view, the C-arm was positioned so that the best possible view of the S1 foramen was visualized. The soft tissues overlying this structure were infiltrated with 2-3 ml. of 1% Lidocaine without Epinephrine.    The spinal needle was inserted toward the target using a "trajectory" view along the fluoroscope beam.  Under AP and lateral visualization, the needle was advanced so it did not puncture dura. Biplanar projections were used to confirm position. Aspiration was confirmed to be negative for CSF and/or blood. A 1-2 ml.  volume of Isovue-250 was injected and flow of contrast was noted at each level. Radiographs were obtained for documentation purposes.   After attaining the desired flow of contrast documented above, a 0.5 to 1.0 ml test dose of 0.25% Marcaine was injected into each respective transforaminal space.  The patient was observed for 90 seconds post injection.  After no sensory deficits were reported, and normal lower extremity motor function was noted,   the above injectate was administered so that equal amounts of the injectate were placed at each foramen (level) into the transforaminal epidural space.   Additional Comments:  The patient tolerated the procedure well Dressing: Band-Aid with 2 x 2 sterile gauze    Post-procedure details: Patient was observed during the procedure. Post-procedure instructions were reviewed.  Patient left the clinic in stable condition.   Clinical History: MRI LUMBAR SPINE WITHOUT CONTRAST   TECHNIQUE: Multiplanar, multisequence MR imaging of the lumbar spine was performed. No  intravenous contrast was administered.   COMPARISON:  None.   FINDINGS: Segmentation:  Standard.   Alignment:  Physiologic.   Vertebrae:  No fracture, evidence of discitis, or bone lesion.   Conus medullaris and cauda equina: Conus extends to the L2 level. Conus and cauda equina appear normal.   Paraspinal and other soft tissues: Choose   Disc levels:   T12-L1: Normal.   L1-L2: Normal disc space and facet joints. No spinal canal stenosis. No neural foraminal stenosis.   L2-L3: Normal disc space and facet joints. No spinal canal stenosis. No neural foraminal stenosis.   L3-L4: Normal disc space and facet joints. No spinal canal stenosis. No neural foraminal stenosis.   L4-L5: Small right foraminal disc protrusion close proximity to the exiting right L4 nerve root. No spinal canal stenosis. No neural foraminal stenosis.   L5-S1: Small left subarticular disc extrusion  with minimal inferior migration. Left lateral recess narrowing without central spinal canal stenosis. No neural foraminal stenosis.   Visualized sacrum: Normal.   IMPRESSION: 1. L5-S1 small left subarticular disc extrusion with minimal inferior migration, narrowing the left lateral recess, a potential source of left S1 radiculopathy. 2. Small right foraminal disc protrusion at L4-L5 close proximity to the exiting right L4 nerve root, a potential source of right L4 radiculopathy.     Electronically Signed   By: Ulyses Jarred M.D.   On: 06/24/2021 16:22     Objective:  VS:  HT:    WT:   BMI:     BP:(!) 127/91  HR:94bpm  TEMP: ( )  RESP:  Physical Exam Vitals and nursing note reviewed.  Constitutional:      General: She is not in acute distress.    Appearance: Normal appearance. She is obese. She is not ill-appearing.  HENT:     Head: Normocephalic and atraumatic.     Right Ear: External ear normal.     Left Ear: External ear normal.  Eyes:     Extraocular Movements: Extraocular movements intact.  Cardiovascular:     Rate and Rhythm: Normal rate.     Pulses: Normal pulses.  Pulmonary:     Effort: Pulmonary effort is normal. No respiratory distress.  Abdominal:     General: There is no distension.     Palpations: Abdomen is soft.  Musculoskeletal:        General: Tenderness present.     Cervical back: Neck supple.     Right lower leg: No edema.     Left lower leg: No edema.     Comments: Positive left slump test.  Patient has good distal strength with no pain over the greater trochanters.  No clonus or focal weakness.  Skin:    Findings: No erythema, lesion or rash.  Neurological:     General: No focal deficit present.     Mental Status: She is alert and oriented to person, place, and time.     Sensory: No sensory deficit.     Motor: No weakness or abnormal muscle tone.     Coordination: Coordination normal.  Psychiatric:        Mood and Affect: Mood normal.         Behavior: Behavior normal.     Imaging: XR C-ARM NO REPORT  Result Date: 08/02/2021 Please see Notes tab for imaging impression.

## 2021-08-02 NOTE — Progress Notes (Signed)
[facility] Counselor/Therapist Progress Note  Patient ID: Danielle [name], MRN: [id number],    Date: 08/02/2021  Time Spent: 60 minutes start time 8:04 AM end time 9:04 AM  Treatment Type: Individual Therapy  Reported Symptoms: anxiety, pain, irritability, sadness  Mental Status Exam:  Appearance:   Casual and Neat     Behavior:  Appropriate  Motor:  Normal  Speech/Language:   Normal Rate  Affect:  Appropriate  Mood:  anxious  Thought process:  normal  Thought content:    WNL  Sensory/Perceptual disturbances:    WNL  Orientation:  oriented to person, place, time/date, and situation  Attention:  Good  Concentration:  Good  Memory:  WNL  Fund of knowledge:   Good  Insight:    Good  Judgment:   Good  Impulse Control:  Good   Risk Assessment: Danger to Self:  No Self-injurious Behavior: No Danger to Others: No Duty to Warn:no Physical Aggression / Violence:No  Access to Firearms a concern: No  Gang Involvement:No   Subjective: Patient was present for session.  She shared she was anxious due to preparing for an injection in her back after session.  Patient reported she followed up with the writing assignment from last session and after the processing with EMDR and the writing assignment she was able to feel much better about her relationship with her parents.  Patient stated that the writing helped her realize that she was the heart of her family and that she had taught different things to her children and that there was unconditional love with them for her and for them which helped her to feel more relaxed and be able to let go of the difficulties that she is having especially with her mother.  Patient stated last session helped her realize that the disconnect was her mom's issue and probably was there due to her situation rather than not having anything to do with her.  Patient was encouraged to continue allowing the processing to continue and to remind herself of that  truth regularly.  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  Patient stated she cannot identify where it came from because she has never flown.  At the same time she recalled having her first panic attack when she was in the mall on Christmas Eve.  Patient stated she has tried hypnosis and acupuncture to deal with the issue and nothing has helped.  Started EMDR set on thought of flying and applying, suds level 10, negative cognition "I am trapped" felt anxiety in her chest.  As she was doing the processing she was able to realize that she had lots of social anxiety especially through middle school and she felt trapped in her situation often.  Patient was able to allow some of the memories to surface and work with that younger part of her.  She was only able to reduce suds level to 6 by the end of session.  She reported feeling like she was still in a good place.  Discussed the importance of continuing her journaling to allow processing to continue between sessions.  Interventions: Solution-Oriented/Positive Psychology, Eye Movement Desensitization and Reprocessing (EMDR), and Insight-Oriented  Diagnosis:   ICD-10-CM   1. Generalized anxiety disorder  F41.1       Plan: Patient is to use CBT and coping skills to  continue decreasing anxiety symptoms.  Patient is to continue working on her journaling to allow processing to continue.  Patient is to work with her provider on her back issues.  Patient is to continue taking medication as directed. Long-term goal: Resolve the core conflict that is the source of anxiety-decrease rumination by 50% Short-term goal: Identify the major life complex from the past and present that form the basis for present anxiety  Lina Sayre, [facility]

## 2021-08-18 ENCOUNTER — Other Ambulatory Visit: Payer: Self-pay

## 2021-08-18 ENCOUNTER — Ambulatory Visit: Payer: BC Managed Care – PPO | Admitting: Psychiatry

## 2021-08-18 DIAGNOSIS — F411 Generalized anxiety disorder: Secondary | ICD-10-CM | POA: Diagnosis not present

## 2021-08-18 NOTE — Progress Notes (Signed)
      [facility] Counselor/Therapist Progress Note  Patient ID: Danielle [name], MRN: [id number],    Date: 08/18/2021  Time Spent: 59 minutes start time 10:02 AM end time 11:01 AM  Treatment Type: Individual Therapy  Reported Symptoms: anxiety, panic, phobias  Mental Status Exam:  Appearance:   Well Groomed     Behavior:  Appropriate  Motor:  Normal  Speech/Language:   Normal Rate  Affect:  Appropriate  Mood:  anxious  Thought process:  normal  Thought content:    WNL  Sensory/Perceptual disturbances:    WNL  Orientation:  oriented to person, place, time/date, and situation  Attention:  Good  Concentration:  Good  Memory:  WNL  Fund of knowledge:   Good  Insight:    Good  Judgment:   Good  Impulse Control:  Good   Risk Assessment: Danger to Self:  No Self-injurious Behavior: No Danger to Others: No Duty to Warn:no Physical Aggression / Violence:No  Access to Firearms a concern: No  Gang Involvement:No   Subjective: Patient was present for session.  She shared that she feels she is doing better with her anxiety and dealing with the issues with her parents.   Patient shared that she had gotten the shot in her back and that has helped her pain levels so she is back to being able to do the things that she wants to do which has helped her mood tremendously and she feels her sadness is gone.  Patient stated that she wants to work with her phobia of flying or being trapped.  Patient explained she has been trying to think back to times when she felt trapped.  She discussed being in the cabin with her husband as what happened right before her panic attacks.  Did EMDR set on the cabin, suds level 9, negative cognition "I am trapped" felt fear in her stomach chest and muscle tension.  Patient was able to reduce suds level to 6.  She was able to have some visualizations of recognizing she has been able to get out of [place] multiple times.  She was also able to start thinking about the  possibility of being on a plane which was huge progress.  Patient was encouraged just to notice what happens over the next few weeks and the processing and to continue trying to think of times when she is able to get out of small places and be okay.  Interventions: Solution-Oriented/Positive Psychology, Eye Movement Desensitization and Reprocessing (EMDR), and Insight-Oriented  Diagnosis:   ICD-10-CM   1. Generalized anxiety disorder  F41.1       Plan: Patient is to use CBT and coping skills to decrease anxiety symptoms.  Patient is to work on reminding herself of things that she has been able to get out of that were small tight situations.  Patient is to continue working on her relationship with her mother.  Patient is to take medication as directed. Long-term goal: Resolve the core conflict that is the source of anxiety-decrease rumination by 50% Short-term goal: Identify the major life complex from the past and present that form the basis for present anxiety  Lina Sayre, [facility]

## 2021-08-20 DIAGNOSIS — Z1231 Encounter for screening mammogram for malignant neoplasm of breast: Secondary | ICD-10-CM | POA: Diagnosis not present

## 2021-08-25 ENCOUNTER — Other Ambulatory Visit: Payer: Self-pay | Admitting: Obstetrics and Gynecology

## 2021-08-25 DIAGNOSIS — R928 Other abnormal and inconclusive findings on diagnostic imaging of breast: Secondary | ICD-10-CM

## 2021-08-26 ENCOUNTER — Other Ambulatory Visit: Payer: Self-pay | Admitting: Medical

## 2021-08-27 ENCOUNTER — Ambulatory Visit: Payer: BC Managed Care – PPO | Admitting: Psychiatry

## 2021-08-31 ENCOUNTER — Other Ambulatory Visit: Payer: Self-pay

## 2021-08-31 ENCOUNTER — Ambulatory Visit
Admission: RE | Admit: 2021-08-31 | Discharge: 2021-08-31 | Disposition: A | Discharge status: Home / Self Care | Payer: BC Managed Care – PPO | Source: Ambulatory Visit | Attending: Obstetrics and Gynecology | Admitting: Obstetrics and Gynecology

## 2021-08-31 ENCOUNTER — Other Ambulatory Visit: Payer: Self-pay | Admitting: Obstetrics and Gynecology

## 2021-08-31 DIAGNOSIS — R928 Other abnormal and inconclusive findings on diagnostic imaging of breast: Secondary | ICD-10-CM

## 2021-08-31 DIAGNOSIS — R922 Inconclusive mammogram: Secondary | ICD-10-CM | POA: Diagnosis not present

## 2021-09-03 ENCOUNTER — Encounter: Payer: Self-pay | Admitting: Psychiatry

## 2021-09-03 ENCOUNTER — Ambulatory Visit (INDEPENDENT_AMBULATORY_CARE_PROVIDER_SITE_OTHER): Payer: BC Managed Care – PPO | Admitting: Psychiatry

## 2021-09-03 ENCOUNTER — Other Ambulatory Visit: Payer: Self-pay

## 2021-09-03 DIAGNOSIS — F5105 Insomnia due to other mental disorder: Secondary | ICD-10-CM

## 2021-09-03 DIAGNOSIS — F99 Mental disorder, not otherwise specified: Secondary | ICD-10-CM

## 2021-09-03 DIAGNOSIS — F411 Generalized anxiety disorder: Secondary | ICD-10-CM

## 2021-09-03 DIAGNOSIS — F410 Panic disorder [episodic paroxysmal anxiety] without agoraphobia: Secondary | ICD-10-CM | POA: Diagnosis not present

## 2021-09-03 MED ORDER — CLONAZEPAM 0.5 MG PO TABS: 0.5000 mg | ORAL_TABLET | Freq: Two times a day (BID) | ORAL | 2 refills | Status: DC | PRN

## 2021-09-03 NOTE — Progress Notes (Signed)
Danielle [name] [id number] 12-Feb-1981 40 y.o.  Subjective:   Patient ID:  Danielle [name] is a 40 y.o. (DOB 1981-08-20) female.  Chief Complaint:  Chief Complaint  Patient presents with   Anxiety   Depression    Anxiety    Depression        Past medical history includes anxiety.   Danielle [name] presents to the office today for follow-up of mood disturbance, anxiety, and insomnia. She reports "a lot's been going on" since last visit. She reports starting to have pain in her back in May. She reports that pain worsened and had PT and an MRI. She reports that MRI showed protruding discs. She reports that pain was significantly impacting her function. She had epidural and reports that pain has improved. She reports, "I was getting down about it, dealing with every morning." She reports that this increased her anxiety as well. She reports that she had a mammogram and was called back for diagnostic mammogram and ultrasound. She was told that she may have 2 cysts and will have biopsy next week and has been anxious about this.   Daughter (23 yo) and her boyfriend broke up recently and "her heart is broken and my heart is broken with her." Daughter has had cyclic vomiting in response to breakdown.   Continues to have fear of flying, getting on boats, and claustrophobia. Has had some anxiety going over long bridges. Has had increased anxiety in response to multiple stressors. She reports that she has not had recent full blown panic attack, however she has felt on the verge of panic at times.   Mood has improved some since pain is less after epidural. Energy and motivation have improved some. Sleeping well. Appetite has been ok. She reports that she has been gaining weight. Concentration is challenging at times, particularly with school starting back. Denies manic s/s. Denies SI.   Has seen Lina Sayre, [facility] for therapy.   She reports that she has not had much involvement with her  parents.   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 She reports that owners frequently ask her to work more hours than she is able/willing. Husband travels for his work.   She reports using Klonopin prn about 4 times a week.   Past Psychiatric Medication Trials: Abilify- Tremor Latuda Vraylar- Irritability, Agitation Rexulti- Palpitations Lamictal Viibryd- panic Trintellix- HA's, possible headaches. Prozac Effexor XR Zoloft Lexapro Wellbutrin- Headaches Buspar Xanax- excessive somnolence, ineffective Clorazepate- ineffective Klonopin Ativan Trileptal-ineffective Trazodone- Has had dizziness and nausea with doses above 100 mg.  Deplin Propranolol-Ineffective Clonidine- May have been helpful for panic s/s.  Gabapentin- Noticed restlessness with 900 mg.  Amitriptyline- Helpful for headaches and mood.   [facility] Office Visit from 01/10/2017 in Primary Care at [place] from 10/07/2016 in Primary Care at [place] from 05/13/2016 in Primary Care at [facility] Total Score 0 1 0        Review of Systems:  Review of Systems  Musculoskeletal:  Negative for gait problem.       Intermittent back pain  Psychiatric/Behavioral:  Positive for depression.        Please refer to HPI   Medications: I have reviewed the patient's current medications.  Current Outpatient Medications  Medication Sig Dispense Refill   albuterol (VENTOLIN HFA) 108 (90 Base) MCG/ACT inhaler Inhale 2 puffs into the lungs every 6 (six) hours as needed. For wheeze or shortness of breath  8.5 g 2   amitriptyline (ELAVIL) 10 MG tablet TAKE 1 TABLET BY MOUTH EVERYDAY AT BEDTIME 90 tablet 1   cetirizine (ZYRTEC) 10 MG tablet Take 10 mg by mouth daily as needed.      Multiple Vitamin (MULTIVITAMIN) tablet Take 1 tablet by mouth daily.     pantoprazole (PROTONIX) 40 MG tablet TAKE 1 TABLET BY MOUTH EVERY DAY 90 tablet 2   baclofen (LIORESAL) 10 MG tablet Take 0.5-1 tablets  (5-10 mg total) by mouth 3 (three) times daily as needed for muscle spasms. (Patient not taking: Reported on 09/03/2021) 30 each 3   clonazePAM (KLONOPIN) 0.5 MG tablet Take 1 tablet (0.5 mg total) by mouth 2 (two) times daily as needed for anxiety. 60 tablet 2   diazepam (VALIUM) 5 MG tablet Take 1 by mouth 1 hour  pre-procedure with very light food. May bring 2nd tablet to appointment. (Patient not taking: Reported on 09/03/2021) 2 tablet 0   gabapentin (NEURONTIN) 100 MG capsule 1 PO q HS, may increase to 1 PO TID if needed (Patient not taking: Reported on 09/03/2021) 90 capsule 3   No current facility-administered medications for this visit.    Medication Side Effects: None  Allergies:  Allergies  Allergen Reactions   Doxycycline Nausea And Vomiting    Other reaction(s): Vomiting    Past Medical History:  Diagnosis Date   Asthma    Constipation    Generalized headaches    Other and unspecified ovarian cysts    Pulse irregularity    Rapid heart rate    Seasonal allergies     Past Medical History, Surgical history, Social history, and Family history were reviewed and updated as appropriate.   Please see review of systems for further details on the patient's review from today.   Objective:   Physical Exam:  There were no vitals taken for this visit.  Physical Exam Constitutional:      General: She is not in acute distress. Musculoskeletal:        General: No deformity.  Neurological:     Mental Status: She is alert and oriented to person, place, and time.     Coordination: Coordination normal.  Psychiatric:        Attention and Perception: Attention and perception normal. She does not perceive auditory or visual hallucinations.        Mood and Affect: Mood is anxious and depressed. Affect is not labile, blunt, angry or inappropriate.        Speech: Speech normal.        Behavior: Behavior normal.        Thought Content: Thought content normal. Thought content is not  paranoid or delusional. Thought content does not include homicidal or suicidal ideation. Thought content does not include homicidal or suicidal plan.        Cognition and Memory: Cognition and memory normal.        Judgment: Judgment normal.     Comments: Insight intact    Lab Review:     Component Value Date/Time   NA 139 10/28/2020 1118   K 4.1 10/28/2020 1118   CL 104 10/28/2020 1118   CO2 22 10/28/2020 1118   GLUCOSE 83 10/28/2020 1118   BUN 9 10/28/2020 1118   CREATININE 0.69 10/28/2020 1118   CALCIUM 9.9 10/28/2020 1118   PROT 7.1 11/25/2016 0953   ALBUMIN 4.5 11/25/2016 0953   AST 15 11/25/2016 0953   ALT 15 11/25/2016 0953   ALKPHOS 71  11/25/2016 0953   BILITOT 0.8 11/25/2016 0953   GFRNONAA 109 10/28/2020 1118   GFRAA 126 10/28/2020 1118       Component Value Date/Time   WBC 12.5 (H) 10/02/2012 0006   RBC 4.51 10/02/2012 0006   HGB 14.2 10/02/2012 0006   HCT 38.5 10/02/2012 0006   PLT 214 10/02/2012 0006   MCV 85.4 10/02/2012 0006   MCH 31.5 10/02/2012 0006   MCHC 36.9 (H) 10/02/2012 0006   RDW 12.0 10/02/2012 0006    No results found for: POCLITH, LITHIUM   No results found for: PHENYTOIN, PHENOBARB, VALPROATE, CBMZ   .res Assessment: Plan:    Pt seen for 30 minutes and time spent discussing recent mood and anxiety s/s. She reports that she would like to continue current medications without changes since medications seem to be helpful and better tolerated compared to past meds. Will continue Klonopin 0.5 mg po BID prn anxiety.  She plans to continue Amitriptyline 10 mg po QHS per PCP.  Recommend continuing therapy with Lina Sayre, [facility].  Pt to follow-up with this provider in 6 months or sooner if clinically indicated.  Patient advised to contact office with any questions, adverse effects, or acute worsening in signs and symptoms.   Danielle [name] was seen today for anxiety and depression.  Diagnoses and all orders for this visit:  Insomnia due to other  mental disorder -     clonazePAM (KLONOPIN) 0.5 MG tablet; Take 1 tablet (0.5 mg total) by mouth 2 (two) times daily as needed for anxiety.  Panic disorder -     clonazePAM (KLONOPIN) 0.5 MG tablet; Take 1 tablet (0.5 mg total) by mouth 2 (two) times daily as needed for anxiety.  Generalized anxiety disorder -     clonazePAM (KLONOPIN) 0.5 MG tablet; Take 1 tablet (0.5 mg total) by mouth 2 (two) times daily as needed for anxiety.    Please see After Visit Summary for patient specific instructions.  Future Appointments  Date Time Provider [place]  09/08/2021  7:30 AM [facility] PROCEDURES 2 GI-BCGMM GI-BREAST CE  09/08/2021  8:00 AM [facility] PROCEDURES 2 GI-BCGMM GI-BREAST CE  10/12/2021  1:00 PM Lina Sayre, [facility] CP-CP None  10/25/2021  1:00 PM Lina Sayre, [facility] CP-CP None  11/11/2021  9:00 AM Lina Sayre, [facility] CP-CP None  03/04/2022  9:00 AM Thayer Headings, [facility]NP CP-CP None    No orders of the defined types were placed in this encounter.   -------------------------------

## 2021-09-08 ENCOUNTER — Ambulatory Visit
Admission: RE | Admit: 2021-09-08 | Discharge: 2021-09-08 | Disposition: A | Discharge status: Home / Self Care | Payer: BC Managed Care – PPO | Source: Ambulatory Visit | Attending: Obstetrics and Gynecology | Admitting: Obstetrics and Gynecology

## 2021-09-08 ENCOUNTER — Other Ambulatory Visit: Payer: Self-pay

## 2021-09-08 DIAGNOSIS — N6011 Diffuse cystic mastopathy of right breast: Secondary | ICD-10-CM | POA: Diagnosis not present

## 2021-09-08 DIAGNOSIS — R928 Other abnormal and inconclusive findings on diagnostic imaging of breast: Secondary | ICD-10-CM

## 2021-09-08 DIAGNOSIS — N6311 Unspecified lump in the right breast, upper outer quadrant: Secondary | ICD-10-CM | POA: Diagnosis not present

## 2021-09-08 DIAGNOSIS — D241 Benign neoplasm of right breast: Secondary | ICD-10-CM | POA: Diagnosis not present

## 2021-09-08 DIAGNOSIS — N6341 Unspecified lump in right breast, subareolar: Secondary | ICD-10-CM | POA: Diagnosis not present

## 2021-09-13 ENCOUNTER — Other Ambulatory Visit: Payer: BC Managed Care – PPO

## 2021-09-16 ENCOUNTER — Other Ambulatory Visit: Payer: Self-pay

## 2021-09-16 ENCOUNTER — Ambulatory Visit: Payer: BC Managed Care – PPO | Admitting: Psychiatry

## 2021-09-16 DIAGNOSIS — F411 Generalized anxiety disorder: Secondary | ICD-10-CM | POA: Diagnosis not present

## 2021-09-16 NOTE — Progress Notes (Signed)
      [facility] Counselor/Therapist Progress Note  Patient ID: Danielle [name], MRN: [id number],    Date: 09/16/2021  Time Spent: 50 minutes start time 10:10 AM end time 11 AM  Treatment Type: Individual Therapy  Reported Symptoms: anxiety, physical issues,panic, triggered responses  Mental Status Exam:  Appearance:   Well Groomed     Behavior:  Appropriate  Motor:  Normal  Speech/Language:   Normal Rate  Affect:  Appropriate  Mood:  anxious  Thought process:  normal  Thought content:    WNL  Sensory/Perceptual disturbances:    WNL  Orientation:  oriented to person, place, time/date, and situation  Attention:  Good  Concentration:  Good  Memory:  WNL  Fund of knowledge:   Good  Insight:    Good  Judgment:   Good  Impulse Control:  Good   Risk Assessment: Danger to Self:  No Self-injurious Behavior: No Danger to Others: No Duty to Warn:no Physical Aggression / Violence:No  Access to Firearms a concern: No  Gang Involvement:No   Subjective: Patient was present for session.  She shared she has had an abnormal mammogram and gone through testing with it.  Patient shared that her mother is still not very empathetic and supportive during the process but she is learning to manage that more appropriately.  Patient stated that she still wants to work on that feeling of being trapped.  Did EMDR set on being on bridges during last trip, suds level 8, negative cognition "I am trapped" felt anxiety and abandonment in her chest and muscles.  Patient was able to reduce suds level to 6.  Patient shared she was feeling better but still recognizing that she was not to the place where she needed to to be able to go on airplanes or other activities that she does not feel in control of.  Encouraged patient to allow processing to continue between weeks and agreed to continue working on it at next session.  Interventions: Solution-Oriented/Positive Psychology, Eye Movement Desensitization and  Reprocessing (EMDR), and Insight-Oriented  Diagnosis:   ICD-10-CM   1. Generalized anxiety disorder  F41.1       Plan: Patient is to use CBT and coping skills to decrease anxiety symptoms.  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  Patient is to continue working on her CBT filters when interacting with her mother.  Patient is to take medication as directed. Long-term goal: Resolve the core conflict that is the source of anxiety-decrease rumination by 50% Short-term goal: Identify the major life complex from the past and present that form the basis for present anxiety Lina Sayre, [facility]

## 2021-09-20 ENCOUNTER — Encounter: Payer: Self-pay | Admitting: Physical Medicine and Rehabilitation

## 2021-09-20 DIAGNOSIS — M5416 Radiculopathy, lumbar region: Secondary | ICD-10-CM

## 2021-09-21 ENCOUNTER — Telehealth: Payer: Self-pay | Admitting: Physical Medicine and Rehabilitation

## 2021-09-21 NOTE — Telephone Encounter (Signed)
Pt returned call to [facility] to set an appt for injection. Please call pt at [phone].

## 2021-09-22 NOTE — Telephone Encounter (Signed)
Referral placed and patient scheduled.

## 2021-10-05 ENCOUNTER — Ambulatory Visit: Payer: BC Managed Care – PPO | Admitting: Plastic Surgery

## 2021-10-05 ENCOUNTER — Other Ambulatory Visit: Payer: Self-pay

## 2021-10-05 ENCOUNTER — Encounter: Payer: Self-pay | Admitting: Plastic Surgery

## 2021-10-05 DIAGNOSIS — N649 Disorder of breast, unspecified: Secondary | ICD-10-CM | POA: Diagnosis not present

## 2021-10-05 NOTE — Progress Notes (Signed)
Patient ID: Danielle [name], female    DOB: 1981/05/26, 40 y.o.   MRN: [id number]   Chief Complaint  Patient presents with   consult    The patient is a 40 year old female here for evaluation of her breasts.  5 feet 1 inch tall and weighs 179.  She was referred by her GYN.  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  Her mammogram was September 2022.  She also had a biopsy and she thinks a clip was placed.  Her breasts look like they are D size cups.  Her past medical history is positive for asthma and headaches she is otherwise in good health.  Has a little bit of tenderness of the right breast from the biopsy sites but no concerning areas otherwise noted.   Review of Systems  Constitutional: Negative.   HENT: Negative.    Eyes: Negative.   Respiratory: Negative.    Cardiovascular: Negative.   Gastrointestinal: Negative.   Endocrine: Negative.   Genitourinary: Negative.   Musculoskeletal: Negative.   Skin: Negative.   Neurological: Negative.   Hematological: Negative.   Psychiatric/Behavioral: Negative.     Past Medical History:  Diagnosis Date   Asthma    Constipation    Generalized headaches    Other and unspecified ovarian cysts    Pulse irregularity    Rapid heart rate    Seasonal allergies     Past Surgical History:  Procedure Laterality Date   DILATION AND CURETTAGE OF UTERUS  2005      Current Outpatient Medications:    albuterol (VENTOLIN HFA) 108 (90 Base) MCG/ACT inhaler, Inhale 2 puffs into the lungs every 6 (six) hours as needed. For wheeze or shortness of breath, Disp: 8.5 g, Rfl: 2   amitriptyline (ELAVIL) 10 MG tablet, TAKE 1 TABLET BY MOUTH EVERYDAY AT BEDTIME, Disp: 90 tablet, Rfl: 1   cetirizine (ZYRTEC) 10 MG tablet, Take 10 mg by mouth daily as needed. , Disp: , Rfl:    Multiple Vitamin (MULTIVITAMIN) tablet, Take 1 tablet by mouth daily., Disp: , Rfl:    pantoprazole (PROTONIX) 40 MG tablet,  TAKE 1 TABLET BY MOUTH EVERY DAY, Disp: 90 tablet, Rfl: 2   baclofen (LIORESAL) 10 MG tablet, Take 0.5-1 tablets (5-10 mg total) by mouth 3 (three) times daily as needed for muscle spasms. (Patient not taking: Reported on 09/03/2021), Disp: 30 each, Rfl: 3   clonazePAM (KLONOPIN) 0.5 MG tablet, Take 1 tablet (0.5 mg total) by mouth 2 (two) times daily as needed for anxiety., Disp: 60 tablet, Rfl: 2   diazepam (VALIUM) 5 MG tablet, Take 1 by mouth 1 hour  pre-procedure with very light food. May bring 2nd tablet to appointment. (Patient taking differently: Take 1 by mouth 1 hour  pre-procedure with very light food. May bring 2nd tablet to appointment.), Disp: 2 tablet, Rfl: 0   gabapentin (NEURONTIN) 100 MG capsule, 1 PO q HS, may increase to 1 PO TID if needed (Patient not taking: Reported on 09/03/2021), Disp: 90 capsule, Rfl: 3   Objective:   Vitals:   10/05/21 1013  BP: 125/89  Pulse: (!) 119  SpO2: 98%    Physical Exam Vitals and nursing note reviewed.  Constitutional:      Appearance: Normal appearance.  HENT:     Head: Normocephalic and atraumatic.  Cardiovascular:     Rate and Rhythm: Normal rate.     Pulses: Normal pulses.  Pulmonary:  Effort: Pulmonary effort is normal. No respiratory distress.  Abdominal:     General: There is no distension.     Palpations: Abdomen is soft.  Musculoskeletal:        General: No swelling or deformity.  Skin:    Capillary Refill: Capillary refill takes less than 2 seconds.     Coloration: Skin is not jaundiced.     Findings: No bruising or lesion.  Neurological:     Mental Status: She is alert and oriented to person, place, and time.  Psychiatric:        Mood and Affect: Mood normal.        Behavior: Behavior normal.        Thought Content: Thought content normal.        Judgment: Judgment normal.    Assessment & Plan:  Breast disease  We talked about some options for surgery from a plastic surgery standpoint however until you  know what her treatment for a little bit limited honing in on the plastic surgery part.  Patient has been referred to [facility].  We will set up a telemetry visit with [place] to discuss after she sees.  She understands that #1 surgery might not be needed and #2 a simple lumpectomy may be all she needs.  We will talk to her in about a month.  [place], DO

## 2021-10-07 ENCOUNTER — Encounter: Payer: Self-pay | Admitting: Physical Medicine and Rehabilitation

## 2021-10-07 ENCOUNTER — Ambulatory Visit (INDEPENDENT_AMBULATORY_CARE_PROVIDER_SITE_OTHER): Payer: BC Managed Care – PPO | Admitting: Physical Medicine and Rehabilitation

## 2021-10-07 ENCOUNTER — Other Ambulatory Visit: Payer: Self-pay

## 2021-10-07 ENCOUNTER — Ambulatory Visit: Payer: Self-pay

## 2021-10-07 VITALS — BP 114/80 | HR 98

## 2021-10-07 DIAGNOSIS — M5116 Intervertebral disc disorders with radiculopathy, lumbar region: Secondary | ICD-10-CM

## 2021-10-07 DIAGNOSIS — M5416 Radiculopathy, lumbar region: Secondary | ICD-10-CM | POA: Diagnosis not present

## 2021-10-07 MED ORDER — METHYLPREDNISOLONE ACETATE 80 MG/ML IJ SUSP
80.0000 mg | Freq: Once | INTRAMUSCULAR | Status: AC
  Administered 2021-10-07: 80 mg

## 2021-10-07 NOTE — Progress Notes (Signed)
Danielle [name] - 40 y.o. female MRN [id number]  Date of birth: 1981/10/03  Office Visit Note: Visit Date: 10/07/2021 PCP: Martinique, Betty G, MD Referred by: Martinique, Betty G, MD  Subjective: Chief Complaint  Patient presents with   Lower Back - Pain   Left Hip - Pain   HPI:  Danielle [name] is a 40 y.o. female who comes in today For repeat left S1 transforaminal epidural steroid injection.  Patient did extremely well with almost 95% relief with prior injection in August.  She got about 2 months almost relief and has started having return of symptoms over the last several weeks.  She still doing much better than she did initially when we first saw her.  She came to [place] as a referral from Dr. Eunice Blase.  She been a physical therapy in our office and has been seeing Scot Jun, PT.  She really has not been on much medication after the injection because she was doing so well.  She has had no focal weakness no new symptoms other than posterior leg pain.  No right-sided complaints.  Prior MRI with disc protrusion at L5-S1 likely irritating the S1 nerve root.  Diagnostic injection proved source of pain.  We did have a talk today about home exercises even though she has been through physical therapy.  I had her look up McKenzie exercises and neural flossing.  ROS Otherwise per HPI.  Assessment & Plan: Visit Diagnoses:    ICD-10-CM   1. Lumbar radiculopathy  M54.16 XR C-ARM NO REPORT    Epidural Steroid injection    methylPREDNISolone acetate (DEPO-MEDROL) injection 80 mg    2. Radiculopathy due to lumbar intervertebral disc disorder  M51.16       Plan: No additional findings.   Meds & Orders:  Meds ordered this encounter  Medications   methylPREDNISolone acetate (DEPO-MEDROL) injection 80 mg    Orders Placed This Encounter  Procedures   XR C-ARM NO REPORT   Epidural Steroid injection    Follow-up: Return if symptoms worsen or fail to improve.   Procedures: No  procedures performed  S1 Lumbosacral Transforaminal Epidural Steroid Injection - Sub-Pedicular Approach with Fluoroscopic Guidance   Patient: Danielle [name]      Date of Birth: Apr 18, 1981 MRN: [id number] PCP: Martinique, Betty G, MD      Visit Date: 10/07/2021   Universal Protocol:    Date/Time: 10/07/2210:35 AM  Consent Given By: the patient  Position:  PRONE  Additional Comments: Vital signs were monitored before and after the procedure. Patient was prepped and draped in the usual sterile fashion. The correct patient, procedure, and site was verified.   Injection Procedure Details:  Procedure Site One Meds Administered:  Meds ordered this encounter  Medications   methylPREDNISolone acetate (DEPO-MEDROL) injection 80 mg    Laterality: Left  Location/Site:  S1 Foramen   Needle size: 22 ga.  Needle type: Spinal  Needle Placement: Transforaminal  Findings:   -Comments: Excellent flow of contrast along the nerve, nerve root and into the epidural space.  Epidurogram: Contrast epidurogram showed no nerve root cut off or restricted flow pattern.  Procedure Details: After squaring off the sacral end-plate to get a true AP view, the C-arm was positioned so that the best possible view of the S1 foramen was visualized. The soft tissues overlying this structure were infiltrated with 2-3 ml. of 1% Lidocaine without Epinephrine.    The spinal needle was inserted toward the target using a "  trajectory" view along the fluoroscope beam.  Under AP and lateral visualization, the needle was advanced so it did not puncture dura. Biplanar projections were used to confirm position. Aspiration was confirmed to be negative for CSF and/or blood. A 1-2 ml. volume of Isovue-250 was injected and flow of contrast was noted at each level. Radiographs were obtained for documentation purposes.   After attaining the desired flow of contrast documented above, a 0.5 to 1.0 ml test dose of 0.25%  Marcaine was injected into each respective transforaminal space.  The patient was observed for 90 seconds post injection.  After no sensory deficits were reported, and normal lower extremity motor function was noted,   the above injectate was administered so that equal amounts of the injectate were placed at each foramen (level) into the transforaminal epidural space.   Additional Comments:   With needling down in the squared off AP view patient did have quick but mild paresthesia or dysesthesia.  This was in an S1 dermatome.  Lateral was obtained showing good position.  At that point patient was starting to have a vasovagal type reaction feeling nauseated and hot.  She does have a history of vasovagal responses.  Our initial time that we saw her we gave her Valium preprocedure but this time she took some home Klonopin.  She does suffer from generalized anxiety disorder.  We were able to finish the injection without difficulty.  Flow of contrast looked excellent.  She did have some tingling type paresthesias in the foot in an S1 dermatome really bottom of the foot to the lower calf.  She was able to ambulate on her own and I did perform strength test and she had good strength with plantarflexion on the left.  The mild tingling paresthesias should resolve.  Dressing: Band-Aid with 2 x 2 sterile gauze    Post-procedure details: Patient was observed during the procedure. Post-procedure instructions were reviewed.  Patient left the clinic in stable condition.   Clinical History: MRI LUMBAR SPINE WITHOUT CONTRAST   TECHNIQUE: Multiplanar, multisequence MR imaging of the lumbar spine was performed. No intravenous contrast was administered.   COMPARISON:  None.   FINDINGS: Segmentation:  Standard.   Alignment:  Physiologic.   Vertebrae:  No fracture, evidence of discitis, or bone lesion.   Conus medullaris and cauda equina: Conus extends to the L2 level. Conus and cauda equina appear  normal.   Paraspinal and other soft tissues: Choose   Disc levels:   T12-L1: Normal.   L1-L2: Normal disc space and facet joints. No spinal canal stenosis. No neural foraminal stenosis.   L2-L3: Normal disc space and facet joints. No spinal canal stenosis. No neural foraminal stenosis.   L3-L4: Normal disc space and facet joints. No spinal canal stenosis. No neural foraminal stenosis.   L4-L5: Small right foraminal disc protrusion close proximity to the exiting right L4 nerve root. No spinal canal stenosis. No neural foraminal stenosis.   L5-S1: Small left subarticular disc extrusion with minimal inferior migration. Left lateral recess narrowing without central spinal canal stenosis. No neural foraminal stenosis.   Visualized sacrum: Normal.   IMPRESSION: 1. L5-S1 small left subarticular disc extrusion with minimal inferior migration, narrowing the left lateral recess, a potential source of left S1 radiculopathy. 2. Small right foraminal disc protrusion at L4-L5 close proximity to the exiting right L4 nerve root, a potential source of right L4 radiculopathy.     Electronically Signed   By: Cletus Gash.D.  On: 06/24/2021 16:22     Objective:  VS:  HT:    WT:   BMI:     BP:114/80  HR:98bpm  TEMP: ( )  RESP:  Physical Exam Vitals and nursing note reviewed.  Constitutional:      General: She is not in acute distress.    Appearance: Normal appearance. She is not ill-appearing.  HENT:     Head: Normocephalic and atraumatic.     Right Ear: External ear normal.     Left Ear: External ear normal.  Eyes:     Extraocular Movements: Extraocular movements intact.  Cardiovascular:     Rate and Rhythm: Normal rate.     Pulses: Normal pulses.  Pulmonary:     Effort: Pulmonary effort is normal. No respiratory distress.  Abdominal:     General: There is no distension.     Palpations: Abdomen is soft.  Musculoskeletal:        General: Tenderness present.      Cervical back: Neck supple.     Right lower leg: No edema.     Left lower leg: No edema.     Comments: Patient has good distal strength with no pain over the greater trochanters.  No clonus or focal weakness.  Skin:    Findings: No erythema, lesion or rash.  Neurological:     General: No focal deficit present.     Mental Status: She is alert and oriented to person, place, and time.     Sensory: No sensory deficit.     Motor: No weakness or abnormal muscle tone.     Coordination: Coordination normal.  Psychiatric:        Mood and Affect: Mood normal.        Behavior: Behavior normal.     Imaging: XR C-ARM NO REPORT  Result Date: 10/07/2021 Please see Notes tab for imaging impression.

## 2021-10-07 NOTE — Procedures (Signed)
S1 Lumbosacral Transforaminal Epidural Steroid Injection - Sub-Pedicular Approach with Fluoroscopic Guidance   Patient: Danielle [name]      Date of Birth: 15-Jan-1981 MRN: [id number] PCP: Martinique, Betty G, MD      Visit Date: 10/07/2021   Universal Protocol:    Date/Time: 10/07/2210:35 AM  Consent Given By: the patient  Position:  PRONE  Additional Comments: Vital signs were monitored before and after the procedure. Patient was prepped and draped in the usual sterile fashion. The correct patient, procedure, and site was verified.   Injection Procedure Details:  Procedure Site One Meds Administered:  Meds ordered this encounter  Medications   methylPREDNISolone acetate (DEPO-MEDROL) injection 80 mg    Laterality: Left  Location/Site:  S1 Foramen   Needle size: 22 ga.  Needle type: Spinal  Needle Placement: Transforaminal  Findings:   -Comments: Excellent flow of contrast along the nerve, nerve root and into the epidural space.  Epidurogram: Contrast epidurogram showed no nerve root cut off or restricted flow pattern.  Procedure Details: After squaring off the sacral end-plate to get a true AP view, the C-arm was positioned so that the best possible view of the S1 foramen was visualized. The soft tissues overlying this structure were infiltrated with 2-3 ml. of 1% Lidocaine without Epinephrine.    The spinal needle was inserted toward the target using a "trajectory" view along the fluoroscope beam.  Under AP and lateral visualization, the needle was advanced so it did not puncture dura. Biplanar projections were used to confirm position. Aspiration was confirmed to be negative for CSF and/or blood. A 1-2 ml. volume of Isovue-250 was injected and flow of contrast was noted at each level. Radiographs were obtained for documentation purposes.   After attaining the desired flow of contrast documented above, a 0.5 to 1.0 ml test dose of 0.25% Marcaine was injected into  each respective transforaminal space.  The patient was observed for 90 seconds post injection.  After no sensory deficits were reported, and normal lower extremity motor function was noted,   the above injectate was administered so that equal amounts of the injectate were placed at each foramen (level) into the transforaminal epidural space.   Additional Comments:   With needling down in the squared off AP view patient did have quick but mild paresthesia or dysesthesia.  This was in an S1 dermatome.  Lateral was obtained showing good position.  At that point patient was starting to have a vasovagal type reaction feeling nauseated and hot.  She does have a history of vasovagal responses.  Our initial time that we saw her we gave her Valium preprocedure but this time she took some home Klonopin.  She does suffer from generalized anxiety disorder.  We were able to finish the injection without difficulty.  Flow of contrast looked excellent.  She did have some tingling type paresthesias in the foot in an S1 dermatome really bottom of the foot to the lower calf.  She was able to ambulate on her own and I did perform strength test and she had good strength with plantarflexion on the left.  The mild tingling paresthesias should resolve.  Dressing: Band-Aid with 2 x 2 sterile gauze    Post-procedure details: Patient was observed during the procedure. Post-procedure instructions were reviewed.  Patient left the clinic in stable condition.

## 2021-10-07 NOTE — Patient Instructions (Signed)

## 2021-10-07 NOTE — Progress Notes (Signed)
Pt state lower back pain that travels down her left buttock and hip. Pt state sitting, bending and twisting makes the pain worse. Pt state she takes over the counter pain meds to help ease her pain.  Numeric Pain Rating Scale and Functional Assessment Average Pain 6   In the last MONTH (on 0-10 scale) has pain interfered with the following?  1. General activity like being  able to carry out your everyday physical activities such as walking, climbing stairs, carrying groceries, or moving a chair?  Rating(9)   +Driver, -BT, -Dye Allergies.

## 2021-10-12 ENCOUNTER — Ambulatory Visit: Payer: BC Managed Care – PPO | Admitting: Psychiatry

## 2021-10-17 ENCOUNTER — Other Ambulatory Visit: Payer: Self-pay | Admitting: Family Medicine

## 2021-10-17 DIAGNOSIS — R519 Headache, unspecified: Secondary | ICD-10-CM

## 2021-10-18 ENCOUNTER — Ambulatory Visit: Payer: Self-pay | Admitting: Surgery

## 2021-10-18 DIAGNOSIS — D241 Benign neoplasm of right breast: Secondary | ICD-10-CM | POA: Diagnosis not present

## 2021-10-18 NOTE — H&P (Signed)
Subjective   Chief Complaint: Breast Mass     History of Present Illness: Danielle [name] is a 40 y.o. female who is seen today as an office consultation at the request of Dr. Julien Girt for evaluation of Breast Mass .   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  Further evaluation with diagnostic mammogram and ultrasound showed a 4 mm mass in the right upper outer quadrant just outside the areola.  There was a second adjacent mass.  Both of these were biopsied under stereotactic guidance.  The upper outer central mass marked with an X shaped clip showed intraductal papilloma with usual ductal hyperplasia.  The upper central mass marked with a coil clip showed fibrocystic changes with apocrine metaplasia.  Unfortunately, the X shaped clip seems to have migrated 2.5 cm posterior inferior.  The patient presents today for consultation regarding excision.  The patient denies any previous breast problems.  She denies any symptoms prior to her mammogram.  She is very anxious about her diagnosis of intraductal papilloma.  The patient is undergoing treatment for some lumbar disc problems causing intermittent radiculopathy.  She is trying to avoid surgery.  Review of Systems: A complete review of systems was obtained from the patient.  I have reviewed this information and discussed as appropriate with the patient.  See HPI as well for other ROS.  Review of Systems  Constitutional: Negative.   HENT: Negative.   Eyes: Negative.   Respiratory: Negative.   Cardiovascular: Negative.   Gastrointestinal: Negative.   Genitourinary: Negative.   Musculoskeletal: Positive for back pain and myalgias.  Skin: Negative.   Neurological: Negative.   Endo/Heme/Allergies: Negative.   Psychiatric/Behavioral: Negative.       Medical History: Past Medical History:  Diagnosis Date   Anxiety     Arrhythmia    Asthma, unspecified asthma severity, unspecified whether complicated, unspecified whether persistent    GERD (gastroesophageal reflux disease)     Patient Active Problem List  Diagnosis   Agoraphobia with panic attacks   Asthma with exacerbation   Generalized anxiety disorder    History reviewed. No pertinent surgical history.   Allergies  Allergen Reactions   Doxycycline Nausea And Vomiting    Other reaction(s): Vomiting    Current Outpatient Medications on File Prior to Visit  Medication Sig Dispense Refill   albuterol 90 mcg/actuation inhaler INHALE 2 PUFFS INTO THE LUNGS EVERY 6 (SIX) HOURS AS NEEDED. FOR WHEEZE OR SHORTNESS OF BREATH     amitriptyline (ELAVIL) 10 MG tablet Take 1 tablet (10 mg total) by mouth at bedtime     clonazePAM (KLONOPIN) 0.5 MG tablet Take 1 tablet (0.5 mg total) by mouth 2 (two) times daily as needed     pantoprazole (PROTONIX) 40 MG DR tablet Take 1 tablet (40 mg total) by mouth once daily     No current facility-administered medications on file prior to visit.    Family History  Problem Relation Age of Onset   High blood pressure (Hypertension) Mother    Hyperlipidemia (Elevated cholesterol) Mother    High blood pressure (Hypertension) Father    Hyperlipidemia (Elevated cholesterol) Father      Social History   Tobacco Use  Smoking Status Never  Smokeless Tobacco Never     Social History   Socioeconomic History   Marital status: Married  Tobacco Use   Smoking status: Never  Smokeless tobacco: Never  Substance and Sexual Activity   Alcohol use: Yes    Comment: 2/3 drinks per month   Drug use: Never    Objective:    Vitals:   10/18/21 1103  BP: 126/80  Pulse: (!) 132  Temp: 37.1 C (98.7 F)  SpO2: 98%  Weight: 83.3 kg (183 lb 9.6 oz)  Height: 154.9 cm (5\' 1" )    Body mass index is 34.69 kg/m.  Physical Exam   Constitutional:  WDWN in NAD, conversant, no obvious deformities; lying in bed  comfortably Eyes:  Pupils equal, round; sclera anicteric; moist conjunctiva; no lid lag HENT:  Oral mucosa moist; good dentition  Neck:  No masses palpated, trachea midline; no thyromegaly Lungs:  CTA bilaterally; normal respiratory effort Breasts:  symmetric, no nipple changes; no palpable masses or lymphadenopathy on either side CV:  Regular rate and rhythm; no murmurs; extremities well-perfused with no edema Abd:  +bowel sounds, soft, non-tender, no palpable organomegaly; no palpable hernias Musc:  Unable to assess gait; no apparent clubbing or cyanosis in extremities Lymphatic:  No palpable cervical or axillary lymphadenopathy Skin:  Warm, dry; no sign of jaundice Psychiatric - alert and oriented x 4; calm mood and affect   Labs, Imaging and Diagnostic Testing: CLINICAL DATA:  40 year old female recalled from screening mammogram dated 08/20/2021 for possible right breast masses.   EXAM: DIGITAL DIAGNOSTIC UNILATERAL RIGHT MAMMOGRAM WITH TOMOSYNTHESIS AND CAD; ULTRASOUND RIGHT BREAST LIMITED   TECHNIQUE: Right digital diagnostic mammography and breast tomosynthesis was performed. The images were evaluated with computer-aided detection.; Targeted ultrasound examination of the right breast was performed   COMPARISON:  Previous exam(s).   ACR Breast Density Category b: There are scattered areas of fibroglandular density.   FINDINGS: There are 2 persistent oval, circumscribed equal density masses in the deep central right breast. Further evaluation with ultrasound was performed.   Targeted ultrasound is performed, showing an oval, circumscribed hypoechoic mass at the 1 o'clock position 2 cm from the nipple. It measures 4 x 4 x 2 mm. There is no associated vascularity. This is felt not to correspond with the deeper masses identified mammographically. Additional fibrocystic changes are seen deep along the 12 and 11 o'clock axis. These may correspond with the mammographic  finding.   IMPRESSION: Probably benign right breast masses which likely correspond with fibrocystic changes identified sonographically. The option for short-term follow-up was discussed with the patient. However, she prefers definitive tissue diagnosis and would like to proceed with biopsies of both areas.   RECOMMENDATION: Two area stereotactic biopsy of the right breast.   I have discussed the findings and recommendations with the patient. If applicable, a reminder letter will be sent to the patient regarding the next appointment.   BI-RADS CATEGORY  3: Probably benign.     Electronically Signed   By: Kristopher Oppenheim M.D.   On: 08/31/2021 16:26   CLINICAL DATA:  Evaluate placement of X and COIL biopsy marker clip clips following 3D/stereotactic guided RIGHT breast biopsies.   EXAM: 3D DIAGNOSTIC RIGHT MAMMOGRAM POST STEREOTACTIC BIOPSY   COMPARISON:  Previous exam(s).   FINDINGS: 3D Mammographic images were obtained following stereotactic guided biopsy of the 0.4 cm UPPER-OUTER central RIGHT breast mass (X clip) in the 0.5 cm UPPER central RIGHT breast mass (COIL clip).   The X biopsy marking clip is felt to have migrated 2.5 cm posteroinferior to the biopsy cavity.   The COIL biopsy marking clip is in satisfactory position at the site  of biopsy within the UPPER central RIGHT breast.   IMPRESSION: 2.5 cm posteroinferior migration of the X biopsy marking clip from the site of biopsy of the 0.4 cm UPPER-OUTER central RIGHT breast mass.   Appropriate positioning of the COIL shaped biopsy marking clip at the site of biopsy in the UPPER central RIGHT breast.   Final Assessment: Post Procedure Mammograms for Marker Placement     Electronically Signed   By: Margarette Canada M.D.   On: 09/08/2021 09:06  Assessment and Plan:  Diagnoses and all orders for this visit:  Intraductal papilloma of breast, right     After a lengthy discussion and answering her multiple  questions, we have decided to proceed with excision of this area.  Localization will be slightly complicated by the migration of the biopsy marker but we will attempt radioactive seed localization of the mass.  Right radioactive seed localized lumpectomy.The surgical procedure has been discussed with the patient.  Potential risks, benefits, alternative treatments, and expected outcomes have been explained.  All of the patient's questions at this time have been answered.  The likelihood of reaching the patient's treatment goal is good.  The patient understand the proposed surgical procedure and wishes to proceed.   Carlean Jews, MD  10/18/2021 3:50 PM

## 2021-10-21 ENCOUNTER — Other Ambulatory Visit: Payer: Self-pay | Admitting: Surgery

## 2021-10-21 DIAGNOSIS — D241 Benign neoplasm of right breast: Secondary | ICD-10-CM

## 2021-10-25 ENCOUNTER — Ambulatory Visit: Payer: BC Managed Care – PPO | Admitting: Psychiatry

## 2021-11-01 ENCOUNTER — Encounter: Payer: Self-pay | Admitting: Physical Medicine and Rehabilitation

## 2021-11-08 ENCOUNTER — Ambulatory Visit (INDEPENDENT_AMBULATORY_CARE_PROVIDER_SITE_OTHER): Payer: BC Managed Care – PPO | Admitting: Family Medicine

## 2021-11-08 ENCOUNTER — Encounter: Payer: Self-pay | Admitting: Family Medicine

## 2021-11-08 VITALS — BP 118/80 | HR 100 | Temp 98.3°F | Resp 16 | Ht 61.0 in | Wt 179.8 lb

## 2021-11-08 DIAGNOSIS — R519 Headache, unspecified: Secondary | ICD-10-CM | POA: Insufficient documentation

## 2021-11-08 DIAGNOSIS — Z1329 Encounter for screening for other suspected endocrine disorder: Secondary | ICD-10-CM | POA: Diagnosis not present

## 2021-11-08 DIAGNOSIS — L301 Dyshidrosis [pompholyx]: Secondary | ICD-10-CM

## 2021-11-08 DIAGNOSIS — Z0000 Encounter for general adult medical examination without abnormal findings: Secondary | ICD-10-CM

## 2021-11-08 DIAGNOSIS — Z13228 Encounter for screening for other metabolic disorders: Secondary | ICD-10-CM

## 2021-11-08 DIAGNOSIS — M5416 Radiculopathy, lumbar region: Secondary | ICD-10-CM

## 2021-11-08 DIAGNOSIS — Z130 Encounter for screening for diseases of the blood and blood-forming organs and certain disorders involving the immune mechanism: Secondary | ICD-10-CM | POA: Diagnosis not present

## 2021-11-08 DIAGNOSIS — Z23 Encounter for immunization: Secondary | ICD-10-CM | POA: Diagnosis not present

## 2021-11-08 DIAGNOSIS — M7711 Lateral epicondylitis, right elbow: Secondary | ICD-10-CM

## 2021-11-08 DIAGNOSIS — Z13220 Encounter for screening for lipoid disorders: Secondary | ICD-10-CM

## 2021-11-08 DIAGNOSIS — Z111 Encounter for screening for respiratory tuberculosis: Secondary | ICD-10-CM | POA: Diagnosis not present

## 2021-11-08 DIAGNOSIS — F39 Unspecified mood [affective] disorder: Secondary | ICD-10-CM | POA: Diagnosis not present

## 2021-11-08 DIAGNOSIS — J4520 Mild intermittent asthma, uncomplicated: Secondary | ICD-10-CM

## 2021-11-08 LAB — COMPREHENSIVE METABOLIC PANEL
ALT: 14 U/L (ref 0–35)
AST: 17 U/L (ref 0–37)
Albumin: 4.3 g/dL (ref 3.5–5.2)
Alkaline Phosphatase: 69 U/L (ref 39–117)
BUN: 7 mg/dL (ref 6–23)
CO2: 24 mEq/L (ref 19–32)
Calcium: 9.6 mg/dL (ref 8.4–10.5)
Chloride: 106 mEq/L (ref 96–112)
Creatinine, Ser: 0.65 mg/dL (ref 0.40–1.20)
GFR: 108.92 mL/min (ref >60.00)
Glucose, Bld: 93 mg/dL (ref 70–99)
Potassium: 3.8 mEq/L (ref 3.5–5.1)
Sodium: 140 mEq/L (ref 135–145)
Total Bilirubin: 0.5 mg/dL (ref 0.2–1.2)
Total Protein: 7.7 g/dL (ref 6.0–8.3)

## 2021-11-08 LAB — LIPID PANEL
Cholesterol: 203 mg/dL — ABNORMAL HIGH (ref 0–200)
HDL: 43.6 mg/dL (ref >39.00)
LDL Cholesterol: 126 mg/dL — ABNORMAL HIGH (ref 0–99)
NonHDL: 159.38
Total CHOL/HDL Ratio: 5
Triglycerides: 169 mg/dL — ABNORMAL HIGH (ref 0.0–149.0)
VLDL: 33.8 mg/dL (ref 0.0–40.0)

## 2021-11-08 MED ORDER — PREGABALIN 25 MG PO CAPS
25.0000 mg | ORAL_CAPSULE | Freq: Two times a day (BID) | ORAL | 0 refills | Status: DC
Start: 2021-11-08 — End: 2022-01-17

## 2021-11-08 MED ORDER — CLOBETASOL PROPIONATE 0.05 % EX CREA: 1.0000 "application " | TOPICAL_CREAM | Freq: Two times a day (BID) | CUTANEOUS | 1 refills | Status: DC | PRN

## 2021-11-08 MED ORDER — ALBUTEROL SULFATE HFA 108 (90 BASE) MCG/ACT IN AERS: 2.0000 | INHALATION_SPRAY | Freq: Four times a day (QID) | RESPIRATORY_TRACT | 2 refills | Status: DC | PRN

## 2021-11-08 NOTE — Assessment & Plan Note (Signed)
We discussed Dx and prognosis. Clobetasol has helped, so continue small amount on affected area bid prn.

## 2021-11-08 NOTE — Assessment & Plan Note (Signed)
Problem has improved with Amitriptyline 10 mg at bedtime, so no changes.

## 2021-11-08 NOTE — Assessment & Plan Note (Signed)
Problem is well controlled. Continue Albuterol inh 2 puff q 4-6 hours.

## 2021-11-08 NOTE — Patient Instructions (Addendum)
A few things to remember from today's visit:  Routine general medical examination at a health care facility  Screening for lipoid disorders - Plan: Lipid panel  Screening for endocrine, metabolic and immunity disorder - Plan: Comprehensive metabolic panel  Need for tuberculosis vaccination - Plan: PPD  Lumbar radiculopathy - Plan: pregabalin (LYRICA) 25 MG capsule  Dyshidrotic eczema - Plan: clobetasol cream (TEMOVATE) 0.05 %  Do not use My Chart to request refills or for acute issues that need immediate attention.   Lyrica 25 mg at night and can increase to 2 times daily in 7 days if well tolerated. Let me know in 3 weeks if medication is helping. If you want to continue I will see you back in 3 months.  Please be sure medication list is accurate. If a new problem present, please set up appointment sooner than planned today.  Preventive Care 42-42 Years Old, Female Preventive care refers to lifestyle choices and visits with your health care provider that can promote health and wellness. Preventive care visits are also called wellness exams. What can I expect for my preventive care visit? Counseling Your health care provider may ask you questions about your: Medical history, including: Past medical problems. Family medical history. Pregnancy history. Current health, including: Menstrual cycle. Method of birth control. Emotional well-being. Home life and relationship well-being. Sexual activity and sexual health. Lifestyle, including: Alcohol, nicotine or tobacco, and drug use. Access to firearms. Diet, exercise, and sleep habits. Work and work Statistician. Sunscreen use. Safety issues such as seatbelt and bike helmet use. Physical exam Your health care provider will check your: Height and weight. These may be used to calculate your BMI (body mass index). BMI is a measurement that tells if you are at a healthy weight. Waist circumference. This measures the distance  around your waistline. This measurement also tells if you are at a healthy weight and may help predict your risk of certain diseases, such as type 2 diabetes and high blood pressure. Heart rate and blood pressure. Body temperature. Skin for abnormal spots. What immunizations do I need? Vaccines are usually given at various ages, according to a schedule. Your health care provider will recommend vaccines for you based on your age, medical history, and lifestyle or other factors, such as travel or where you work. What tests do I need? Screening Your health care provider may recommend screening tests for certain conditions. This may include: Lipid and cholesterol levels. Diabetes screening. This is done by checking your blood sugar (glucose) after you have not eaten for a while (fasting). Pelvic exam and Pap test. Hepatitis B test. Hepatitis C test. HIV (human immunodeficiency virus) test. STI (sexually transmitted infection) testing, if you are at risk. Lung cancer screening. Colorectal cancer screening. Mammogram. Talk with your health care provider about when you should start having regular mammograms. This may depend on whether you have a family history of breast cancer. BRCA-related cancer screening. This may be done if you have a family history of breast, ovarian, tubal, or peritoneal cancers. Bone density scan. This is done to screen for osteoporosis. Talk with your health care provider about your test results, treatment options, and if necessary, the need for more tests. Follow these instructions at home: Eating and drinking  Eat a diet that includes fresh fruits and vegetables, whole grains, lean protein, and low-fat dairy products. Take vitamin and mineral supplements as recommended by your health care provider. Do not drink alcohol if: Your health care provider tells you not  to drink. You are pregnant, may be pregnant, or are planning to become pregnant. If you drink  alcohol: Limit how much you have to 0-1 drink a day. Know how much alcohol is in your drink. In the [place] one drink equals one 12 oz bottle of beer (355 mL), one 5 oz glass of wine (148 mL), or one 1 oz glass of hard liquor (44 mL). Lifestyle Brush your teeth every morning and night with fluoride toothpaste. Floss one time each day. Exercise for at least 30 minutes 5 or more days each week. Do not use any products that contain nicotine or tobacco. These products include cigarettes, chewing tobacco, and vaping devices, such as e-cigarettes. If you need help quitting, ask your health care provider. Do not use drugs. If you are sexually active, practice safe sex. Use a condom or other form of protection to prevent STIs. If you do not wish to become pregnant, use a form of birth control. If you plan to become pregnant, see your health care provider for a prepregnancy visit. Take aspirin only as told by your health care provider. Make sure that you understand how much to take and what form to take. Work with your health care provider to find out whether it is safe and beneficial for you to take aspirin daily. Find healthy ways to manage stress, such as: Meditation, yoga, or listening to music. Journaling. Talking to a trusted person. Spending time with friends and family. Minimize exposure to UV radiation to reduce your risk of skin cancer. Safety Always wear your seat belt while driving or riding in a vehicle. Do not drive: If you have been drinking alcohol. Do not ride with someone who has been drinking. When you are tired or distracted. While texting. If you have been using any mind-altering substances or drugs. Wear a helmet and other protective equipment during sports activities. If you have firearms in your house, make sure you follow all gun safety procedures. Seek help if you have been physically or sexually abused. What's next? Visit your health care provider once a year for an  annual wellness visit. Ask your health care provider how often you should have your eyes and teeth checked. Stay up to date on all vaccines. This information is not intended to replace advice given to you by your health care provider. Make sure you discuss any questions you have with your health care provider. Document Revised: 05/26/2021 Document Reviewed: 05/26/2021 Elsevier Patient Education  [employer].

## 2021-11-08 NOTE — Progress Notes (Signed)
HPI: Danielle [name] is a 40 y.o. female, who is here today for her routine physical.  Last CPE: 10/28/20  Regular exercise 3 or more time per week: Walking some around her neighborhood, not consistently since 01/2021. Following a healthy diet: Yes, she does.Increased water intake and decreased sodas intake. Frustrated because not losing wt. She lives with her husband and 2 children. She works part time.   Chronic medical problems: Asthma,mood disorder,anxiety,lower back pain,and allergic rhinitis. Following with ortho for back pain. She had 2 epidural injections, the 2nd one has not helped.   Amitriptyline helped with headaches. Headaches are usually worse during this time of the year. Sleeping about 8 hours. Feels rested most of the time.  She is following with psychiatrist, she is on clonazepam. Meds for anxiety and depression medications she tried did not helped. She has been under a lot of stress. Dx'ed with right breast intraductal papilloma, planing on right breast lumpectomy.  Pap smear:2022. Last visit 04/2021. She sees Dr Julien Girt.  Immunization History  Administered Date(s) Administered   Influenza Inj Mdck Quad Pf 08/23/2019   Influenza, Seasonal, Injecte, Preservative Fre 08/20/2015   Influenza,inj,Quad PF,6+ Mos 07/29/2018, 08/11/2020, 08/13/2021   Influenza,inj,quad, With Preservative 08/30/2016   Influenza-Unspecified 08/31/2017, 07/31/2020, 08/13/2021   PPD Test 11/25/2016, 11/29/2017, 11/14/2018, 10/29/2019, 10/28/2020, 11/08/2021   Tdap 11/25/2016   Unspecified SARS-COV-2 Vaccination 09/03/2020, 08/18/2021   Health Maintenance  Topic Date Due   COVID-19 Vaccine (3 - Mixed Product risk series) 11/24/2021 (Originally 09/15/2021)   Pneumococcal Vaccine 84-36 Years old (1 - PCV) 11/08/2022 (Originally [birth year]/1985)   PAP SMEAR-Modifier  08/12/2024   TETANUS/TDAP  11/25/2026   INFLUENZA VACCINE  Completed   Hepatitis C Screening  Completed   HPV  VACCINES  Aged Out   HIV Screening  Discontinued   Lower back pain: It is not any better after second epidural injection. After the first one pain greatly improved for 6 weeks. Following with Dr Lucia Gaskins. Pain is radiated to LLE, pain can be severe, interferes with sleep. Negative for saddle anesthesia,bladder/bowel dysfunction. PT did not help.  Refills on Clobetasol, which has been prescribed in the past for pruritic rash on fingers and toes. Medication helps. She has not identified exacerbating or alleviating factors.  Right elbow pain after hit it againt something earlier thi year. No deformity, edema,or erythema.  No limitation of ROM. Exacerbated by certain movements and palpation.  She needs TB screening test done for work.  Asthma: She is on Albuterol inh, which she does not need very frequent.  Review of Systems  Constitutional:  Positive for fatigue. Negative for appetite change and fever.  HENT:  Negative for hearing loss, mouth sores, sore throat and trouble swallowing.   Eyes:  Negative for redness and visual disturbance.  Respiratory:  Negative for cough, shortness of breath and wheezing.   Cardiovascular:  Negative for chest pain, palpitations and leg swelling.  Gastrointestinal:  Negative for abdominal pain, nausea and vomiting.       No changes in bowel habits.  Endocrine: Negative for cold intolerance, heat intolerance, polydipsia, polyphagia and polyuria.  Genitourinary:  Negative for decreased urine volume, dysuria, hematuria, vaginal bleeding and vaginal discharge.  Musculoskeletal:  Positive for back pain. Negative for gait problem and myalgias.  Skin:  Negative for color change and rash.  Allergic/Immunologic: Positive for environmental allergies.  Neurological:  Positive for headaches. Negative for syncope and weakness.  Hematological:  Negative for adenopathy. Does not bruise/bleed easily.  Psychiatric/Behavioral:  Positive for sleep disturbance. Negative  for confusion and hallucinations. The patient is nervous/anxious.   All other systems reviewed and are negative.  Current Outpatient Medications on File Prior to Visit  Medication Sig Dispense Refill   amitriptyline (ELAVIL) 10 MG tablet TAKE 1 TABLET BY MOUTH EVERYDAY AT BEDTIME 90 tablet 1   cetirizine (ZYRTEC) 10 MG tablet Take 10 mg by mouth daily as needed.      Multiple Vitamin (MULTIVITAMIN) tablet Take 1 tablet by mouth daily.     pantoprazole (PROTONIX) 40 MG tablet TAKE 1 TABLET BY MOUTH EVERY DAY 90 tablet 2   clonazePAM (KLONOPIN) 0.5 MG tablet Take 1 tablet (0.5 mg total) by mouth 2 (two) times daily as needed for anxiety. 60 tablet 2   No current facility-administered medications on file prior to visit.   Past Medical History:  Diagnosis Date   Asthma    Constipation    Generalized headaches    Other and unspecified ovarian cysts    Pulse irregularity    Rapid heart rate    Seasonal allergies    Past Surgical History:  Procedure Laterality Date   DILATION AND CURETTAGE OF UTERUS  2005   Allergies  Allergen Reactions   Doxycycline Nausea And Vomiting    Other reaction(s): Vomiting   Family History  Problem Relation Age of Onset   Hypertension Mother    Hypertension Father    Anxiety disorder Father    Anxiety disorder Brother    Depression Brother    ADD / ADHD Son    Tourette syndrome Son    Depression Daughter    CAD Neg Hx    Social History   Socioeconomic History   Marital status: Married    Spouse name: Not on file   Number of children: 2   Years of education: Not on file   Highest education level: Not on file  Occupational History   Occupation: Therapist, 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  Tobacco Use   Smoking status: Never   Smokeless tobacco: Never  Vaping Use   Vaping Use: Never used  Substance and Sexual Activity   Alcohol use: Yes    Alcohol/week: 1.0 standard drink    Types: 1 Standard drinks or equivalent per week    Comment: occasionally   Drug use: No    Sexual activity: Not on file  Other Topics Concern   Not on file  Social History Narrative   Not on file   Social Determinants of Health   Financial Resource Strain: Not on file  Food Insecurity: Not on file  Transportation Needs: Not on file  Physical Activity: Not on file  Stress: Not on file  Social Connections: Not on file   Vitals:   11/08/21 0951  BP: 118/80  Pulse: 100  Resp: 16  Temp: 98.3 F (36.8 C)  SpO2: 98%   Body mass index is 33.97 kg/m.  Wt Readings from Last 3 Encounters:  11/08/21 179 lb 12.8 oz (81.6 kg)  10/05/21 179 lb (81.2 kg)  07/05/21 178 lb 8 oz (81 kg)   Physical Exam Vitals and nursing note reviewed.  Constitutional:      General: She is not in acute distress.    Appearance: She is well-developed.  HENT:     Head: Normocephalic and atraumatic.     Right Ear: Hearing, tympanic membrane, ear canal and external ear normal.     Left Ear: Hearing, tympanic membrane, ear canal and external ear normal.  Mouth/Throat:     Mouth: Mucous membranes are moist.     Pharynx: Oropharynx is clear. Uvula midline.  Eyes:     Extraocular Movements: Extraocular movements intact.     Conjunctiva/sclera: Conjunctivae normal.     Pupils: Pupils are equal, round, and reactive to light.  Neck:     Thyroid: No thyromegaly.     Trachea: No tracheal deviation.  Cardiovascular:     Rate and Rhythm: Normal rate and regular rhythm.     Pulses:          Dorsalis pedis pulses are 2+ on the right side and 2+ on the left side.     Heart sounds: No murmur heard. Pulmonary:     Effort: Pulmonary effort is normal. No respiratory distress.     Breath sounds: Normal breath sounds.  Abdominal:     Palpations: Abdomen is soft. There is no hepatomegaly or mass.     Tenderness: There is no abdominal tenderness.  Genitourinary:    Comments: Deferred to gyn. Musculoskeletal:     Right elbow: No swelling, deformity or lacerations. Normal range of motion. Tenderness  present in lateral epicondyle.     Comments: No signs of synovitis appreciated.  Lymphadenopathy:     Cervical: No cervical adenopathy.     Upper Body:     Right upper body: No supraclavicular adenopathy.     Left upper body: No supraclavicular adenopathy.  Skin:    General: Skin is warm.     Findings: No erythema or rash.     Comments: No active rash, some peel/scaly areas on finger and toes.  Neurological:     General: No focal deficit present.     Mental Status: She is alert and oriented to person, place, and time.     Cranial Nerves: No cranial nerve deficit.     Coordination: Coordination normal.     Gait: Gait normal.     Deep Tendon Reflexes:     Reflex Scores:      Bicep reflexes are 2+ on the right side and 2+ on the left side.      Patellar reflexes are 2+ on the right side and 2+ on the left side. Psychiatric:        Mood and Affect: Mood is anxious.     Comments: Well groomed, good eye contact.   ASSESSMENT AND PLAN:  Ms. Marcus Groll was here today annual physical examination.  Orders Placed This Encounter  Procedures   Comprehensive metabolic panel   Lipid panel   PPD   Lab Results  Component Value Date   CHOL 203 (H) 11/08/2021   HDL 43.60 11/08/2021   LDLCALC 126 (H) 11/08/2021   TRIG 169.0 (H) 11/08/2021   CHOLHDL 5 11/08/2021   Lab Results  Component Value Date   CREATININE 0.65 11/08/2021   BUN 7 11/08/2021   NA 140 11/08/2021   K 3.8 11/08/2021   CL 106 11/08/2021   CO2 24 11/08/2021   Lab Results  Component Value Date   ALT 14 11/08/2021   AST 17 11/08/2021   ALKPHOS 69 11/08/2021   BILITOT 0.5 11/08/2021   Routine general medical examination at a health care facility We discussed the importance of regular physical activity and healthy diet for prevention of chronic illness and/or complications. Preventive guidelines reviewed. Vaccination up to date. Continue her female care with gyn. Next CPE in a year.  The 10-year ASCVD  risk score (Arnett DK, et al., 2019)  is: 0.9%   Values used to calculate the score:     Age: 80 years     Sex: Female     Is Non-Hispanic African American: No     Diabetic: No     Tobacco smoker: No     Systolic Blood Pressure: 208 mmHg     Is BP treated: No     HDL Cholesterol: 43.6 mg/dL     Total Cholesterol: 203 mg/dL  Screening for lipoid disorders -     Lipid panel  Screening for endocrine, metabolic and immunity disorder -     Comprehensive metabolic panel  Need for tuberculosis vaccination -     PPD  Lumbar radiculopathy Gabapentin did not help before. We discussed a few options, she would like to try Lyrica, start with 25 mg daily at night and increase to bid in a few days of well tolerated. She was instructed to let me know if medication is helping and f/u in 3 months if she decides to continue treatment. Continue following with Dr Lucia Gaskins.  -     pregabalin (LYRICA) 25 MG capsule; Take 1 capsule (25 mg total) by mouth 2 (two) times daily.  Lateral epicondylitis of right elbow Mild. We discussed treatment options, PT. She prefers to hold on PT and imaging. F/U as needed.  Mood disorder [facility]) Following with psychiatrist.  Asthma, intermittent, uncomplicated Problem is well controlled. Continue Albuterol inh 2 puff q 4-6 hours.  Dyshidrotic eczema We discussed Dx and prognosis. Clobetasol has helped, so continue small amount on affected area bid prn.  Headache, unspecified headache type Problem has improved with Amitriptyline 10 mg at bedtime, so no changes.  Return in 3 months (on 02/08/2022) for Lumbar radiculopathy.  Godfrey Tritschler G. [place], MD  [facility]. [place] office.

## 2021-11-10 ENCOUNTER — Encounter (HOSPITAL_BASED_OUTPATIENT_CLINIC_OR_DEPARTMENT_OTHER): Payer: Self-pay | Admitting: Surgery

## 2021-11-10 ENCOUNTER — Other Ambulatory Visit: Payer: Self-pay

## 2021-11-10 LAB — TB SKIN TEST
Induration: 0 mm
TB Skin Test: NEGATIVE

## 2021-11-10 NOTE — Progress Notes (Signed)

## 2021-11-11 ENCOUNTER — Ambulatory Visit: Payer: BC Managed Care – PPO | Admitting: Psychiatry

## 2021-11-15 ENCOUNTER — Telehealth: Payer: Self-pay | Admitting: Physical Medicine and Rehabilitation

## 2021-11-15 NOTE — Telephone Encounter (Signed)
Scheduled

## 2021-11-15 NOTE — Telephone Encounter (Signed)
Pt called and states that her injection on 10/27 did not really help at all. She said the injection she had in august worked better. It looks like you did that same injection? She would like to scheduled another appt.   CB [phone]

## 2021-11-16 ENCOUNTER — Telehealth: Payer: BC Managed Care – PPO | Admitting: Plastic Surgery

## 2021-11-17 ENCOUNTER — Ambulatory Visit
Admission: RE | Admit: 2021-11-17 | Discharge: 2021-11-17 | Disposition: A | Discharge status: Home / Self Care | Payer: BC Managed Care – PPO | Source: Ambulatory Visit | Attending: Surgery | Admitting: Surgery

## 2021-11-17 ENCOUNTER — Other Ambulatory Visit: Payer: Self-pay

## 2021-11-17 DIAGNOSIS — D241 Benign neoplasm of right breast: Secondary | ICD-10-CM

## 2021-11-17 DIAGNOSIS — N6311 Unspecified lump in the right breast, upper outer quadrant: Secondary | ICD-10-CM | POA: Diagnosis not present

## 2021-11-18 ENCOUNTER — Other Ambulatory Visit: Payer: Self-pay

## 2021-11-18 ENCOUNTER — Encounter (HOSPITAL_BASED_OUTPATIENT_CLINIC_OR_DEPARTMENT_OTHER): Payer: Self-pay | Admitting: Surgery

## 2021-11-18 ENCOUNTER — Encounter (HOSPITAL_BASED_OUTPATIENT_CLINIC_OR_DEPARTMENT_OTHER)
Admission: RE | Disposition: A | Discharge status: Home / Self Care | Payer: Self-pay | Source: Home / Self Care | Attending: Surgery

## 2021-11-18 ENCOUNTER — Ambulatory Visit
Admission: RE | Admit: 2021-11-18 | Discharge: 2021-11-18 | Disposition: A | Discharge status: Home / Self Care | Payer: BC Managed Care – PPO | Source: Ambulatory Visit | Attending: Surgery | Admitting: Surgery

## 2021-11-18 ENCOUNTER — Ambulatory Visit (HOSPITAL_BASED_OUTPATIENT_CLINIC_OR_DEPARTMENT_OTHER): Payer: BC Managed Care – PPO | Admitting: Anesthesiology

## 2021-11-18 ENCOUNTER — Ambulatory Visit (HOSPITAL_BASED_OUTPATIENT_CLINIC_OR_DEPARTMENT_OTHER)
Admission: RE | Admit: 2021-11-18 | Discharge: 2021-11-18 | Disposition: A | Discharge status: Home / Self Care | Payer: BC Managed Care – PPO | Attending: Surgery | Admitting: Surgery

## 2021-11-18 ENCOUNTER — Ambulatory Visit: Payer: BC Managed Care – PPO | Admitting: Psychiatry

## 2021-11-18 DIAGNOSIS — D241 Benign neoplasm of right breast: Secondary | ICD-10-CM | POA: Diagnosis not present

## 2021-11-18 DIAGNOSIS — R928 Other abnormal and inconclusive findings on diagnostic imaging of breast: Secondary | ICD-10-CM | POA: Diagnosis not present

## 2021-11-18 DIAGNOSIS — N6011 Diffuse cystic mastopathy of right breast: Secondary | ICD-10-CM | POA: Diagnosis not present

## 2021-11-18 HISTORY — DX: Other specified postprocedural states: Z98.890

## 2021-11-18 HISTORY — DX: Nausea with vomiting, unspecified: R11.2

## 2021-11-18 HISTORY — DX: Anxiety disorder, unspecified: F41.9

## 2021-11-18 HISTORY — PX: BREAST LUMPECTOMY WITH RADIOACTIVE SEED LOCALIZATION: SHX6424

## 2021-11-18 LAB — POCT PREGNANCY, URINE: Preg Test, Ur: NEGATIVE

## 2021-11-18 SURGERY — BREAST LUMPECTOMY WITH RADIOACTIVE SEED LOCALIZATION
Anesthesia: General | Site: Breast | Laterality: Right

## 2021-11-18 MED ORDER — PROPOFOL 500 MG/50ML IV EMUL
INTRAVENOUS | Status: DC | PRN
  Administered 2021-11-18: 100 ug/kg/min via INTRAVENOUS

## 2021-11-18 MED ORDER — FENTANYL CITRATE (PF) 100 MCG/2ML IJ SOLN
INTRAMUSCULAR | Status: AC
  Filled 2021-11-18: qty 2

## 2021-11-18 MED ORDER — PROPOFOL 10 MG/ML IV BOLUS
INTRAVENOUS | Status: AC
  Filled 2021-11-18: qty 20

## 2021-11-18 MED ORDER — ONDANSETRON HCL 4 MG PO TABS
4.0000 mg | ORAL_TABLET | Freq: Three times a day (TID) | ORAL | 0 refills | Status: DC | PRN
Start: 2021-11-18 — End: 2022-03-04

## 2021-11-18 MED ORDER — OXYCODONE HCL 5 MG PO TABS: 5.0000 mg | ORAL_TABLET | Freq: Once | ORAL | Status: DC | PRN

## 2021-11-18 MED ORDER — MIDAZOLAM HCL 2 MG/2ML IJ SOLN
INTRAMUSCULAR | Status: AC
  Filled 2021-11-18: qty 2

## 2021-11-18 MED ORDER — CHLORHEXIDINE GLUCONATE CLOTH 2 % EX PADS: 6.0000 | MEDICATED_PAD | Freq: Once | CUTANEOUS | Status: DC

## 2021-11-18 MED ORDER — DEXMEDETOMIDINE (PRECEDEX) IN NS 20 MCG/5ML (4 MCG/ML) IV SYRINGE
PREFILLED_SYRINGE | INTRAVENOUS | Status: DC | PRN
  Administered 2021-11-18: 8 ug via INTRAVENOUS
  Administered 2021-11-18: 4 ug via INTRAVENOUS
  Administered 2021-11-18: 8 ug via INTRAVENOUS

## 2021-11-18 MED ORDER — ONDANSETRON HCL 4 MG/2ML IJ SOLN: 4.0000 mg | Freq: Once | INTRAMUSCULAR | Status: DC | PRN

## 2021-11-18 MED ORDER — ACETAMINOPHEN 500 MG PO TABS
ORAL_TABLET | ORAL | Status: AC
  Filled 2021-11-18: qty 2

## 2021-11-18 MED ORDER — MEPERIDINE HCL 25 MG/ML IJ SOLN: 6.2500 mg | INTRAMUSCULAR | Status: DC | PRN

## 2021-11-18 MED ORDER — CEFAZOLIN SODIUM-DEXTROSE 2-4 GM/100ML-% IV SOLN
INTRAVENOUS | Status: AC
  Filled 2021-11-18: qty 100

## 2021-11-18 MED ORDER — FENTANYL CITRATE (PF) 100 MCG/2ML IJ SOLN
INTRAMUSCULAR | Status: DC | PRN
  Administered 2021-11-18 (×2): 50 ug via INTRAVENOUS

## 2021-11-18 MED ORDER — PROPOFOL 10 MG/ML IV BOLUS
INTRAVENOUS | Status: DC | PRN
  Administered 2021-11-18: 200 mg via INTRAVENOUS

## 2021-11-18 MED ORDER — FENTANYL CITRATE (PF) 100 MCG/2ML IJ SOLN: 25.0000 ug | INTRAMUSCULAR | Status: DC | PRN

## 2021-11-18 MED ORDER — ACETAMINOPHEN 500 MG PO TABS
1000.0000 mg | ORAL_TABLET | ORAL | Status: AC
  Administered 2021-11-18: 1000 mg via ORAL

## 2021-11-18 MED ORDER — SCOPOLAMINE 1 MG/3DAYS TD PT72
MEDICATED_PATCH | TRANSDERMAL | Status: DC | PRN
  Administered 2021-11-18: 1 via TRANSDERMAL

## 2021-11-18 MED ORDER — SCOPOLAMINE 1 MG/3DAYS TD PT72
MEDICATED_PATCH | TRANSDERMAL | Status: AC
  Filled 2021-11-18: qty 1

## 2021-11-18 MED ORDER — DEXMEDETOMIDINE (PRECEDEX) IN NS 20 MCG/5ML (4 MCG/ML) IV SYRINGE
PREFILLED_SYRINGE | INTRAVENOUS | Status: AC
  Filled 2021-11-18: qty 5

## 2021-11-18 MED ORDER — ONDANSETRON HCL 4 MG/2ML IJ SOLN
INTRAMUSCULAR | Status: DC | PRN
  Administered 2021-11-18: 4 mg via INTRAVENOUS

## 2021-11-18 MED ORDER — OXYCODONE HCL 5 MG/5ML PO SOLN: 5.0000 mg | Freq: Once | ORAL | Status: DC | PRN

## 2021-11-18 MED ORDER — DEXAMETHASONE SODIUM PHOSPHATE 4 MG/ML IJ SOLN
INTRAMUSCULAR | Status: DC | PRN
  Administered 2021-11-18: 8 mg via INTRAVENOUS

## 2021-11-18 MED ORDER — LIDOCAINE 2% (20 MG/ML) 5 ML SYRINGE
INTRAMUSCULAR | Status: AC
  Filled 2021-11-18: qty 5

## 2021-11-18 MED ORDER — ACETAMINOPHEN 325 MG PO TABS: 325.0000 mg | ORAL_TABLET | ORAL | Status: DC | PRN

## 2021-11-18 MED ORDER — MIDAZOLAM HCL 5 MG/5ML IJ SOLN
INTRAMUSCULAR | Status: DC | PRN
  Administered 2021-11-18 (×2): 2 mg via INTRAVENOUS

## 2021-11-18 MED ORDER — LACTATED RINGERS IV SOLN: INTRAVENOUS | Status: DC

## 2021-11-18 MED ORDER — BUPIVACAINE-EPINEPHRINE 0.25% -1:200000 IJ SOLN
INTRAMUSCULAR | Status: DC | PRN
  Administered 2021-11-18: 10 mL

## 2021-11-18 MED ORDER — ONDANSETRON HCL 4 MG/2ML IJ SOLN
INTRAMUSCULAR | Status: AC
  Filled 2021-11-18: qty 2

## 2021-11-18 MED ORDER — LIDOCAINE 2% (20 MG/ML) 5 ML SYRINGE
INTRAMUSCULAR | Status: DC | PRN
  Administered 2021-11-18: 60 mg via INTRAVENOUS

## 2021-11-18 MED ORDER — CEFAZOLIN SODIUM-DEXTROSE 2-4 GM/100ML-% IV SOLN
2.0000 g | INTRAVENOUS | Status: AC
  Administered 2021-11-18: 2 g via INTRAVENOUS

## 2021-11-18 MED ORDER — DEXAMETHASONE SODIUM PHOSPHATE 10 MG/ML IJ SOLN
INTRAMUSCULAR | Status: AC
  Filled 2021-11-18: qty 1

## 2021-11-18 MED ORDER — ACETAMINOPHEN 160 MG/5ML PO SOLN: 325.0000 mg | ORAL | Status: DC | PRN

## 2021-11-18 SURGICAL SUPPLY — 45 items
APL PRP STRL LF DISP 70% ISPRP (MISCELLANEOUS) ×1
APL SKNCLS STERI-STRIP NONHPOA (GAUZE/BANDAGES/DRESSINGS) ×1
APPLIER CLIP 9.375 MED OPEN (MISCELLANEOUS) ×2
APR CLP MED 9.3 20 MLT OPN (MISCELLANEOUS) ×1
BENZOIN TINCTURE PRP APPL 2/3 (GAUZE/BANDAGES/DRESSINGS) ×2 IMPLANT
BLADE HEX COATED 2.75 (ELECTRODE) ×2 IMPLANT
BLADE SURG 15 STRL LF DISP TIS (BLADE) ×1 IMPLANT
BLADE SURG 15 STRL SS (BLADE) ×2
CANISTER SUC SOCK COL 7IN (MISCELLANEOUS) IMPLANT
CANISTER SUCT 1200ML W/VALVE (MISCELLANEOUS) IMPLANT
CHLORAPREP W/TINT 26 (MISCELLANEOUS) ×2 IMPLANT
CLIP APPLIE 9.375 MED OPEN (MISCELLANEOUS) ×1 IMPLANT
COVER BACK TABLE 60X90IN (DRAPES) ×2 IMPLANT
COVER MAYO STAND STRL (DRAPES) ×2 IMPLANT
COVER PROBE W GEL 5X96 (DRAPES) ×2 IMPLANT
DRAPE LAPAROTOMY 100X72 PEDS (DRAPES) ×2 IMPLANT
DRAPE UTILITY XL STRL (DRAPES) ×2 IMPLANT
DRSG TEGADERM 4X4.75 (GAUZE/BANDAGES/DRESSINGS) ×2 IMPLANT
ELECT REM PT RETURN 9FT ADLT (ELECTROSURGICAL) ×2
ELECTRODE REM PT RTRN 9FT ADLT (ELECTROSURGICAL) ×1 IMPLANT
GAUZE SPONGE 4X4 12PLY STRL LF (GAUZE/BANDAGES/DRESSINGS) ×2 IMPLANT
GLOVE SURG ENC MOIS LTX SZ7 (GLOVE) ×2 IMPLANT
GLOVE SURG POLYISO LF SZ7 (GLOVE) ×2 IMPLANT
GLOVE SURG UNDER POLY LF SZ7 (GLOVE) ×2 IMPLANT
GLOVE SURG UNDER POLY LF SZ7.5 (GLOVE) ×2 IMPLANT
GOWN STRL REUS W/ TWL LRG LVL3 (GOWN DISPOSABLE) ×2 IMPLANT
GOWN STRL REUS W/TWL LRG LVL3 (GOWN DISPOSABLE) ×4
KIT MARKER MARGIN INK (KITS) ×2 IMPLANT
NEEDLE HYPO 25X1 1.5 SAFETY (NEEDLE) ×2 IMPLANT
NS IRRIG 1000ML POUR BTL (IV SOLUTION) ×2 IMPLANT
PACK BASIN DAY SURGERY FS (CUSTOM PROCEDURE TRAY) ×2 IMPLANT
PENCIL SMOKE EVACUATOR (MISCELLANEOUS) ×2 IMPLANT
SLEEVE SCD COMPRESS KNEE MED (STOCKING) ×2 IMPLANT
SPONGE T-LAP 4X18 ~~LOC~~+RFID (SPONGE) ×2 IMPLANT
STRIP CLOSURE SKIN 1/2X4 (GAUZE/BANDAGES/DRESSINGS) ×2 IMPLANT
SUT MON AB 4-0 PC3 18 (SUTURE) ×2 IMPLANT
SUT SILK 2 0 SH (SUTURE) IMPLANT
SUT VIC AB 3-0 SH 27 (SUTURE) ×2
SUT VIC AB 3-0 SH 27X BRD (SUTURE) ×1 IMPLANT
SYR BULB EAR ULCER 3OZ GRN STR (SYRINGE) IMPLANT
SYR CONTROL 10ML LL (SYRINGE) ×2 IMPLANT
TOWEL GREEN STERILE FF (TOWEL DISPOSABLE) ×2 IMPLANT
TRAY FAXITRON CT DISP (TRAY / TRAY PROCEDURE) ×2 IMPLANT
TUBE CONNECTING 20X1/4 (TUBING) IMPLANT
YANKAUER SUCT BULB TIP NO VENT (SUCTIONS) IMPLANT

## 2021-11-18 NOTE — Anesthesia Postprocedure Evaluation (Signed)
Anesthesia Post Note  Patient: [name]  Procedure(s) Performed: RIGHT BREAST LUMPECTOMY WITH RADIOACTIVE SEED LOCALIZATION (Right: Breast)     Patient location during evaluation: PACU Anesthesia Type: General Level of consciousness: awake and alert and oriented Pain management: pain level controlled Vital Signs Assessment: post-procedure vital signs reviewed and stable Respiratory status: spontaneous breathing, nonlabored ventilation and respiratory function stable Cardiovascular status: blood pressure returned to baseline and stable Postop Assessment: no apparent nausea or vomiting Anesthetic complications: no   No notable events documented.  Last Vitals:  Vitals:   11/18/21 1430 11/18/21 1509  BP: 111/72 119/69  Pulse: 91 81  Resp: 11 14  Temp:  36.4 C  SpO2: 95% 97%    Last Pain:  Vitals:   11/18/21 1509  TempSrc:   PainSc: 0-No pain                 Jake Fuhrmann A.

## 2021-11-18 NOTE — Discharge Instructions (Addendum)
[facility] Office Phone Number [phone]  BREAST BIOPSY/ PARTIAL MASTECTOMY: POST OP INSTRUCTIONS  Always review your discharge instruction sheet given to you by the facility where your surgery was performed.  IF YOU HAVE DISABILITY OR FAMILY LEAVE FORMS, YOU MUST BRING THEM TO THE OFFICE FOR PROCESSING.  DO NOT GIVE THEM TO YOUR DOCTOR.  A prescription for pain medication may be given to you upon discharge.  Take your pain medication as prescribed, if needed.  If narcotic pain medicine is not needed, then you may take acetaminophen (Tylenol) or ibuprofen (Advil) as needed. Take your usually prescribed medications unless otherwise directed If you need a refill on your pain medication, please contact your pharmacy.  They will contact our office to request authorization.  Prescriptions will not be filled after 5pm or on week-ends. You should eat very light the first 24 hours after surgery, such as soup, crackers, pudding, etc.  Resume your normal diet the day after surgery. Most patients will experience some swelling and bruising in the breast.  Ice packs and a good support bra will help.  Swelling and bruising can take several days to resolve.  It is common to experience some constipation if taking pain medication after surgery.  Increasing fluid intake and taking a stool softener will usually help or prevent this problem from occurring.  A mild laxative (Milk of Magnesia or Miralax) should be taken according to package directions if there are no bowel movements after 48 hours. Unless discharge instructions indicate otherwise, you may remove your bandages 24-48 hours after surgery, and you may shower at that time.  You may have steri-strips (small skin tapes) in place directly over the incision.  These strips should be left on the skin for 7-10 days.  If your surgeon used skin glue on the incision, you may shower in 24 hours.  The glue will flake off over the next 2-3 weeks.  Any  sutures or staples will be removed at the office during your follow-up visit. ACTIVITIES:  You may resume regular daily activities (gradually increasing) beginning the next day.  Wearing a good support bra or sports bra minimizes pain and swelling.  You may have sexual intercourse when it is comfortable. You may drive when you no longer are taking prescription pain medication, you can comfortably wear a seatbelt, and you can safely maneuver your car and apply brakes. RETURN TO WORK:  ______________________________________________________________________________________ You should see your doctor in the office for a follow-up appointment approximately two weeks after your surgery.  Your doctor's nurse will typically make your follow-up appointment when she calls you with your pathology report.  Expect your pathology report 2-3 business days after your surgery.  You may call to check if you do not hear from us after three days. OTHER INSTRUCTIONS: _______________________________________________________________________________________________ _____________________________________________________________________________________________________________________________________ _____________________________________________________________________________________________________________________________________ _____________________________________________________________________________________________________________________________________  WHEN TO CALL YOUR DOCTOR: Fever over 101.0 Nausea and/or vomiting. Extreme swelling or bruising. Continued bleeding from incision. Increased pain, redness, or drainage from the incision.  The clinic staff is available to answer your questions during regular business hours.  Please don't hesitate to call and ask to speak to one of the nurses for clinical concerns.  If you have a medical emergency, go to the nearest emergency room or call 911.  A surgeon from [facility] is always on call at the hospital.  For further questions, please visit [email or web address]    Post Anesthesia Home Care Instructions  Activity: Get plenty of rest for the remainder of   the day. A responsible individual must stay with you for 24 hours following the procedure.  For the next 24 hours, DO NOT: -Drive a car -Paediatric nurse -Drink alcoholic beverages -Take any medication unless instructed by your physician -Make any legal decisions or sign important papers.  Meals: Start with liquid foods such as gelatin or soup. Progress to regular foods as tolerated. Avoid greasy, spicy, heavy foods. If nausea and/or vomiting occur, drink only clear liquids until the nausea and/or vomiting subsides. Call your physician if vomiting continues.  Special Instructions/Symptoms: Your throat may feel dry or sore from the anesthesia or the breathing tube placed in your throat during surgery. If this causes discomfort, gargle with warm salt water. The discomfort should disappear within 24 hours.  If you had a scopolamine patch placed behind your ear for the management of post- operative nausea and/or vomiting:  1. The medication in the patch is effective for 72 hours, after which it should be removed.  Wrap patch in a tissue and discard in the trash. Wash hands thoroughly with soap and water. 2. You may remove the patch earlier than 72 hours if you experience unpleasant side effects which may include dry mouth, dizziness or visual disturbances. 3. Avoid touching the patch. Wash your hands with soap and water after contact with the patch.     No Tylenol until 6:09 pm

## 2021-11-18 NOTE — Anesthesia Preprocedure Evaluation (Signed)
Anesthesia Evaluation  Patient identified by MRN, date of birth, ID band Patient awake    Reviewed: Allergy & Precautions, H&P , NPO status , Patient's Chart, lab work & pertinent test results, reviewed documented beta blocker date and time   History of Anesthesia Complications (+) PONV and history of anesthetic complications  Airway Mallampati: II  TM Distance: >3 FB Neck ROM: full    Dental no notable dental hx.    Pulmonary asthma ,    Pulmonary exam normal breath sounds clear to auscultation       Cardiovascular Exercise Tolerance: Good negative cardio ROS   Rhythm:regular Rate:Normal     Neuro/Psych  Headaches, PSYCHIATRIC DISORDERS Anxiety    GI/Hepatic Neg liver ROS, GERD  Medicated,  Endo/Other  negative endocrine ROS  Renal/GU negative Renal ROS  negative genitourinary   Musculoskeletal   Abdominal   Peds  Hematology negative hematology ROS (+)   Anesthesia Other Findings   Reproductive/Obstetrics negative OB ROS                             Anesthesia Physical Anesthesia Plan  ASA: 2  Anesthesia Plan: General   Post-op Pain Management:    Induction: Intravenous  PONV Risk Score and Plan: 4 or greater and Ondansetron, Dexamethasone, Midazolam and Treatment may vary due to age or medical condition  Airway Management Planned: Oral ETT  Additional Equipment:   Intra-op Plan:   Post-operative Plan: Extubation in OR  Informed Consent: I have reviewed the patients History and Physical, chart, labs and discussed the procedure including the risks, benefits and alternatives for the proposed anesthesia with the patient or authorized representative who has indicated his/her understanding and acceptance.     Dental Advisory Given  Plan Discussed with: CRNA and Anesthesiologist  Anesthesia Plan Comments: (  )        Anesthesia Quick Evaluation

## 2021-11-18 NOTE — Transfer of Care (Signed)
Immediate Anesthesia Transfer of Care Note  Patient: Danielle [name]  Procedure(s) Performed: RIGHT BREAST LUMPECTOMY WITH RADIOACTIVE SEED LOCALIZATION (Right: Breast)  Patient Location: PACU  Anesthesia Type:General  Level of Consciousness: drowsy  Airway & Oxygen Therapy: Patient Spontanous Breathing and Patient connected to face mask oxygen  Post-op Assessment: Report given to RN and Post -op Vital signs reviewed and stable  Post vital signs: Reviewed and stable  Last Vitals:  Vitals Value Taken Time  BP    Temp    Pulse 102 11/18/21 1359  Resp    SpO2 98 % 11/18/21 1359  Vitals shown include unvalidated device data.  Last Pain:  Vitals:   11/18/21 1157  TempSrc: Oral  PainSc: 6       Patients Stated Pain Goal: 3 (16/94/50 3888)  Complications: No notable events documented.

## 2021-11-18 NOTE — Anesthesia Procedure Notes (Signed)
Procedure Name: LMA Insertion Date/Time: 11/18/2021 1:14 PM Performed by: Ezequiel Kayser, CRNA Pre-anesthesia Checklist: Patient identified, Emergency Drugs available, Suction available and Patient being monitored Patient Re-evaluated:Patient Re-evaluated prior to induction Oxygen Delivery Method: Circle System Utilized Preoxygenation: Pre-oxygenation with 100% oxygen Induction Type: IV induction Ventilation: Mask ventilation without difficulty LMA: LMA inserted LMA Size: 4.0 Number of attempts: 1 Airway Equipment and Method: Bite block Placement Confirmation: positive ETCO2 Tube secured with: Tape Dental Injury: Teeth and Oropharynx as per pre-operative assessment

## 2021-11-18 NOTE — Op Note (Signed)
Pre-op Diagnosis:  Right breast intraductal papilloma Post-op Diagnosis: same Procedure:  Right radioactive seed localized lumpectomy Surgeon:  Amy Belloso K. 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  Further evaluation with diagnostic mammogram and ultrasound showed a 4 mm mass in the right upper outer quadrant just outside the areola.  There was a second adjacent mass.  Both of these were biopsied under stereotactic guidance.  The upper outer central mass marked with an X shaped clip showed intraductal papilloma with usual ductal hyperplasia.  The upper central mass marked with a coil clip showed fibrocystic changes with apocrine metaplasia.  Unfortunately, the X shaped clip seems to have migrated 2.5 cm posterior inferior.  The patient presents today for consultation regarding excision Description of procedure: The patient is brought to the operating room placed in supine position on the operating room table. After an adequate level of general anesthesia was obtained, her right breast was prepped with ChloraPrep and draped in sterile fashion. A timeout was taken to ensure the proper patient and proper procedure. We interrogated the breast with the neoprobe. We made a circumareolar incision around the upper side of the nipple after infiltrating with 0.25% Marcaine. Dissection was carried down in the breast tissue with cautery. We used the neoprobe to guide [place] towards the radioactive seed. The seed is located deep in the breast almost to the chest wall.  We excised an area of tissue around the radioactive seed 2 cm in diameter. The specimen was removed and was oriented with a paint kit. Specimen mammogram showed the radioactive seed as well as two biopsy clips within the specimen. This was sent for pathologic examination. There is no residual  radioactivity within the biopsy cavity. We inspected carefully for hemostasis. The wound was thoroughly irrigated. The wound was closed with a deep layer of 3-0 Vicryl and a subcuticular layer of 4-0 Monocryl. Benzoin Steri-Strips were applied. The patient was then extubated and brought to the recovery room in stable condition. All sponge, instrument, and needle counts are correct.  Danielle [name]. Danielle Dover, MD, [facility]  General/ Trauma Surgery  11/18/2021 1:58 PM

## 2021-11-18 NOTE — H&P (Signed)
Subjective    Chief Complaint: Breast Mass       History of Present Illness: Danielle [name] is a 40 y.o. female who is seen today as an office consultation at the request of Dr. Julien Girt for evaluation of Breast Mass .   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  Further evaluation with diagnostic mammogram and ultrasound showed a 4 mm mass in the right upper outer quadrant just outside the areola.  There was a second adjacent mass.  Both of these were biopsied under stereotactic guidance.  The upper outer central mass marked with an X shaped clip showed intraductal papilloma with usual ductal hyperplasia.  The upper central mass marked with a coil clip showed fibrocystic changes with apocrine metaplasia.  Unfortunately, the X shaped clip seems to have migrated 2.5 cm posterior inferior.  The patient presents today for consultation regarding excision.   The patient denies any previous breast problems.  She denies any symptoms prior to her mammogram.  She is very anxious about her diagnosis of intraductal papilloma.   The patient is undergoing treatment for some lumbar disc problems causing intermittent radiculopathy.  She is trying to avoid surgery.   Review of Systems: A complete review of systems was obtained from the patient.  I have reviewed this information and discussed as appropriate with the patient.  See HPI as well for other ROS.   Review of Systems  Constitutional: Negative.   HENT: Negative.   Eyes: Negative.   Respiratory: Negative.   Cardiovascular: Negative.   Gastrointestinal: Negative.   Genitourinary: Negative.   Musculoskeletal: Positive for back pain and myalgias.  Skin: Negative.   Neurological: Negative.   Endo/Heme/Allergies: Negative.   Psychiatric/Behavioral: Negative.         Medical History:     Past Medical History:  Diagnosis Date   Anxiety      Arrhythmia     Asthma, unspecified asthma severity, unspecified whether complicated, unspecified whether persistent     GERD (gastroesophageal reflux disease)           Patient Active Problem List  Diagnosis   Agoraphobia with panic attacks   Asthma with exacerbation   Generalized anxiety disorder      History reviewed. No pertinent surgical history.         Allergies  Allergen Reactions   Doxycycline Nausea And Vomiting      Other reaction(s): Vomiting            Current Outpatient Medications on File Prior to Visit  Medication Sig Dispense Refill   albuterol 90 mcg/actuation inhaler INHALE 2 PUFFS INTO THE LUNGS EVERY 6 (SIX) HOURS AS NEEDED. FOR WHEEZE OR SHORTNESS OF BREATH       amitriptyline (ELAVIL) 10 MG tablet Take 1 tablet (10 mg total) by mouth at bedtime       clonazePAM (KLONOPIN) 0.5 MG tablet Take 1 tablet (0.5 mg total) by mouth 2 (two) times daily as needed       pantoprazole (PROTONIX) 40 MG DR tablet Take 1 tablet (40 mg total) by mouth once daily        No current facility-administered medications on file prior to visit.           Family History  Problem Relation Age of Onset   High blood pressure (Hypertension) Mother     Hyperlipidemia (Elevated cholesterol) Mother  High blood pressure (Hypertension) Father     Hyperlipidemia (Elevated cholesterol) Father        Social History       Tobacco Use  Smoking Status Never  Smokeless Tobacco Never      Social History         Socioeconomic History   Marital status: Married  Tobacco Use   Smoking status: Never   Smokeless tobacco: Never  Substance and Sexual Activity   Alcohol use: Yes      Comment: 2/3 drinks per month   Drug use: Never      Objective:         Vitals:     BP: 126/80  Pulse: (!) 132  Temp: 37.1 C (98.7 F)  SpO2: 98%  Weight: 83.3 kg (183 lb 9.6 oz)  Height: 154.9 cm (5\' 1" )    Body mass index is 34.69 kg/m.   Physical Exam    Constitutional:   WDWN in NAD, conversant, no obvious deformities; lying in bed comfortably Eyes:  Pupils equal, round; sclera anicteric; moist conjunctiva; no lid lag HENT:  Oral mucosa moist; good dentition  Neck:  No masses palpated, trachea midline; no thyromegaly Lungs:  CTA bilaterally; normal respiratory effort Breasts:  symmetric, no nipple changes; no palpable masses or lymphadenopathy on either side CV:  Regular rate and rhythm; no murmurs; extremities well-perfused with no edema Abd:  +bowel sounds, soft, non-tender, no palpable organomegaly; no palpable hernias Musc:  Unable to assess gait; no apparent clubbing or cyanosis in extremities Lymphatic:  No palpable cervical or axillary lymphadenopathy Skin:  Warm, dry; no sign of jaundice Psychiatric - alert and oriented x 4; calm mood and affect     Labs, Imaging and Diagnostic Testing: CLINICAL DATA:  40 year old female recalled from screening mammogram dated 08/20/2021 for possible right breast masses.   EXAM: DIGITAL DIAGNOSTIC UNILATERAL RIGHT MAMMOGRAM WITH TOMOSYNTHESIS AND CAD; ULTRASOUND RIGHT BREAST LIMITED   TECHNIQUE: Right digital diagnostic mammography and breast tomosynthesis was performed. The images were evaluated with computer-aided detection.; Targeted ultrasound examination of the right breast was performed   COMPARISON:  Previous exam(s).   ACR Breast Density Category b: There are scattered areas of fibroglandular density.   FINDINGS: There are 2 persistent oval, circumscribed equal density masses in the deep central right breast. Further evaluation with ultrasound was performed.   Targeted ultrasound is performed, showing an oval, circumscribed hypoechoic mass at the 1 o'clock position 2 cm from the nipple. It measures 4 x 4 x 2 mm. There is no associated vascularity. This is felt not to correspond with the deeper masses identified mammographically. Additional fibrocystic changes are seen deep along the 12 and 11  o'clock axis. These may correspond with the mammographic finding.   IMPRESSION: Probably benign right breast masses which likely correspond with fibrocystic changes identified sonographically. The option for short-term follow-up was discussed with the patient. However, she prefers definitive tissue diagnosis and would like to proceed with biopsies of both areas.   RECOMMENDATION: Two area stereotactic biopsy of the right breast.   I have discussed the findings and recommendations with the patient. If applicable, a reminder letter will be sent to the patient regarding the next appointment.   BI-RADS CATEGORY  3: Probably benign.     Electronically Signed   By: Kristopher Oppenheim M.D.   On: 08/31/2021 16:26   CLINICAL DATA:  Evaluate placement of X and COIL biopsy marker clip clips following 3D/stereotactic guided RIGHT breast biopsies.  EXAM: 3D DIAGNOSTIC RIGHT MAMMOGRAM POST STEREOTACTIC BIOPSY   COMPARISON:  Previous exam(s).   FINDINGS: 3D Mammographic images were obtained following stereotactic guided biopsy of the 0.4 cm UPPER-OUTER central RIGHT breast mass (X clip) in the 0.5 cm UPPER central RIGHT breast mass (COIL clip).   The X biopsy marking clip is felt to have migrated 2.5 cm posteroinferior to the biopsy cavity.   The COIL biopsy marking clip is in satisfactory position at the site of biopsy within the UPPER central RIGHT breast.   IMPRESSION: 2.5 cm posteroinferior migration of the X biopsy marking clip from the site of biopsy of the 0.4 cm UPPER-OUTER central RIGHT breast mass.   Appropriate positioning of the COIL shaped biopsy marking clip at the site of biopsy in the UPPER central RIGHT breast.   Final Assessment: Post Procedure Mammograms for Marker Placement     Electronically Signed   By: Margarette Canada M.D.   On: 09/08/2021 09:06   Assessment and Plan:  Diagnoses and all orders for this visit:   Intraductal papilloma of breast, right        After a lengthy discussion and answering her multiple questions, we have decided to proceed with excision of this area.  Localization will be slightly complicated by the migration of the biopsy marker but we will attempt radioactive seed localization of the mass.   Right radioactive seed localized lumpectomy.The surgical procedure has been discussed with the patient.  Potential risks, benefits, alternative treatments, and expected outcomes have been explained.  All of the patient's questions at this time have been answered.  The likelihood of reaching the patient's treatment goal is good.  The patient understand the proposed surgical procedure and wishes to proceed.    Imogene Burn. Georgette Dover, MD, [facility]  General Surgery   11/18/2021 12:27 PM

## 2021-11-19 ENCOUNTER — Encounter (HOSPITAL_BASED_OUTPATIENT_CLINIC_OR_DEPARTMENT_OTHER): Payer: Self-pay | Admitting: Surgery

## 2021-11-22 LAB — SURGICAL PATHOLOGY

## 2021-11-25 ENCOUNTER — Ambulatory Visit: Payer: BC Managed Care – PPO | Admitting: Psychiatry

## 2021-12-01 ENCOUNTER — Encounter: Payer: Self-pay | Admitting: Physical Medicine and Rehabilitation

## 2021-12-01 ENCOUNTER — Ambulatory Visit: Payer: Self-pay

## 2021-12-01 ENCOUNTER — Other Ambulatory Visit: Payer: Self-pay

## 2021-12-01 ENCOUNTER — Ambulatory Visit (INDEPENDENT_AMBULATORY_CARE_PROVIDER_SITE_OTHER): Payer: BC Managed Care – PPO | Admitting: Physical Medicine and Rehabilitation

## 2021-12-01 VITALS — BP 119/82 | HR 102

## 2021-12-01 DIAGNOSIS — M5416 Radiculopathy, lumbar region: Secondary | ICD-10-CM | POA: Diagnosis not present

## 2021-12-01 MED ORDER — METHYLPREDNISOLONE ACETATE 80 MG/ML IJ SUSP: 80.0000 mg | Freq: Once | INTRAMUSCULAR | Status: DC

## 2021-12-01 NOTE — Progress Notes (Signed)
Pt state lower back pain that travels down her left buttock, hip and leg. Pt state sitting, bending and twisting makes the pain worse. Pt state she takes over the counter pain meds to help ease her pain.  Numeric Pain Rating Scale and Functional Assessment Average Pain 7   In the last MONTH (on 0-10 scale) has pain interfered with the following?  1. General activity like being  able to carry out your everyday physical activities such as walking, climbing stairs, carrying groceries, or moving a chair?  Rating(9)   +Driver, -BT, -Dye Allergies.

## 2021-12-01 NOTE — Procedures (Signed)
Lumbar Epidural Steroid Injection - Interlaminar Approach with Fluoroscopic Guidance  Patient: Danielle [name]      Date of Birth: 12/06/1981 MRN: [id number] PCP: Martinique, Betty G, MD      Visit Date: 12/01/2021   Universal Protocol:     Consent Given By: the patient  Position: PRONE  Additional Comments: Vital signs were monitored before and after the procedure. Patient was prepped and draped in the usual sterile fashion. The correct patient, procedure, and site was verified.   Injection Procedure Details:   Procedure diagnoses: Lumbar radiculopathy [M54.16]   Meds Administered:  Meds ordered this encounter  Medications   methylPREDNISolone acetate (DEPO-MEDROL) injection 80 mg     Laterality: Left  Location/Site:  L5-S1  Needle: 3.5 in., 20 ga. Tuohy  Needle Placement: Paramedian epidural  Findings:   -Comments: Excellent flow of contrast into the epidural space.  Procedure Details: Using a paramedian approach from the side mentioned above, the region overlying the inferior lamina was localized under fluoroscopic visualization and the soft tissues overlying this structure were infiltrated with 4 ml. of 1% Lidocaine without Epinephrine. The Tuohy needle was inserted into the epidural space using a paramedian approach.   The epidural space was localized using loss of resistance along with counter oblique bi-planar fluoroscopic views.  After negative aspirate for air, blood, and CSF, a 2 ml. volume of Isovue-250 was injected into the epidural space and the flow of contrast was observed. Radiographs were obtained for documentation purposes.    The injectate was administered into the level noted above.   Additional Comments:  No complications occurred Dressing: 2 x 2 sterile gauze and Band-Aid    Post-procedure details: Patient was observed during the procedure. Post-procedure instructions were reviewed.  Patient left the clinic in stable condition.

## 2021-12-01 NOTE — Patient Instructions (Signed)

## 2021-12-01 NOTE — Progress Notes (Signed)
Danielle [name] - 40 y.o. female MRN [id number]  Date of birth: 1981-11-05  Office Visit Note: Visit Date: 12/01/2021 PCP: Martinique, Betty G, MD Referred by: Martinique, Betty G, MD  Subjective: Chief Complaint  Patient presents with   Lower Back - Pain   Left Hip - Pain   Left Leg - Pain   HPI:  Danielle [name] is a 40 y.o. female who comes in today for planned Left L5-S1 Lumbar Interlaminar epidural steroid injection with fluoroscopic guidance.  The patient has failed conservative care including home exercise, medications, time and activity modification.  This injection will be diagnostic and hopefully therapeutic.  Please see requesting physician notes for further details and justification.  Patient has had prior epidural injection from a transforaminal approach that was diagnostically successful just not lasting as well as she had hoped.  Her case is complicated by generalized anxiety disorder and history of other medications for that over the years without much help in general with toleration of those medicines.  She now has been placed on Lyrica to try to help with pain control for primary drops but she is unsure about taking that.  She has 3 any other options besides the injections or surgery.  She does have disc extrusion at L5-S1 on the left which does fit with her symptoms.  These typically do heal over time and this is been going on now for a year.  Unfortunately her chronic pain that has been going on for many years and is hard for her to distinguish between the 2.  She does not note any focal weakness.  We will see how she does with the interlaminar approach which is different we did discuss this at length.  I did suggest that she start the Lyrica and see if she tolerates that.  ROS Otherwise per HPI.  Assessment & Plan: Visit Diagnoses:    ICD-10-CM   1. Lumbar radiculopathy  M54.16 XR C-ARM NO REPORT    Epidural Steroid injection    methylPREDNISolone acetate (DEPO-MEDROL)  injection 80 mg      Plan: No additional findings.   Meds & Orders:  Meds ordered this encounter  Medications   methylPREDNISolone acetate (DEPO-MEDROL) injection 80 mg    Orders Placed This Encounter  Procedures   XR C-ARM NO REPORT   Epidural Steroid injection    Follow-up: Return if symptoms worsen or fail to improve.   Procedures: No procedures performed  Lumbar Epidural Steroid Injection - Interlaminar Approach with Fluoroscopic Guidance  Patient: Danielle [name]      Date of Birth: 14-Jan-1981 MRN: [id number] PCP: Martinique, Betty G, MD      Visit Date: 12/01/2021   Universal Protocol:     Consent Given By: the patient  Position: PRONE  Additional Comments: Vital signs were monitored before and after the procedure. Patient was prepped and draped in the usual sterile fashion. The correct patient, procedure, and site was verified.   Injection Procedure Details:   Procedure diagnoses: Lumbar radiculopathy [M54.16]   Meds Administered:  Meds ordered this encounter  Medications   methylPREDNISolone acetate (DEPO-MEDROL) injection 80 mg     Laterality: Left  Location/Site:  L5-S1  Needle: 3.5 in., 20 ga. Tuohy  Needle Placement: Paramedian epidural  Findings:   -Comments: Excellent flow of contrast into the epidural space.  Procedure Details: Using a paramedian approach from the side mentioned above, the region overlying the inferior lamina was localized under fluoroscopic visualization and the soft tissues  overlying this structure were infiltrated with 4 ml. of 1% Lidocaine without Epinephrine. The Tuohy needle was inserted into the epidural space using a paramedian approach.   The epidural space was localized using loss of resistance along with counter oblique bi-planar fluoroscopic views.  After negative aspirate for air, blood, and CSF, a 2 ml. volume of Isovue-250 was injected into the epidural space and the flow of contrast was observed. Radiographs  were obtained for documentation purposes.    The injectate was administered into the level noted above.   Additional Comments:  No complications occurred Dressing: 2 x 2 sterile gauze and Band-Aid    Post-procedure details: Patient was observed during the procedure. Post-procedure instructions were reviewed.  Patient left the clinic in stable condition.   Clinical History: MRI LUMBAR SPINE WITHOUT CONTRAST   TECHNIQUE: Multiplanar, multisequence MR imaging of the lumbar spine was performed. No intravenous contrast was administered.   COMPARISON:  None.   FINDINGS: Segmentation:  Standard.   Alignment:  Physiologic.   Vertebrae:  No fracture, evidence of discitis, or bone lesion.   Conus medullaris and cauda equina: Conus extends to the L2 level. Conus and cauda equina appear normal.   Paraspinal and other soft tissues: Choose   Disc levels:   T12-L1: Normal.   L1-L2: Normal disc space and facet joints. No spinal canal stenosis. No neural foraminal stenosis.   L2-L3: Normal disc space and facet joints. No spinal canal stenosis. No neural foraminal stenosis.   L3-L4: Normal disc space and facet joints. No spinal canal stenosis. No neural foraminal stenosis.   L4-L5: Small right foraminal disc protrusion close proximity to the exiting right L4 nerve root. No spinal canal stenosis. No neural foraminal stenosis.   L5-S1: Small left subarticular disc extrusion with minimal inferior migration. Left lateral recess narrowing without central spinal canal stenosis. No neural foraminal stenosis.   Visualized sacrum: Normal.   IMPRESSION: 1. L5-S1 small left subarticular disc extrusion with minimal inferior migration, narrowing the left lateral recess, a potential source of left S1 radiculopathy. 2. Small right foraminal disc protrusion at L4-L5 close proximity to the exiting right L4 nerve root, a potential source of right L4 radiculopathy.     Electronically  Signed   By: Ulyses Jarred M.D.   On: 06/24/2021 16:22     Objective:  VS:  HT:     WT:    BMI:      BP:119/82   HR:(!) 102bpm   TEMP: ( )   RESP:  Physical Exam Vitals and nursing note reviewed.  Constitutional:      General: She is not in acute distress.    Appearance: Normal appearance. She is not ill-appearing.  HENT:     Head: Normocephalic and atraumatic.     Right Ear: External ear normal.     Left Ear: External ear normal.  Eyes:     Extraocular Movements: Extraocular movements intact.  Cardiovascular:     Rate and Rhythm: Normal rate.     Pulses: Normal pulses.  Pulmonary:     Effort: Pulmonary effort is normal. No respiratory distress.  Abdominal:     General: There is no distension.     Palpations: Abdomen is soft.  Musculoskeletal:        General: Tenderness present.     Cervical back: Neck supple.     Right lower leg: No edema.     Left lower leg: No edema.     Comments: Patient has good distal  strength with no pain over the greater trochanters.  No clonus or focal weakness.  Skin:    Findings: No erythema, lesion or rash.  Neurological:     General: No focal deficit present.     Mental Status: She is alert and oriented to person, place, and time.     Sensory: No sensory deficit.     Motor: No weakness or abnormal muscle tone.     Coordination: Coordination normal.  Psychiatric:        Mood and Affect: Mood normal.        Behavior: Behavior normal.     Imaging: No results found.

## 2021-12-28 ENCOUNTER — Institutional Professional Consult (permissible substitution): Payer: BC Managed Care – PPO | Admitting: Plastic Surgery

## 2022-01-13 ENCOUNTER — Telehealth: Payer: Self-pay | Admitting: Family Medicine

## 2022-01-13 NOTE — Telephone Encounter (Signed)
Pt is calling and would like to know if md could increase the pantoprazole (PROTONIX) 40 MG tablet due to break through symptoms . Pt last seen nov 2022 [facility] [place], [place] Phone:  [phone]  Fax:  [phone]

## 2022-01-14 NOTE — Telephone Encounter (Signed)
She is already taking max dose, 40 mg. She can add pepcid 20-40 mg at bedtime and/or change to a different PPI ( Nexium,aciphex,Dexilant,or prevacid). If problem is persistent we may need to have her following with GI. Thanks, BJ

## 2022-01-17 MED ORDER — ESOMEPRAZOLE MAGNESIUM 40 MG PO CPDR: 40.0000 mg | DELAYED_RELEASE_CAPSULE | Freq: Every day | ORAL | 3 refills | Status: DC

## 2022-01-17 NOTE — Telephone Encounter (Signed)
I left patient a voicemail to return my call.  We can add on a medication for night time or change medication altogether. Pt is on max dosage of Protonix.

## 2022-01-17 NOTE — Addendum Note (Signed)
Addended by: Nathanial Millman E on: 01/17/2022 12:52 PM   Modules accepted: Orders

## 2022-01-17 NOTE — Telephone Encounter (Signed)
Patient called back. She would like to change to a different medication. Rx sent in for Nexium 40 mg daily.

## 2022-02-08 ENCOUNTER — Encounter (HOSPITAL_COMMUNITY): Payer: Self-pay

## 2022-02-15 ENCOUNTER — Other Ambulatory Visit: Payer: Self-pay

## 2022-02-15 MED ORDER — ESOMEPRAZOLE MAGNESIUM 40 MG PO CPDR: 40.0000 mg | DELAYED_RELEASE_CAPSULE | Freq: Every day | ORAL | 3 refills | Status: DC

## 2022-03-01 ENCOUNTER — Other Ambulatory Visit: Payer: Self-pay | Admitting: Family Medicine

## 2022-03-01 ENCOUNTER — Telehealth: Payer: Self-pay | Admitting: Family Medicine

## 2022-03-01 MED ORDER — CLOBETASOL PROPIONATE 0.05 % EX OINT: 1.0000 "application " | TOPICAL_OINTMENT | Freq: Every day | CUTANEOUS | 1 refills | Status: DC | PRN

## 2022-03-01 NOTE — Telephone Encounter (Signed)
Rx sent. Thanks, BJ 

## 2022-03-01 NOTE — Telephone Encounter (Signed)
Okay to change to ointment? ?

## 2022-03-01 NOTE — Telephone Encounter (Signed)
Patient called in requesting for Dr.Jordan to change clobetasol cream (TEMOVATE) 0.05 % [[id number]]  to the ointment instead because she doesn't do well with the cream. ? ?Please advise. ?

## 2022-03-04 ENCOUNTER — Other Ambulatory Visit: Payer: Self-pay

## 2022-03-04 ENCOUNTER — Encounter: Payer: Self-pay | Admitting: Psychiatry

## 2022-03-04 ENCOUNTER — Ambulatory Visit: Payer: BC Managed Care – PPO | Admitting: Psychiatry

## 2022-03-04 DIAGNOSIS — F410 Panic disorder [episodic paroxysmal anxiety] without agoraphobia: Secondary | ICD-10-CM | POA: Diagnosis not present

## 2022-03-04 DIAGNOSIS — F39 Unspecified mood [affective] disorder: Secondary | ICD-10-CM

## 2022-03-04 DIAGNOSIS — F5105 Insomnia due to other mental disorder: Secondary | ICD-10-CM

## 2022-03-04 DIAGNOSIS — F411 Generalized anxiety disorder: Secondary | ICD-10-CM

## 2022-03-04 DIAGNOSIS — F99 Mental disorder, not otherwise specified: Secondary | ICD-10-CM

## 2022-03-04 MED ORDER — AMITRIPTYLINE HCL 25 MG PO TABS: 25.0000 mg | ORAL_TABLET | Freq: Every day | ORAL | 2 refills | Status: DC

## 2022-03-04 MED ORDER — CLONAZEPAM 0.5 MG PO TABS: 0.5000 mg | ORAL_TABLET | Freq: Two times a day (BID) | ORAL | 2 refills | Status: DC | PRN

## 2022-03-04 NOTE — Progress Notes (Signed)
Danielle [name] ?[id number] ?1981-11-18 ?41 y.o. ? ?Subjective:  ? ?Patient ID:  Danielle [name] is a 41 y.o. (DOB February 07, 1981) female. ? ?Chief Complaint:  ?Chief Complaint  ?Patient presents with  ? Anxiety  ? Panic Attack  ? Depression  ? ? ?Anxiety ? ? ? ?Depression ?       Associated symptoms include headaches.  Past medical history includes anxiety.   ?Danielle [name] presents to the office today for follow-up of anxiety, panic, and depression. She had a panic attack while driving in [place] on Tuesday and this was the first full blown panic attack she has had in awhile. She reports that panic hit her abruptly while she was trying to get off an exit and "could not get my feet to work" to apply the gas or break. She reports that she routed herself home a different way and had some severe anxiety when going off an exit. She reports that she felt exhausted after this occurred. Another day she felt clammy and nauseous when getting off an exit in [place]. "I am afraid it is going to happen again, but I am trying not to dwell on it." She reports that her anxiety has been higher in general. 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 She reports some generalized anxiety and worry. She reports that her mood has been "a little up and down lately." She has had more days where she has wanted to withdraw from others. She has had some irritability due to pain. She has had some recent unsettling dreams. Sleep has been ok. Appetite has been decreased. Has not been interested in eating breakfast. Energy and motivation varies- "some days are good, some days are not." Energy and motivation wanes as the week progresses. Concentration has been ok. Denies anhedonia. Denies any manic s/s. Denies SI.  ? ?Had to have a lumpectomy in December after several mammograms, ultrasound, and biopsy and this caused anxiety.  ? ?She had injection several months ago that significantly helped  with back pain and she has had return of pain in the last few weeks.  ? ?Has been taking Klonopin prn more frequently with increased stress and travel with son's travel hockey.  ? ?Past Psychiatric Medication Trials: ?Abilify- Tremor ?Latuda ?Vraylar- Irritability, Agitation ?Rexulti- Palpitations ?Lamictal ?Viibryd- panic ?Trintellix- HA's, possible headaches. ?Prozac ?Effexor XR ?Zoloft ?Lexapro ?Wellbutrin- Headaches ?Buspar ?Xanax- excessive somnolence, ineffective ?Clorazepate- ineffective ?Klonopin ?Ativan ?Trileptal-ineffective ?Trazodone- Has had dizziness and nausea with doses above 100 mg.  ?Deplin ?Propranolol-Ineffective ?Clonidine- May have been helpful for panic s/s.  ?Gabapentin- Noticed restlessness with 900 mg.  ?Amitriptyline- Helpful for headaches and mood.  ? ?PHQ2-9   ? ?[employer] Office Visit from 11/08/2021 in [facility] [place] from 01/10/2017 in [facility] [place] from 10/07/2016 in [facility] at [facility] from 05/13/2016 in Primary Care at [facility]  ?PHQ-2 Total Score 2 0 1 0  ?PHQ-9 Total Score 7 -- -- --  ? ?  ? ?Flowsheet Row Admission (Discharged) from 11/18/2021 in [facility]  ?C-SSRS RISK CATEGORY No Risk  ? ?  ?  ? ?Review of Systems:  ?Review of Systems  ?Gastrointestinal:   ?     Notices GERD is worse with increased anxiety  ?Musculoskeletal:  Positive for back pain. Negative for gait problem.  ?Allergic/Immunologic: Positive for environmental allergies.  ?Neurological:  Positive for headaches. Negative for tremors.  ?Psychiatric/Behavioral:  Positive for depression.   ?  Please refer to HPI  ? ?Medications: I have reviewed the patient's current medications. ? ?Current Outpatient Medications  ?Medication Sig Dispense Refill  ? albuterol (VENTOLIN HFA) 108 (90 Base) MCG/ACT inhaler Inhale 2 puffs into the lungs every 6 (six) hours as needed. For wheeze or shortness of breath 8.5 g 2  ? amitriptyline (ELAVIL) 10 MG tablet TAKE  1 TABLET BY MOUTH EVERYDAY AT BEDTIME 90 tablet 1  ? amitriptyline (ELAVIL) 25 MG tablet Take 1 tablet (25 mg total) by mouth at bedtime. 30 tablet 2  ? cetirizine (ZYRTEC) 10 MG tablet Take 10 mg by mouth daily as needed.     ? clobetasol ointment (TEMOVATE) 0.26 % Apply 1 application. topically daily as needed. 30 g 1  ? esomeprazole (NEXIUM) 40 MG capsule Take 1 capsule (40 mg total) by mouth daily. 90 capsule 3  ? clonazePAM (KLONOPIN) 0.5 MG tablet Take 1 tablet (0.5 mg total) by mouth 2 (two) times daily as needed for anxiety. 60 tablet 2  ? ?Current Facility-Administered Medications  ?Medication Dose Route Frequency Provider Last Rate Last Admin  ? methylPREDNISolone acetate (DEPO-MEDROL) injection 80 mg  80 mg Other Once Magnus Sinning, MD      ? ? ?Medication Side Effects: None ? ?Allergies:  ?Allergies  ?Allergen Reactions  ? Doxycycline Nausea And Vomiting  ?  Other reaction(s): Vomiting  ? ? ?Past Medical History:  ?Diagnosis Date  ? Anxiety   ? Asthma   ? Constipation   ? Generalized headaches   ? Other and unspecified ovarian cysts   ? PONV (postoperative nausea and vomiting)   ? Pulse irregularity   ? Rapid heart rate   ? Seasonal allergies   ? ? ?Past Medical History, Surgical history, Social history, and Family history were reviewed and updated as appropriate.  ? ?Please see review of systems for further details on the patient's review from today.  ? ?Objective:  ? ?Physical Exam:  ?There were no vitals taken for this visit. ? ?Physical Exam ?Constitutional:   ?   General: She is not in acute distress. ?Musculoskeletal:     ?   General: No deformity.  ?Neurological:  ?   Mental Status: She is alert and oriented to person, place, and time.  ?   Coordination: Coordination normal.  ?Psychiatric:     ?   Attention and Perception: Attention and perception normal. She does not perceive auditory or visual hallucinations.     ?   Mood and Affect: Mood is anxious. Affect is not labile, blunt, angry or  inappropriate.     ?   Speech: Speech normal.     ?   Behavior: Behavior normal.     ?   Thought Content: Thought content normal. Thought content is not paranoid or delusional. Thought content does not include homicidal or suicidal ideation. Thought content does not include homicidal or suicidal plan.     ?   Cognition and Memory: Cognition and memory normal.     ?   Judgment: Judgment normal.  ?   Comments: Insight intact ?Dysthymic mood  ? ? ?Lab Review:  ?   ?Component Value Date/Time  ? NA 140 11/08/2021 1036  ? K 3.8 11/08/2021 1036  ? CL 106 11/08/2021 1036  ? CO2 24 11/08/2021 1036  ? GLUCOSE 93 11/08/2021 1036  ? BUN 7 11/08/2021 1036  ? CREATININE 0.65 11/08/2021 1036  ? CREATININE 0.69 10/28/2020 1118  ? CALCIUM 9.6 11/08/2021 1036  ? PROT  7.7 11/08/2021 1036  ? ALBUMIN 4.3 11/08/2021 1036  ? AST 17 11/08/2021 1036  ? ALT 14 11/08/2021 1036  ? ALKPHOS 69 11/08/2021 1036  ? BILITOT 0.5 11/08/2021 1036  ? GFRNONAA 109 10/28/2020 1118  ? GFRAA 126 10/28/2020 1118  ? ? ?   ?Component Value Date/Time  ? WBC 12.5 (H) 10/02/2012 0006  ? RBC 4.51 10/02/2012 0006  ? HGB 14.2 10/02/2012 0006  ? HCT 38.5 10/02/2012 0006  ? PLT 214 10/02/2012 0006  ? MCV 85.4 10/02/2012 0006  ? MCH 31.5 10/02/2012 0006  ? MCHC 36.9 (H) 10/02/2012 0006  ? RDW 12.0 10/02/2012 0006  ? ? ?No results found for: POCLITH, LITHIUM  ? ?No results found for: PHENYTOIN, PHENOBARB, VALPROATE, CBMZ  ? ?.res ?Assessment: Plan:   ?Pt seen for 30 minutes and time spent discussing possible treatment options for recent recurrence of panic s/s and mild depression. She reports that she has had side effects and limited improvement with multiple medications in the past and has some apprehension about starting a new medication. She asks about possible increase of amitriptyline to improve mood and anxiety since she is currently taking 10 mg dose for headaches. Will increase Amitriptyline to 25 mg po QHS to possibly improve mood and anxiety.  ?Continue  Klonopin 0.5 mg po BID prn anxiety and panic.  ?Discussed that it may be helpful to see therapist to process triggers to panic while driving.  ?Pt to follow-up in 6 months or sooner if clinically indicated.  ?Fraser Din

## 2022-03-26 ENCOUNTER — Other Ambulatory Visit: Payer: Self-pay | Admitting: Psychiatry

## 2022-03-26 DIAGNOSIS — F411 Generalized anxiety disorder: Secondary | ICD-10-CM

## 2022-03-26 DIAGNOSIS — F99 Mental disorder, not otherwise specified: Secondary | ICD-10-CM

## 2022-03-26 DIAGNOSIS — F39 Unspecified mood [affective] disorder: Secondary | ICD-10-CM

## 2022-03-26 DIAGNOSIS — F5105 Insomnia due to other mental disorder: Secondary | ICD-10-CM

## 2022-03-28 ENCOUNTER — Telehealth: Payer: Self-pay | Admitting: Psychiatry

## 2022-03-28 DIAGNOSIS — F410 Panic disorder [episodic paroxysmal anxiety] without agoraphobia: Secondary | ICD-10-CM

## 2022-03-28 MED ORDER — CLONIDINE HCL 0.1 MG PO TABS: ORAL_TABLET | ORAL | 2 refills | Status: DC

## 2022-03-28 NOTE — Telephone Encounter (Signed)
See message from patient. Patient had to travel the same route as when she had the last panic attack today. She said she took a Klonopin about 7:30 this morning and then took another one at about 9:30 when she had to leave home and it did not help. She said she has had "tons of therapy." She was seeing Earnest  for EMDR therapy and said she had gotten so busy with work the last 6 months that it has been difficult to schedule appts.  ?

## 2022-03-28 NOTE — Telephone Encounter (Signed)
Called pt. She reports that this is the 3rd time she has had a panic attack in the  [facility] vicinity where she had a previous panic attack. She has clamminess, sweating, heart racing, and a sensation in her head. She reports that she had some anticipatory anxiety last night thinking about driving to [facility] the next day. She repots that she has to drive to [facility] at least once a week for her work and is unable to avoid the area that has been triggering her panic.  ? ?Discussed SSRI's are typically considered first-line treatment for panic and that she has not responded to several SSRI's in the past. She has also tried other benzodiazepines in the past and Klonopin seems to be best tolerated and most effective. Discussed that she may want to take Klonopin the night before to help prevent anticipatory anxiety from escalating and could try taking Klonopin up to 1 mg when traveling to [place], where her panic typically occurs. Discussed option of re-starting Clonidine since this may have been helpful for her panic in the past. Will send script for Clonidine 0.1 mg 1/2 tab daily as needed. Discussed that she could take it only on the days she travels to [facility] or on a daily basis if helpful for her anxiety in general. Recommended first taking Clonidine 0.1 mg 1/2 tab in the morning on a day she is not driving to determine if this causes drowsiness. Discussed that EMDR may be helpful since panic has a specific trigger. Patient advised to contact office with any questions, adverse effects, or acute worsening in signs and symptoms. ? ?

## 2022-03-28 NOTE — Telephone Encounter (Signed)
Danielle [name] called because since she had the one severe panic attack while driving in [place], she now has them every time she is driving in the same area that the attack happened.  It is to the point that it is debilitating.  She is taking her Klonopin before she goes and has increased the dose of amitriptyline and this is not helping at all.  Do you have other suggestions?  This is her work and she has to drive to [place] at least once a week.  Is there another medication?  Please call to discuss.  [facility] [place] [facility] [address].  Next appt 05/27/22 ?Marland Kitchen ?

## 2022-03-28 NOTE — Telephone Encounter (Signed)
LVM to RC 

## 2022-05-27 ENCOUNTER — Ambulatory Visit: Payer: BC Managed Care – PPO | Admitting: Psychiatry

## 2022-05-27 ENCOUNTER — Encounter: Payer: Self-pay | Admitting: Psychiatry

## 2022-05-27 DIAGNOSIS — F410 Panic disorder [episodic paroxysmal anxiety] without agoraphobia: Secondary | ICD-10-CM | POA: Diagnosis not present

## 2022-05-27 DIAGNOSIS — F5105 Insomnia due to other mental disorder: Secondary | ICD-10-CM | POA: Diagnosis not present

## 2022-05-27 DIAGNOSIS — F411 Generalized anxiety disorder: Secondary | ICD-10-CM

## 2022-05-27 DIAGNOSIS — F39 Unspecified mood [affective] disorder: Secondary | ICD-10-CM

## 2022-05-27 DIAGNOSIS — F99 Mental disorder, not otherwise specified: Secondary | ICD-10-CM

## 2022-05-27 MED ORDER — AMITRIPTYLINE HCL 25 MG PO TABS: ORAL_TABLET | ORAL | 1 refills | Status: DC

## 2022-05-27 MED ORDER — CLONAZEPAM 0.5 MG PO TABS: 0.5000 mg | ORAL_TABLET | Freq: Two times a day (BID) | ORAL | 3 refills | Status: DC | PRN

## 2022-05-27 NOTE — Progress Notes (Signed)
Danielle [name] [id number] [birth year] 41 y.o.  Subjective:   Patient ID:  Danielle [name] is a 41 y.o. (DOB 1981/07/21) female.  Chief Complaint:  Chief Complaint  Patient presents with   Panic Attack   Follow-up    Anxiety, depression    HPI Danielle [name] presents to the office today for follow-up of anxiety and mood disturbance. She reports that she continues to have an aversion about driving in certain areas and highways in [place]. She attempted to drive this route again several times and had panic symptoms each time. She has been taking back roads to get to [facility] ad sometimes has anxiety when she sees signs for certain highways. She reports that her work knows that she has anxiety with driving to [place]. She reports occasional "flashes" of panic, usually in situations where she feels trapped, such as a concert. She reports that her baseline anxiety is currently higher than normal. More worry and anxious thoughts- "what if.Marland KitchenMarland Kitchen?" Some catastrophic thinking and rumination. Some worry about depression since this followed panic in the past.   She reports that her mood has been "pretty level." Denies depressed mood or excessive irritability. Sleeping well. Energy and motivation have been "ok, I wish there was more of it." Has wanted to start walking again. Concentration has been ok. Denis anhedonia. Denies SI.   Works as a Emergency planning/management officer.   Son just turned 60 years old and has not had a good school year. Reports that son has been "moody" and angry at times. One of her childhood friends has been suicidal.   Taking Klonopin BID prn most days. She has tried taking Klonopin two tabs when she had to drive in the area where she has had panic.   Past Psychiatric Medication Trials: Abilify- Tremor Latuda Vraylar- Irritability, Agitation Rexulti- Palpitations Lamictal Viibryd- panic Trintellix- HA's, possible headaches. Prozac Effexor  XR Zoloft Lexapro Wellbutrin- Headaches Buspar Xanax- excessive somnolence, ineffective Clorazepate- ineffective Klonopin Ativan Trileptal-ineffective Trazodone- Has had dizziness and nausea with doses above 100 mg.  Deplin Propranolol-Ineffective Clonidine- May have been helpful for panic s/s. Caused drowsiness.  Gabapentin- Noticed restlessness with 900 mg.  Amitriptyline- Helpful for headaches and mood.     [place] Office Visit from 11/08/2021 in [place] [facility] [place] from 01/10/2017 in [place] at [place] from 10/07/2016 in [facility] at [place] from 05/13/2016 in [facility] at [facility] Total Score 2 0 1 0  PHQ-9 Total Score 7 -- -- --      Flowsheet Row Admission (Discharged) from 11/18/2021 in [place] No Risk        Review of Systems:  Review of Systems  Gastrointestinal: Negative.   Musculoskeletal:  Positive for back pain. Negative for gait problem.  Neurological:  Negative for tremors and headaches.  Psychiatric/Behavioral:         Please refer to HPI    Medications: I have reviewed the patient's current medications.  Current Outpatient Medications  Medication Sig Dispense Refill   albuterol (VENTOLIN HFA) 108 (90 Base) MCG/ACT inhaler Inhale 2 puffs into the lungs every 6 (six) hours as needed. For wheeze or shortness of breath 8.5 g 2   cetirizine (ZYRTEC) 10 MG tablet Take 10 mg by mouth daily as needed.      clobetasol ointment (TEMOVATE) 5.18 % Apply 1 application. topically daily as needed. 30 g 1   esomeprazole (NEXIUM) 40  MG capsule Take 1 capsule (40 mg total) by mouth daily. 90 capsule 3   amitriptyline (ELAVIL) 10 MG tablet TAKE 1 TABLET BY MOUTH EVERYDAY AT BEDTIME (Patient not taking: Reported on 05/27/2022) 90 tablet 1   amitriptyline (ELAVIL) 25 MG tablet TAKE 1 TABLET BY MOUTH EVERYDAY AT BEDTIME 90 tablet 1   [START ON 06/24/2022] clonazePAM  (KLONOPIN) 0.5 MG tablet Take 1 tablet (0.5 mg total) by mouth 2 (two) times daily as needed for anxiety. 60 tablet 3   Current Facility-Administered Medications  Medication Dose Route Frequency Provider Last Rate Last Admin   methylPREDNISolone acetate (DEPO-MEDROL) injection 80 mg  80 mg Other Once Magnus Sinning, MD        Medication Side Effects: None  Allergies:  Allergies  Allergen Reactions   Doxycycline Nausea And Vomiting    Other reaction(s): Vomiting    Past Medical History:  Diagnosis Date   Anxiety    Asthma    Constipation    Generalized headaches    Other and unspecified ovarian cysts    PONV (postoperative nausea and vomiting)    Pulse irregularity    Rapid heart rate    Seasonal allergies     Past Medical History, Surgical history, Social history, and Family history were reviewed and updated as appropriate.   Please see review of systems for further details on the patient's review from today.   Objective:   Physical Exam:  There were no vitals taken for this visit.  Physical Exam Constitutional:      General: She is not in acute distress. Musculoskeletal:        General: No deformity.  Neurological:     Mental Status: She is alert and oriented to person, place, and time.     Coordination: Coordination normal.  Psychiatric:        Attention and Perception: Attention and perception normal. She does not perceive auditory or visual hallucinations.        Mood and Affect: Mood is anxious. Mood is not depressed. Affect is not labile, blunt, angry or inappropriate.        Speech: Speech normal.        Behavior: Behavior normal.        Thought Content: Thought content normal. Thought content is not paranoid or delusional. Thought content does not include homicidal or suicidal ideation. Thought content does not include homicidal or suicidal plan.        Cognition and Memory: Cognition and memory normal.        Judgment: Judgment normal.     Comments:  Insight intact     Lab Review:     Component Value Date/Time   NA 140 11/08/2021 1036   K 3.8 11/08/2021 1036   CL 106 11/08/2021 1036   CO2 24 11/08/2021 1036   GLUCOSE 93 11/08/2021 1036   BUN 7 11/08/2021 1036   CREATININE 0.65 11/08/2021 1036   CREATININE 0.69 10/28/2020 1118   CALCIUM 9.6 11/08/2021 1036   PROT 7.7 11/08/2021 1036   ALBUMIN 4.3 11/08/2021 1036   AST 17 11/08/2021 1036   ALT 14 11/08/2021 1036   ALKPHOS 69 11/08/2021 1036   BILITOT 0.5 11/08/2021 1036   GFRNONAA 109 10/28/2020 1118   GFRAA 126 10/28/2020 1118       Component Value Date/Time   WBC 12.5 (H) 10/02/2012 0006   RBC 4.51 10/02/2012 0006   HGB 14.2 10/02/2012 0006   HCT 38.5 10/02/2012 0006   PLT 214 10/02/2012  0006   MCV 85.4 10/02/2012 0006   MCH 31.5 10/02/2012 0006   MCHC 36.9 (H) 10/02/2012 0006   RDW 12.0 10/02/2012 0006    No results found for: "POCLITH", "LITHIUM"   No results found for: "PHENYTOIN", "PHENOBARB", "VALPROATE", "CBMZ"   .res Assessment: Plan:    Discussed recent increased anxiety and possible treatment options. She reports that she prefers not to start any new medications at this time. She plans to try to NAC and L-threonate. Discussed that therapy may be helpful for panic symptoms if her schedule allows.  Will continue Elavil 25 mg po QHS for mood and anxiety symptoms.  Continue Klonopin 0.5 mg po BID prn anxiety.  Pt to follow-up in 6 months or sooner if clinically indicated.  Patient advised to contact office with any questions, adverse effects, or acute worsening in signs and symptoms.    Tenisha was seen today for panic attack and follow-up.  Diagnoses and all orders for this visit:  Panic disorder -     clonazePAM (KLONOPIN) 0.5 MG tablet; Take 1 tablet (0.5 mg total) by mouth 2 (two) times daily as needed for anxiety.  Insomnia due to other mental disorder -     amitriptyline (ELAVIL) 25 MG tablet; TAKE 1 TABLET BY MOUTH EVERYDAY AT BEDTIME -      clonazePAM (KLONOPIN) 0.5 MG tablet; Take 1 tablet (0.5 mg total) by mouth 2 (two) times daily as needed for anxiety.  Generalized anxiety disorder -     amitriptyline (ELAVIL) 25 MG tablet; TAKE 1 TABLET BY MOUTH EVERYDAY AT BEDTIME -     clonazePAM (KLONOPIN) 0.5 MG tablet; Take 1 tablet (0.5 mg total) by mouth 2 (two) times daily as needed for anxiety.  Mood disorder (HCC) -     amitriptyline (ELAVIL) 25 MG tablet; TAKE 1 TABLET BY MOUTH EVERYDAY AT BEDTIME     Please see After Visit Summary for patient specific instructions.  Future Appointments  Date Time Provider [place]  11/24/2022  9:30 AM Thayer Headings, PMHNP CP-CP None     No orders of the defined types were placed in this encounter.   -------------------------------

## 2022-05-31 DIAGNOSIS — Z01419 Encounter for gynecological examination (general) (routine) without abnormal findings: Secondary | ICD-10-CM | POA: Diagnosis not present

## 2022-05-31 DIAGNOSIS — Z6832 Body mass index (BMI) 32.0-32.9, adult: Secondary | ICD-10-CM | POA: Diagnosis not present

## 2022-07-01 NOTE — Progress Notes (Signed)
ACUTE VISIT Chief Complaint  Patient presents with   stomach pains    About 3 weeks now, happens usually after heavier meals.   Diarrhea    About 3 weeks now, and having looser stools. Usually is more on the side of constipation - started about the same time as the stomach pains.    HPI: [name] is a 41 y.o. female with hx of asthma,mood disorder,anxiety,and migraine headaches here today complaining of periumbilical abdominal cramps and diarrhea as described above. Symptoms started while she was on the beach and after eating certain foods. Loose stools, preceding by crams, temporarily alleviated after defecation. It is more often when eating out and greasy foods but lately it has happened after small bites or snacking.  When she feels the urge she has to go, afraid of having an episode of stool incontinence.She has had to stop at work or go back home to use the bathroom while visiting clients.  This is affecting her daily activities, aggravating anxiety. She is avoiding long trips thinking she may have symptoms while driving with no bathroom availability.  Diarrhea  This is a new problem. The current episode started 1 to 4 weeks ago. The problem has been unchanged. The stool consistency is described as Watery. The patient states that diarrhea does not awaken her from sleep. Associated symptoms include abdominal pain. Pertinent negatives include no chills, coughing, fever, headaches, increased  flatus, myalgias, sweats, URI, vomiting or weight loss. She has tried nothing for the symptoms. Her past medical history is significant for irritable bowel syndrome. There is no history of bowel resection, inflammatory bowel disease, malabsorption, a recent abdominal surgery or short gut syndrome.   Her brother has had some GI issues and possible UC.  No recent travel, new medications,or dietary changes. No abx use in the past couple months. She has not tried OTC  treatment.  Similar symptoms at age 49 and was Dx'ed with IBS.  Frustrated with not been able to lose wt. She is trying to eat healthier. Not exercising regularly but active in general.  Review of Systems  Constitutional:  Positive for appetite change and fatigue. Negative for chills, fever, unexpected weight change and weight loss.  HENT:  Negative for congestion, sore throat and trouble swallowing.   Respiratory:  Negative for cough, shortness of breath and wheezing.   Cardiovascular:  Negative for chest pain and palpitations.  Gastrointestinal:  Positive for abdominal pain and diarrhea. Negative for blood in stool, flatus, nausea and vomiting.  Endocrine: Negative for cold intolerance.  Genitourinary:  Negative for decreased urine volume, dysuria and hematuria.  Musculoskeletal:  Negative for gait problem and myalgias.  Skin:  Negative for rash.  Neurological:  Negative for syncope, weakness and headaches.  Hematological:  Negative for adenopathy. Does not bruise/bleed easily.  Psychiatric/Behavioral:  Negative for confusion. The patient is nervous/anxious.   Rest see pertinent positives and negatives per HPI.  Current Outpatient Medications on File Prior to Visit  Medication Sig Dispense Refill   albuterol (VENTOLIN HFA) 108 (90 Base) MCG/ACT inhaler Inhale 2 puffs into the lungs every 6 (six) hours as needed. For wheeze or shortness of breath 8.5 g 2   amitriptyline (ELAVIL) 25 MG tablet TAKE 1 TABLET BY MOUTH EVERYDAY AT BEDTIME 90 tablet 1   cetirizine (ZYRTEC) 10 MG tablet Take 10 mg by mouth daily as needed.      clobetasol ointment (TEMOVATE) 0.05 % Apply 1 application. topically daily as needed. 30 g  1   clonazePAM (KLONOPIN) 0.5 MG tablet Take 1 tablet (0.5 mg total) by mouth 2 (two) times daily as needed for anxiety. 60 tablet 3   esomeprazole (NEXIUM) 40 MG capsule Take 1 capsule (40 mg total) by mouth daily. 90 capsule 3   No current facility-administered medications on  file prior to visit.   Past Medical History:  Diagnosis Date   Anxiety    Asthma    Constipation    Generalized headaches    Other and unspecified ovarian cysts    PONV (postoperative nausea and vomiting)    Pulse irregularity    Rapid heart rate    Seasonal allergies    Allergies  Allergen Reactions   Doxycycline Nausea And Vomiting    Other reaction(s): Vomiting   Social History   Socioeconomic History   Marital status: Married    Spouse name: Not on file   Number of children: 2   Years of education: Not on file   Highest education level: Associate degree: occupational, Scientist, product/process development, or vocational program  Occupational History   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  Tobacco Use   Smoking status: Never   Smokeless tobacco: Never  Vaping Use   Vaping Use: Never used  Substance and Sexual Activity   Alcohol use: Yes    Alcohol/week: 1.0 standard drink of alcohol    Types: 1 Standard drinks or equivalent per week    Comment: occasionally   Drug use: No   Sexual activity: Not on file  Other Topics Concern   Not on file  Social History Narrative   Not on file   Social Determinants of Health   Financial Resource Strain: Low Risk  (07/01/2022)   Overall Financial Resource Strain (CARDIA)    Difficulty of Paying Living Expenses: Not hard at all  Food Insecurity: No Food Insecurity (07/01/2022)   Hunger Vital Sign    Worried About Running Out of Food in the Last Year: Never true    Ran Out of Food in the Last Year: Never true  Transportation Needs: No Transportation Needs (07/01/2022)   PRAPARE - Administrator, Civil Service (Medical): No    Lack of Transportation (Non-Medical): No  Physical Activity: Insufficiently Active (07/01/2022)   Exercise Vital Sign    Days of Exercise per Week: 2 days    Minutes of Exercise per Session: 20 min  Stress: Stress Concern Present (07/01/2022)   [employer] of Occupational Health - Occupational Stress Questionnaire     Feeling of Stress : Rather much  Social Connections: Moderately Isolated (07/01/2022)   Social Connection and Isolation Panel [NHANES]    Frequency of Communication with Friends and Family: More than three times a week    Frequency of Social Gatherings with Friends and Family: Twice a week    Attends Religious Services: Never    Database administrator or Organizations: No    Attends Banker Meetings: Not on file    Marital Status: Married   Vitals:   07/04/22 0957  BP: 126/70  Pulse: 93  Resp: 12  Temp: 98.7 F (37.1 C)  SpO2: 98%   Wt Readings from Last 3 Encounters:  07/04/22 181 lb 8 oz (82.3 kg)  11/18/21 176 lb 5.9 oz (80 kg)  11/08/21 179 lb 12.8 oz (81.6 kg)   Body mass index is 34.29 kg/m.  Physical Exam Vitals and nursing note reviewed.  Constitutional:      General: She is not in  acute distress.    Appearance: She is well-developed. She is not ill-appearing.  HENT:     Head: Normocephalic and atraumatic.     Mouth/Throat:     Mouth: Mucous membranes are moist.     Pharynx: Oropharynx is clear.  Eyes:     General: No scleral icterus.    Conjunctiva/sclera: Conjunctivae normal.  Cardiovascular:     Rate and Rhythm: Normal rate and regular rhythm.     Heart sounds: No murmur heard. Pulmonary:     Effort: Pulmonary effort is normal. No respiratory distress.     Breath sounds: Normal breath sounds.  Abdominal:     General: Bowel sounds are normal. There is no distension.     Palpations: Abdomen is soft. There is no hepatomegaly or mass.     Tenderness: There is no abdominal tenderness.  Lymphadenopathy:     Cervical: No cervical adenopathy.  Skin:    General: Skin is warm.     Findings: No erythema or rash.  Neurological:     Mental Status: She is alert and oriented to person, place, and time.  Psychiatric:        Mood and Affect: Mood is anxious.     Comments: Well groomed, good eye contact.   ASSESSMENT AND PLAN:  Ms.Dosia was seen  today for stomach pains and diarrhea.  Diagnoses and all orders for this visit: Orders Placed This Encounter  Procedures   Stool culture   Ova and parasite examination   Comprehensive metabolic panel   CBC   TSH   C-reactive protein   C. difficile GDH and Toxin A/B   Amb Ref to Medical Weight Management   Lab Results  Component Value Date   TSH 1.66 07/04/2022   Lab Results  Component Value Date   WBC 8.1 07/04/2022   HGB 13.4 07/04/2022   HCT 38.8 07/04/2022   MCV 87.9 07/04/2022   PLT 260.0 07/04/2022   Lab Results  Component Value Date   CRP 1.1 07/04/2022   Lab Results  Component Value Date   CREATININE 0.65 07/04/2022   BUN 8 07/04/2022   NA 136 07/04/2022   K 4.1 07/04/2022   CL 105 07/04/2022   CO2 23 07/04/2022   Lab Results  Component Value Date   ALT 16 07/04/2022   AST 20 07/04/2022   ALKPHOS 71 07/04/2022   BILITOT 0.6 07/04/2022   Abdominal pain, periumbilical We discussed possible etiologies. Hx and examination do not suggest a serious process.  I do not think imaging is needed at this time. Instructed about warning signs.  Diarrhea, unspecified type IBS among some to consider, IBD is less likely. Will obtain stool samples and blood work today. Continue avoiding trigger factors. Adequate hydration. If persistent GI evaluation will be considered.  Obesity, Class I, BMI 30-34.9 She understands the benefits of wt loss as well as adverse effects of obesity. Some of her medications can be contributing factors. Consistency with healthy diet and physical activity encouraged. Making positive life style changes, small steps at the time, will provide long lasting results with minimal risk for adverse effects; so I do not recommend pharmacologic treatment. Referral to healthy wt clinic placed.  Return if symptoms worsen or fail to improve.  Taijah Macrae G. [place], MD  [facility]. [place] office.

## 2022-07-04 ENCOUNTER — Ambulatory Visit: Payer: BC Managed Care – PPO | Admitting: Family Medicine

## 2022-07-04 ENCOUNTER — Encounter: Payer: Self-pay | Admitting: Family Medicine

## 2022-07-04 VITALS — BP 126/70 | HR 93 | Temp 98.7°F | Resp 12 | Ht 61.0 in | Wt 181.5 lb

## 2022-07-04 DIAGNOSIS — E66811 Obesity, class 1: Secondary | ICD-10-CM | POA: Insufficient documentation

## 2022-07-04 DIAGNOSIS — E669 Obesity, unspecified: Secondary | ICD-10-CM | POA: Diagnosis not present

## 2022-07-04 DIAGNOSIS — R1033 Periumbilical pain: Secondary | ICD-10-CM | POA: Diagnosis not present

## 2022-07-04 DIAGNOSIS — R197 Diarrhea, unspecified: Secondary | ICD-10-CM | POA: Diagnosis not present

## 2022-07-04 LAB — COMPREHENSIVE METABOLIC PANEL
ALT: 16 U/L (ref 0–35)
AST: 20 U/L (ref 0–37)
Albumin: 4.4 g/dL (ref 3.5–5.2)
Alkaline Phosphatase: 71 U/L (ref 39–117)
BUN: 8 mg/dL (ref 6–23)
CO2: 23 mEq/L (ref 19–32)
Calcium: 9.5 mg/dL (ref 8.4–10.5)
Chloride: 105 mEq/L (ref 96–112)
Creatinine, Ser: 0.65 mg/dL (ref 0.40–1.20)
GFR: 108.42 mL/min (ref >60.00)
Glucose, Bld: 89 mg/dL (ref 70–99)
Potassium: 4.1 mEq/L (ref 3.5–5.1)
Sodium: 136 mEq/L (ref 135–145)
Total Bilirubin: 0.6 mg/dL (ref 0.2–1.2)
Total Protein: 7.6 g/dL (ref 6.0–8.3)

## 2022-07-04 LAB — CBC
HCT: 38.8 % (ref 36.0–46.0)
Hemoglobin: 13.4 g/dL (ref 12.0–15.0)
MCHC: 34.6 g/dL (ref 30.0–36.0)
MCV: 87.9 fl (ref 78.0–100.0)
Platelets: 260 10*3/uL (ref 150.0–400.0)
RBC: 4.42 Mil/uL (ref 3.87–5.11)
RDW: 12.9 % (ref 11.5–15.5)
WBC: 8.1 10*3/uL (ref 4.0–10.5)

## 2022-07-04 LAB — C-REACTIVE PROTEIN: CRP: 1.1 mg/dL (ref 0.5–20.0)

## 2022-07-04 LAB — TSH: TSH: 1.66 u[IU]/mL (ref 0.35–5.50)

## 2022-07-04 NOTE — Patient Instructions (Addendum)
A few things to remember from today's visit:  Diarrhea, unspecified type - Plan: Comprehensive metabolic panel, CBC, TSH, C-reactive protein, C. difficile GDH and Toxin A/B, Stool culture, Ova and parasite examination  Abdominal pain, periumbilical - Plan: Comprehensive metabolic panel, CBC, TSH, C-reactive protein  Obesity, Class I, BMI 30-34.9 - Plan: Amb Ref to Medical Weight Management  If you need refills please call your pharmacy. Do not use My Chart to request refills or for acute issues that need immediate attention.  Try to avoid trigger factors. If problem is persistent, gastroenterologist evaluation will be requested.  Please be sure medication list is accurate. If a new problem present, please set up appointment sooner than planned today. Diarrhea, Adult Diarrhea is frequent loose and watery bowel movements. Diarrhea can make you feel weak and cause you to become dehydrated. Dehydration can make you tired and thirsty, cause you to have a dry mouth, and decrease how often you urinate. Diarrhea typically lasts 2-3 days. However, it can last longer if it is a sign of something more serious. It is important to treat your diarrhea as told by your health care provider. Follow these instructions at home: Eating and drinking     Follow these recommendations as told by your health care provider: Take an oral rehydration solution (ORS). This is an over-the-counter medicine that helps return your body to its normal balance of nutrients and water. It is found at pharmacies and retail stores. Drink plenty of fluids, such as water, ice chips, diluted fruit juice, and low-calorie sports drinks. You can drink milk also, if desired. Avoid drinking fluids that contain a lot of sugar or caffeine, such as energy drinks, sports drinks, and soda. Eat bland, easy-to-digest foods in small amounts as you are able. These foods include bananas, applesauce, rice, lean meats, toast, and crackers. Avoid  alcohol. Avoid spicy or fatty foods.  Medicines Take over-the-counter and prescription medicines only as told by your health care provider. If you were prescribed an antibiotic medicine, take it as told by your health care provider. Do not stop using the antibiotic even if you start to feel better. General instructions  Wash your hands often using soap and water. If soap and water are not available, use a hand sanitizer. Others in the household should wash their hands as well. Hands should be washed: After using the toilet or changing a diaper. Before preparing, cooking, or serving food. While caring for a sick person or while visiting someone in a hospital. Drink enough fluid to keep your urine pale yellow. Rest at home while you recover. Watch your condition for any changes. Take a warm bath to relieve any burning or pain from frequent diarrhea episodes. Keep all follow-up visits as told by your health care provider. This is important. Contact a health care provider if: You have a fever. Your diarrhea gets worse. You have new symptoms. You cannot keep fluids down. You feel light-headed or dizzy. You have a headache. You have muscle cramps. Get help right away if: You have chest pain. You feel extremely weak or you faint. You have bloody or black stools or stools that look like tar. You have severe pain, cramping, or bloating in your abdomen. You have trouble breathing or you are breathing very quickly. Your heart is beating very quickly. Your skin feels cold and clammy. You feel confused. You have signs of dehydration, such as: Dark urine, very little urine, or no urine. Cracked lips. Dry mouth. Sunken eyes. Sleepiness. Weakness.  Summary Diarrhea is frequent loose and sometimes watery bowel movements. Diarrhea can make you feel weak and cause you to become dehydrated. Drink enough fluids to keep your urine pale yellow. Make sure that you wash your hands after using the  toilet. If soap and water are not available, use hand sanitizer. Contact a health care provider if your diarrhea gets worse or you have new symptoms. Get help right away if you have signs of dehydration. This information is not intended to replace advice given to you by your health care provider. Make sure you discuss any questions you have with your health care provider. Document Revised: 06/09/2021 Document Reviewed: 06/09/2021 Elsevier Patient Education  [place].

## 2022-07-04 NOTE — Assessment & Plan Note (Addendum)
She understands the benefits of wt loss as well as adverse effects of obesity. Consistency with healthy diet and physical activity encouraged. Making positive life style changes, small steps at the time, will provide long lasting results with minimal risk for adverse effects; so I do not recommend pharmacologic treatment.  She agrees with healthy wt clinic referral.

## 2022-09-02 DIAGNOSIS — Z1231 Encounter for screening mammogram for malignant neoplasm of breast: Secondary | ICD-10-CM | POA: Diagnosis not present

## 2022-09-27 ENCOUNTER — Other Ambulatory Visit: Payer: BC Managed Care – PPO

## 2022-10-04 ENCOUNTER — Other Ambulatory Visit: Payer: BC Managed Care – PPO

## 2022-10-04 ENCOUNTER — Telehealth: Payer: Self-pay | Admitting: Psychiatry

## 2022-10-04 DIAGNOSIS — R197 Diarrhea, unspecified: Secondary | ICD-10-CM

## 2022-10-04 NOTE — Telephone Encounter (Signed)
Pt was taking the hydroxyzine from another dr for headaches.She stopped taking it about 3 weeks ago and does not want to restart it because she believes it is hindering hair growth.Since stopping anxiety has gotten worse.She has been having a lot of bad panic attacks.She does take klonopin but it has not been helpful lately for the panic attacks.

## 2022-10-04 NOTE — Telephone Encounter (Signed)
LVM to rtc 

## 2022-10-04 NOTE — Telephone Encounter (Signed)
Next appt is 11/24/22. Danielle [name] states that the Amitriptyline she takes for anxiety and panic wasn't helping her. She discontinued it three weeks ago. She doesn't want to go on it again. Is there something else she can take different?

## 2022-10-05 NOTE — Telephone Encounter (Signed)
Pt is coming in tomorrow at 2 pm

## 2022-10-06 ENCOUNTER — Encounter: Payer: Self-pay | Admitting: Psychiatry

## 2022-10-06 ENCOUNTER — Ambulatory Visit: Payer: BC Managed Care – PPO | Admitting: Psychiatry

## 2022-10-06 DIAGNOSIS — F411 Generalized anxiety disorder: Secondary | ICD-10-CM | POA: Diagnosis not present

## 2022-10-06 DIAGNOSIS — F99 Mental disorder, not otherwise specified: Secondary | ICD-10-CM | POA: Diagnosis not present

## 2022-10-06 DIAGNOSIS — F39 Unspecified mood [affective] disorder: Secondary | ICD-10-CM

## 2022-10-06 DIAGNOSIS — F5105 Insomnia due to other mental disorder: Secondary | ICD-10-CM | POA: Diagnosis not present

## 2022-10-06 MED ORDER — DESVENLAFAXINE SUCCINATE ER 25 MG PO TB24: 25.0000 mg | ORAL_TABLET | Freq: Every day | ORAL | 1 refills | Status: DC

## 2022-10-06 NOTE — Progress Notes (Signed)
Danielle [name] [id number] Mar 30, 1981 41 y.o.  Subjective:   Patient ID:  Danielle [name] is a 41 y.o. (DOB 1981/03/08) female.  Chief Complaint:  Chief Complaint  Patient presents with   Anxiety   Depression    HPI Danielle [name] presents to the office today for follow-up of anxiety and depression. She reports that she has been having severe anxiety and panic recently.   She reports that she has been having changes in her hair growth and read where Amitriptyline could cause hair loss, and about 4 weeks ago she decided to reduce her dose from 25 mg to 1/2 tab for one week, and then stopped it.   She reports that they were traveling this weekend for her child's sporting event. She reports that they were stuck in traffic and she had anxiety with seeing their ETA turn red and increase. She reports that she then had a severe panic attack and wanted to run out of the car. She had increased HR, nausea, shortness of breath, etc. She reports that this Monday she was dropping her son off and had panic when getting ready to merge into traffic and felt "frozen... paralyzed." She now is experiencing some anxiety with getting on ramps. Prior to stopping Amitriptyline, she was continuing to take alternate routes through a certain area despite this taking significantly longer to reach her destination. She reports that before stopping Amitriptyline she had some generalized anxiety. She reports that she likes things done a certain way and is "a little OCD" about work.  She reports that she has "waves" of improved mood with improved energy and motivation and also "lulls" where mood, energy, and motivation are lower. She estimates depression lasts for a few weeks and mood will shift again a few months later.   Sleep has been "ok." She reports feeling tired "all the time." Wake up at the same time every day and does not feel rested upon awakening. Appetite is "the same." She reports concerns about  weight gain over the last 7 years. She reports that she has been unable to lose weight trying different strategies. Concentration has been "ok for the most part" with occ days of decreased concentration. Denies SI.   She reports feeling as if she is being trapped is a trigger to panic. She reports that she will plan out how she can exit and does not like events that are difficult to leave.   She reports that Amitriptyline was helpful for her headaches.   Work has been very busy. Daughter has been having some difficulties. Daughter is in her senior year of high school. Son is 34 yo and they are traveling often for hockey.   Past Psychiatric Medication Trials: Abilify- Tremor Latuda Vraylar- Irritability, Agitation Rexulti- Palpitations Lamictal Viibryd- panic Trintellix- HA's, possible headaches. Prozac Effexor XR- Caused manic s/s.  Zoloft Lexapro Wellbutrin- Headaches Buspar Xanax- excessive somnolence, ineffective Clorazepate- ineffective Klonopin Ativan Trileptal-ineffective Trazodone- Has had dizziness and nausea with doses above 100 mg.  Deplin Propranolol-Ineffective Clonidine- May have been helpful for panic s/s. Caused drowsiness.  Gabapentin- Noticed restlessness with 900 mg.  Amitriptyline- Helpful for headaches and mood.        AUDIT    Flowsheet Row Office Visit from 07/04/2022 in [place] at [place]  Alcohol Use Disorder Identification Test Final Score (AUDIT) 3      PHQ2-9    [place] Office Visit from 07/04/2022 in [place] at [facility] from 11/08/2021 in [facility]  at [place] from 01/10/2017 in [facility] at [place] from 10/07/2016 in [facility] at [place] from 05/13/2016 in [facility] at [facility] Total Score 0 2 0 1 0  PHQ-9 Total Score 2 7 -- -- --      Flowsheet Row Admission (Discharged) from 11/18/2021 in [place] No Risk         Review of Systems:  Review of Systems  Gastrointestinal:  Positive for abdominal pain and diarrhea.  Musculoskeletal:  Negative for gait problem.  Neurological:        Rare headaches  Psychiatric/Behavioral:         Please refer to HPI    Medications: I have reviewed the patient's current medications.  Current Outpatient Medications  Medication Sig Dispense Refill   albuterol (VENTOLIN HFA) 108 (90 Base) MCG/ACT inhaler Inhale 2 puffs into the lungs every 6 (six) hours as needed. For wheeze or shortness of breath 8.5 g 2   cetirizine (ZYRTEC) 10 MG tablet Take 10 mg by mouth daily as needed.      desvenlafaxine (PRISTIQ) 25 MG 24 hr tablet Take 1 tablet (25 mg total) by mouth daily. 30 tablet 1   esomeprazole (NEXIUM) 40 MG capsule Take 1 capsule (40 mg total) by mouth daily. 90 capsule 3   amitriptyline (ELAVIL) 25 MG tablet TAKE 1 TABLET BY MOUTH EVERYDAY AT BEDTIME (Patient not taking: Reported on 10/06/2022) 90 tablet 1   clobetasol ointment (TEMOVATE) 2.95 % Apply 1 application. topically daily as needed. 30 g 1   clonazePAM (KLONOPIN) 0.5 MG tablet Take 1 tablet (0.5 mg total) by mouth 2 (two) times daily as needed for anxiety. 60 tablet 3   No current facility-administered medications for this visit.    Medication Side Effects: Other: Possible change in hair with Amitriptyline  Allergies:  Allergies  Allergen Reactions   Doxycycline Nausea And Vomiting    Other reaction(s): Vomiting    Past Medical History:  Diagnosis Date   Anxiety    Asthma    Constipation    Generalized headaches    Other and unspecified ovarian cysts    PONV (postoperative nausea and vomiting)    Pulse irregularity    Rapid heart rate    Seasonal allergies     Past Medical History, Surgical history, Social history, and Family history were reviewed and updated as appropriate.   Please see review of systems for further details on the patient's review from today.   Objective:    Physical Exam:  There were no vitals taken for this visit.  Physical Exam Constitutional:      General: She is not in acute distress. Musculoskeletal:        General: No deformity.  Neurological:     Mental Status: She is alert and oriented to person, place, and time.     Coordination: Coordination normal.  Psychiatric:        Attention and Perception: Attention and perception normal. She does not perceive auditory or visual hallucinations.        Mood and Affect: Mood is anxious. Affect is not labile, blunt, angry or inappropriate.        Speech: Speech normal.        Behavior: Behavior normal.        Thought Content: Thought content normal. Thought content is not paranoid or delusional. Thought content does not include homicidal or suicidal ideation. Thought content does not include homicidal  or suicidal plan.        Cognition and Memory: Cognition and memory normal.        Judgment: Judgment normal.     Comments: Insight intact Mood is mildly depressed    Lab Review:     Component Value Date/Time   NA 136 07/04/2022 1032   K 4.1 07/04/2022 1032   CL 105 07/04/2022 1032   CO2 23 07/04/2022 1032   GLUCOSE 89 07/04/2022 1032   BUN 8 07/04/2022 1032   CREATININE 0.65 07/04/2022 1032   CREATININE 0.69 10/28/2020 1118   CALCIUM 9.5 07/04/2022 1032   PROT 7.6 07/04/2022 1032   ALBUMIN 4.4 07/04/2022 1032   AST 20 07/04/2022 1032   ALT 16 07/04/2022 1032   ALKPHOS 71 07/04/2022 1032   BILITOT 0.6 07/04/2022 1032   GFRNONAA 109 10/28/2020 1118   GFRAA 126 10/28/2020 1118       Component Value Date/Time   WBC 8.1 07/04/2022 1032   RBC 4.42 07/04/2022 1032   HGB 13.4 07/04/2022 1032   HCT 38.8 07/04/2022 1032   PLT 260.0 07/04/2022 1032   MCV 87.9 07/04/2022 1032   MCH 31.5 10/02/2012 0006   MCHC 34.6 07/04/2022 1032   RDW 12.9 07/04/2022 1032    No results found for: "POCLITH", "LITHIUM"   No results found for: "PHENYTOIN", "PHENOBARB", "VALPROATE", "CBMZ"    .res Assessment: Plan:   Pt seen for 30 minutes and time spent discussing treatment options for anxiety and depression with consideration of past treatment responses and available from child's pharmacogenetic testing results. Discussed potential benefits, risks and side effects of various treatment options to include restarting Amitriptyline or another tricyclic antidepressant, Pristiq, Auvelity, or Vraylar. Discussed that family pharmacogenetic testing and her treatment responses indicate that she is unlikely to respond to an SSRI, and therefore recommend considering treatment with an alternative mechanism of action. She agrees to trial of Pristiq. Will start Pristiq 25 mg daily for depression and anxiety.  Continue Klonopin 0.5 mg po BID prn anxiety.  Pt to follow-up with this provider in 6 weeks or sooner if clinically indicated.  Patient advised to contact office with any questions, adverse effects, or acute worsening in signs and symptoms.   Danielle [name] was seen today for anxiety and depression.  Diagnoses and all orders for this visit:  Generalized anxiety disorder -     desvenlafaxine (PRISTIQ) 25 MG 24 hr tablet; Take 1 tablet (25 mg total) by mouth daily.  Mood disorder (HCC) -     desvenlafaxine (PRISTIQ) 25 MG 24 hr tablet; Take 1 tablet (25 mg total) by mouth daily.  Insomnia due to other mental disorder     Please see After Visit Summary for patient specific instructions.  Future Appointments  Date Time Provider [place]  11/09/2022  8:30 AM Martinique, Betty G, MD [facility]  11/24/2022  9:30 AM Thayer Headings, PMHNP [facility] None    No orders of the defined types were placed in this encounter.   -------------------------------

## 2022-10-09 LAB — STOOL CULTURE: E coli, Shiga toxin Assay: NEGATIVE

## 2022-10-10 ENCOUNTER — Other Ambulatory Visit: Payer: Self-pay | Admitting: Family Medicine

## 2022-10-10 ENCOUNTER — Encounter: Payer: Self-pay | Admitting: Family Medicine

## 2022-10-10 DIAGNOSIS — R197 Diarrhea, unspecified: Secondary | ICD-10-CM

## 2022-10-10 DIAGNOSIS — J4520 Mild intermittent asthma, uncomplicated: Secondary | ICD-10-CM

## 2022-10-11 ENCOUNTER — Encounter: Payer: Self-pay | Admitting: Internal Medicine

## 2022-10-12 LAB — OVA AND PARASITE EXAMINATION
CONCENTRATE RESULT:: NONE SEEN
MICRO NUMBER:: 14093150
SPECIMEN QUALITY:: ADEQUATE
TRICHROME RESULT:: NONE SEEN

## 2022-10-12 LAB — C. DIFFICILE GDH AND TOXIN A/B
GDH ANTIGEN: NOT DETECTED
MICRO NUMBER:: 14093411
SPECIMEN QUALITY:: ADEQUATE
TOXIN A AND B: NOT DETECTED

## 2022-10-21 ENCOUNTER — Telehealth: Payer: Self-pay | Admitting: Psychiatry

## 2022-10-21 DIAGNOSIS — F5105 Insomnia due to other mental disorder: Secondary | ICD-10-CM

## 2022-10-21 DIAGNOSIS — F411 Generalized anxiety disorder: Secondary | ICD-10-CM

## 2022-10-21 DIAGNOSIS — F39 Unspecified mood [affective] disorder: Secondary | ICD-10-CM

## 2022-10-21 MED ORDER — DESVENLAFAXINE SUCCINATE ER 25 MG PO TB24: ORAL_TABLET | ORAL | 1 refills | Status: DC

## 2022-10-21 MED ORDER — AMITRIPTYLINE HCL 25 MG PO TABS: ORAL_TABLET | ORAL | 0 refills | Status: DC

## 2022-10-21 NOTE — Telephone Encounter (Signed)
Please advise her to decrease Pristiq to 1/2 tablet daily for one week, then stop. She can take Amitriptyline 25 mg 1/2 tablet at bedtime for one week (while also taking the 1/2 tab of Pristiq), and then can increase to the whole tab of Amitriptyline at bedtime. A script for Amitriptyline has been sent to her pharmacy and med list in [place] also reflects patient instructions.

## 2022-10-21 NOTE — Telephone Encounter (Signed)
Danielle [name] stating she is not doing well on the Pristiq. She has been on this medication for at least 2 weeks. The medication is giving her severe panic attacks, and her headaches came back. She is requesting to go back on the Amitriptyline, but how to get off of the Prestiq. Please advise.  A follow up is scheduled for 12/14 Contact information # [phone]

## 2022-10-21 NOTE — Telephone Encounter (Signed)
Pt informed

## 2022-10-21 NOTE — Telephone Encounter (Signed)
Please review

## 2022-11-08 NOTE — Progress Notes (Unsigned)
HPI: [name] is a 41 y.o. female, who is here today for her routine physical.  Last CPE: 11/08/2021  Regular exercise 3 or more time per week: *** Following a healthy diet: ***  Chronic medical problems: ***  Immunization History  Administered Date(s) Administered   Influenza Inj Mdck Quad Pf 08/23/2019, 08/14/2022   Influenza, Seasonal, Injecte, Preservative Fre 08/20/2015   Influenza,inj,Quad PF,6+ Mos 07/29/2018, 08/11/2020, 08/13/2021   Influenza,inj,quad, With Preservative 08/30/2016   Influenza-Unspecified 08/31/2017, 07/31/2020, 08/13/2021   PPD Test 11/25/2016, 11/29/2017, 11/14/2018, 10/29/2019, 10/28/2020, 11/08/2021   Tdap 11/25/2016   Unspecified SARS-COV-2 Vaccination 09/03/2020, 08/18/2021   Health Maintenance  Topic Date Due   COVID-19 Vaccine (3 - Mixed Product risk series) 09/15/2021   PAP SMEAR-Modifier  08/12/2024   INFLUENZA VACCINE  Completed   Hepatitis C Screening  Completed   HPV VACCINES  Aged Out   HIV Screening  Discontinued    She has *** concerns today.  Review of Systems  Current Outpatient Medications on File Prior to Visit  Medication Sig Dispense Refill   albuterol (VENTOLIN HFA) 108 (90 Base) MCG/ACT inhaler INHALE 2 PUFFS INTO THE LUNGS EVERY 6 (SIX) HOURS AS NEEDED. FOR WHEEZE OR SHORTNESS OF BREATH 8.5 each 2   amitriptyline (ELAVIL) 25 MG tablet TAKE 1/2 TABLET BY MOUTH EVERYDAY AT BEDTIME FOR ONE WEEK, THEN INCREASE TO 1 TABLET AT BEDTIME 90 tablet 0   cetirizine (ZYRTEC) 10 MG tablet Take 10 mg by mouth daily as needed.      clobetasol ointment (TEMOVATE) 1.85 % Apply 1 application. topically daily as needed. 30 g 1   clonazePAM (KLONOPIN) 0.5 MG tablet Take 1 tablet (0.5 mg total) by mouth 2 (two) times daily as needed for anxiety. 60 tablet 3   desvenlafaxine (PRISTIQ) 25 MG 24 hr tablet Take 1/2 tablet daily for one week, then stop 30 tablet 1   esomeprazole (NEXIUM) 40 MG capsule Take 1 capsule (40 mg total) by  mouth daily. 90 capsule 3   No current facility-administered medications on file prior to visit.    Past Medical History:  Diagnosis Date   Anxiety    Asthma    Constipation    Generalized headaches    Other and unspecified ovarian cysts    PONV (postoperative nausea and vomiting)    Pulse irregularity    Rapid heart rate    Seasonal allergies     Past Surgical History:  Procedure Laterality Date   BREAST LUMPECTOMY WITH RADIOACTIVE SEED LOCALIZATION Right 11/18/2021   Procedure: RIGHT BREAST LUMPECTOMY WITH RADIOACTIVE SEED LOCALIZATION;  Surgeon: Donnie Mesa, MD;  Location: [facility];  Service: General;  Laterality: Right;   [place] OF UTERUS  2005    Allergies  Allergen Reactions   Doxycycline Nausea And Vomiting    Other reaction(s): Vomiting    Family History  Problem Relation Age of Onset   Hypertension Mother    Hypertension Father    Anxiety disorder Father    Anxiety disorder Brother    Depression Brother    ADD / ADHD Son    Tourette syndrome Son    Depression Daughter    CAD Neg Hx     Social History   Socioeconomic History   Marital status: Married    Spouse name: Not on file   Number of children: 2   Years of education: Not on file   Highest education level: Associate degree: occupational, Hotel manager, or vocational program  Occupational History   Occupation: Therapist, 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  Tobacco Use   Smoking status: Never   Smokeless tobacco: Never  Vaping Use   Vaping Use: Never used  Substance and Sexual Activity   Alcohol use: Yes    Alcohol/week: 1.0 standard drink of alcohol    Types: 1 Standard drinks or equivalent per week    Comment: occasionally   Drug use: No   Sexual activity: Not on file  Other Topics Concern   Not on file  Social History Narrative   Not on file   Social Determinants of Health   Financial Resource Strain: Low Risk  (07/01/2022)   Overall Financial Resource Strain (CARDIA)     Difficulty of Paying Living Expenses: Not hard at all  Food Insecurity: No Food Insecurity (07/01/2022)   Hunger Vital Sign    Worried About Running Out of Food in the Last Year: Never true    Ran Out of Food in the Last Year: Never true  Transportation Needs: No Transportation Needs (07/01/2022)   PRAPARE - Hydrologist (Medical): No    Lack of Transportation (Non-Medical): No  Physical Activity: Insufficiently Active (07/01/2022)   Exercise Vital Sign    Days of Exercise per Week: 2 days    Minutes of Exercise per Session: 20 min  Stress: Stress Concern Present (07/01/2022)   Ali Chukson    Feeling of Stress : Rather much  Social Connections: Moderately Isolated (07/01/2022)   Social Connection and Isolation Panel [NHANES]    Frequency of Communication with Friends and Family: More than three times a week    Frequency of Social Gatherings with Friends and Family: Twice a week    Attends Religious Services: Never    Marine scientist or Organizations: No    Attends Music therapist: Not on file    Marital Status: Married    There were no vitals filed for this visit. There is no height or weight on file to calculate BMI.  Wt Readings from Last 3 Encounters:  07/04/22 181 lb 8 oz (82.3 kg)  11/18/21 176 lb 5.9 oz (80 kg)  11/08/21 179 lb 12.8 oz (81.6 kg)    Physical Exam Vitals and nursing note reviewed.  Constitutional:      General: She is not in acute distress.    Appearance: She is well-developed.  HENT:     Head: Normocephalic and atraumatic.     Right Ear: Hearing, tympanic membrane, ear canal and external ear normal.     Left Ear: Hearing, tympanic membrane, ear canal and external ear normal.     Mouth/Throat:     Mouth: Mucous membranes are moist.     Pharynx: Oropharynx is clear. Uvula midline.  Eyes:     Extraocular Movements: Extraocular movements  intact.     Conjunctiva/sclera: Conjunctivae normal.     Pupils: Pupils are equal, round, and reactive to light.  Neck:     Thyroid: No thyromegaly.     Trachea: No tracheal deviation.  Cardiovascular:     Rate and Rhythm: Normal rate and regular rhythm.     Pulses:          Dorsalis pedis pulses are 2+ on the right side and 2+ on the left side.       Posterior tibial pulses are 2+ on the right side and 2+ on the left side.     Heart  sounds: No murmur heard. Pulmonary:     Effort: Pulmonary effort is normal. No respiratory distress.     Breath sounds: Normal breath sounds.  Abdominal:     Palpations: Abdomen is soft. There is no hepatomegaly or mass.     Tenderness: There is no abdominal tenderness.  Genitourinary:    Comments: Deferred to gyn. Musculoskeletal:     Comments: No major deformity or signs of synovitis appreciated.  Lymphadenopathy:     Cervical: No cervical adenopathy.     Upper Body:     Right upper body: No supraclavicular adenopathy.     Left upper body: No supraclavicular adenopathy.  Skin:    General: Skin is warm.     Findings: No erythema or rash.  Neurological:     General: No focal deficit present.     Mental Status: She is alert and oriented to person, place, and time.     Cranial Nerves: No cranial nerve deficit.     Coordination: Coordination normal.     Gait: Gait normal.     Deep Tendon Reflexes:     Reflex Scores:      Bicep reflexes are 2+ on the right side and 2+ on the left side.      Patellar reflexes are 2+ on the right side and 2+ on the left side. Psychiatric:     Comments: Well groomed, good eye contact.     ASSESSMENT AND PLAN: Ms. Manisha Cancel was here today annual physical examination.  No orders of the defined types were placed in this encounter.   There are no diagnoses linked to this encounter.  There are no diagnoses linked to this encounter.  No follow-ups on file.  Kooper Godshall G. Martinique, MD  [facility]. [place] office.

## 2022-11-09 ENCOUNTER — Encounter: Payer: Self-pay | Admitting: Family Medicine

## 2022-11-09 ENCOUNTER — Ambulatory Visit (INDEPENDENT_AMBULATORY_CARE_PROVIDER_SITE_OTHER): Payer: BC Managed Care – PPO | Admitting: Family Medicine

## 2022-11-09 VITALS — BP 120/70 | HR 99 | Temp 98.2°F | Resp 12 | Ht 61.0 in | Wt 183.2 lb

## 2022-11-09 DIAGNOSIS — Z1329 Encounter for screening for other suspected endocrine disorder: Secondary | ICD-10-CM | POA: Diagnosis not present

## 2022-11-09 DIAGNOSIS — Z13228 Encounter for screening for other metabolic disorders: Secondary | ICD-10-CM

## 2022-11-09 DIAGNOSIS — J4520 Mild intermittent asthma, uncomplicated: Secondary | ICD-10-CM

## 2022-11-09 DIAGNOSIS — Z13220 Encounter for screening for lipoid disorders: Secondary | ICD-10-CM

## 2022-11-09 DIAGNOSIS — L301 Dyshidrosis [pompholyx]: Secondary | ICD-10-CM

## 2022-11-09 DIAGNOSIS — E785 Hyperlipidemia, unspecified: Secondary | ICD-10-CM

## 2022-11-09 DIAGNOSIS — Z130 Encounter for screening for diseases of the blood and blood-forming organs and certain disorders involving the immune mechanism: Secondary | ICD-10-CM

## 2022-11-09 DIAGNOSIS — Z111 Encounter for screening for respiratory tuberculosis: Secondary | ICD-10-CM | POA: Diagnosis not present

## 2022-11-09 DIAGNOSIS — Z0000 Encounter for general adult medical examination without abnormal findings: Secondary | ICD-10-CM

## 2022-11-09 DIAGNOSIS — K219 Gastro-esophageal reflux disease without esophagitis: Secondary | ICD-10-CM

## 2022-11-09 LAB — HEMOGLOBIN A1C: Hgb A1c MFr Bld: 4.8 % (ref 4.6–6.5)

## 2022-11-09 LAB — LIPID PANEL
Cholesterol: 192 mg/dL (ref 0–200)
HDL: 40.9 mg/dL (ref >39.00)
LDL Cholesterol: 122 mg/dL — ABNORMAL HIGH (ref 0–99)
NonHDL: 150.7
Total CHOL/HDL Ratio: 5
Triglycerides: 143 mg/dL (ref 0.0–149.0)
VLDL: 28.6 mg/dL (ref 0.0–40.0)

## 2022-11-09 LAB — BASIC METABOLIC PANEL
BUN: 11 mg/dL (ref 6–23)
CO2: 26 mEq/L (ref 19–32)
Calcium: 9.2 mg/dL (ref 8.4–10.5)
Chloride: 104 mEq/L (ref 96–112)
Creatinine, Ser: 0.66 mg/dL (ref 0.40–1.20)
GFR: 107.75 mL/min (ref >60.00)
Glucose, Bld: 93 mg/dL (ref 70–99)
Potassium: 3.8 mEq/L (ref 3.5–5.1)
Sodium: 139 mEq/L (ref 135–145)

## 2022-11-09 MED ORDER — CLOBETASOL PROPIONATE 0.05 % EX OINT: 1.0000 | TOPICAL_OINTMENT | Freq: Every day | CUTANEOUS | 1 refills | Status: DC | PRN

## 2022-11-09 MED ORDER — ESOMEPRAZOLE MAGNESIUM 40 MG PO CPDR: 40.0000 mg | DELAYED_RELEASE_CAPSULE | Freq: Every day | ORAL | 3 refills | Status: DC

## 2022-11-09 NOTE — Assessment & Plan Note (Signed)
Stable. Continue Albuterol inh qid prn. F/U in a year, before if symptoms become more frequent.

## 2022-11-09 NOTE — Patient Instructions (Addendum)
A few things to remember from today's visit:  Routine general medical examination at a health care facility  Screening for lipoid disorders  Screening for endocrine, metabolic and immunity disorder - Plan: Basic metabolic panel, Hemoglobin A1c  Hyperlipidemia, unspecified hyperlipidemia type - Plan: Lipid panel  Screening-pulmonary TB - Plan: QuantiFERON-TB Gold Plus  If you need refills for medications you take chronically, please call your pharmacy. Do not use My Chart to request refills or for acute issues that need immediate attention. If you send a my chart message, it may take a few days to be addressed, specially if I am not in the office.  Please be sure medication list is accurate. If a new problem present, please set up appointment sooner than planned today.  Health Maintenance, Female Adopting a healthy lifestyle and getting preventive care are important in promoting health and wellness. Ask your health care provider about: The right schedule for you to have regular tests and exams. Things you can do on your own to prevent diseases and keep yourself healthy. What should I know about diet, weight, and exercise? Eat a healthy diet  Eat a diet that includes plenty of vegetables, fruits, low-fat dairy products, and lean protein. Do not eat a lot of foods that are high in solid fats, added sugars, or sodium. Maintain a healthy weight Body mass index (BMI) is used to identify weight problems. It estimates body fat based on height and weight. Your health care provider can help determine your BMI and help you achieve or maintain a healthy weight. Get regular exercise Get regular exercise. This is one of the most important things you can do for your health. Most adults should: Exercise for at least 150 minutes each week. The exercise should increase your heart rate and make you sweat (moderate-intensity exercise). Do strengthening exercises at least twice a week. This is in  addition to the moderate-intensity exercise. Spend less time sitting. Even light physical activity can be beneficial. Watch cholesterol and blood lipids Have your blood tested for lipids and cholesterol at 43 years of age, then have this test every 5 years. Have your cholesterol levels checked more often if: Your lipid or cholesterol levels are high. You are older than 43 years of age. You are at high risk for heart disease. What should I know about cancer screening? Depending on your health history and family history, you may need to have cancer screening at various ages. This may include screening for: Breast cancer. Cervical cancer. Colorectal cancer. Skin cancer. Lung cancer. What should I know about heart disease, diabetes, and high blood pressure? Blood pressure and heart disease High blood pressure causes heart disease and increases the risk of stroke. This is more likely to develop in people who have high blood pressure readings or are overweight. Have your blood pressure checked: Every 3-5 years if you are 34-36 years of age. Every year if you are 29 years old or older. Diabetes Have regular diabetes screenings. This checks your fasting blood sugar level. Have the screening done: Once every three years after age 40 if you are at a normal weight and have a low risk for diabetes. More often and at a younger age if you are overweight or have a high risk for diabetes. What should I know about preventing infection? Hepatitis B If you have a higher risk for hepatitis B, you should be screened for this virus. Talk with your health care provider to find out if you are at risk  for hepatitis B infection. Hepatitis C Testing is recommended for: Everyone born from [birth year] through 1967. Anyone with known risk factors for hepatitis C. Sexually transmitted infections (STIs) Get screened for STIs, including gonorrhea and chlamydia, if: You are sexually active and are younger than 43 years of  age. You are older than 43 years of age and your health care provider tells you that you are at risk for this type of infection. Your sexual activity has changed since you were last screened, and you are at increased risk for chlamydia or gonorrhea. Ask your health care provider if you are at risk. Ask your health care provider about whether you are at high risk for HIV. Your health care provider may recommend a prescription medicine to help prevent HIV infection. If you choose to take medicine to prevent HIV, you should first get tested for HIV. You should then be tested every 3 months for as long as you are taking the medicine. Pregnancy If you are about to stop having your period (premenopausal) and you may become pregnant, seek counseling before you get pregnant. Take 400 to 800 micrograms (mcg) of folic acid every day if you become pregnant. Ask for birth control (contraception) if you want to prevent pregnancy. Osteoporosis and menopause Osteoporosis is a disease in which the bones lose minerals and strength with aging. This can result in bone fractures. If you are 42 years old or older, or if you are at risk for osteoporosis and fractures, ask your health care provider if you should: Be screened for bone loss. Take a calcium or vitamin D supplement to lower your risk of fractures. Be given hormone replacement therapy (HRT) to treat symptoms of menopause. Follow these instructions at home: Alcohol use Do not drink alcohol if: Your health care provider tells you not to drink. You are pregnant, may be pregnant, or are planning to become pregnant. If you drink alcohol: Limit how much you have to: 0-1 drink a day. Know how much alcohol is in your drink. In the [place] one drink equals one 12 oz bottle of beer (355 mL), one 5 oz glass of wine (148 mL), or one 1 oz glass of hard liquor (44 mL). Lifestyle Do not use any products that contain nicotine or tobacco. These products include  cigarettes, chewing tobacco, and vaping devices, such as e-cigarettes. If you need help quitting, ask your health care provider. Do not use street drugs. Do not share needles. Ask your health care provider for help if you need support or information about quitting drugs. General instructions Schedule regular health, dental, and eye exams. Stay current with your vaccines. Tell your health care provider if: You often feel depressed. You have ever been abused or do not feel safe at home. Summary Adopting a healthy lifestyle and getting preventive care are important in promoting health and wellness. Follow your health care provider's instructions about healthy diet, exercising, and getting tested or screened for diseases. Follow your health care provider's instructions on monitoring your cholesterol and blood pressure. This information is not intended to replace advice given to you by your health care provider. Make sure you discuss any questions you have with your health care provider. Document Revised: 04/19/2021 Document Reviewed: 04/19/2021 Elsevier Patient Education  [place].

## 2022-11-09 NOTE — Assessment & Plan Note (Signed)
Reported problem as well controlled. Continue Nexium 40 mg daily and GERD precautions.

## 2022-11-09 NOTE — Assessment & Plan Note (Signed)
Non pharmacologic treatment recommended for now. Further recommendations will be given according to 10 years CVD risk score and lipid panel numbers. 

## 2022-11-09 NOTE — Assessment & Plan Note (Addendum)
We discussed the importance of regular physical activity and healthy diet for prevention of chronic illness and/or complications. Preventive guidelines reviewed. Continue following with her gyn for female preventive care. Vaccination up to date. Next CPE in a year.

## 2022-11-09 NOTE — Assessment & Plan Note (Signed)
Problem is stable. Continue clobetasol cream daily as needed.

## 2022-11-12 LAB — QUANTIFERON-TB GOLD PLUS
Mitogen-NIL: >10 IU/mL
NIL: 0.03 IU/mL
QuantiFERON-TB Gold Plus: NEGATIVE
TB1-NIL: 0.01 IU/mL
TB2-NIL: 0.02 IU/mL

## 2022-11-15 ENCOUNTER — Emergency Department (HOSPITAL_BASED_OUTPATIENT_CLINIC_OR_DEPARTMENT_OTHER): Payer: BC Managed Care – PPO

## 2022-11-15 ENCOUNTER — Emergency Department (HOSPITAL_BASED_OUTPATIENT_CLINIC_OR_DEPARTMENT_OTHER)
Admission: EM | Admit: 2022-11-15 | Discharge: 2022-11-15 | Disposition: A | Discharge status: Home / Self Care | Payer: BC Managed Care – PPO | Attending: Emergency Medicine | Admitting: Emergency Medicine

## 2022-11-15 ENCOUNTER — Encounter (HOSPITAL_BASED_OUTPATIENT_CLINIC_OR_DEPARTMENT_OTHER): Payer: Self-pay | Admitting: Emergency Medicine

## 2022-11-15 ENCOUNTER — Other Ambulatory Visit: Payer: Self-pay

## 2022-11-15 DIAGNOSIS — R1030 Lower abdominal pain, unspecified: Secondary | ICD-10-CM | POA: Diagnosis not present

## 2022-11-15 DIAGNOSIS — N83202 Unspecified ovarian cyst, left side: Secondary | ICD-10-CM | POA: Insufficient documentation

## 2022-11-15 DIAGNOSIS — R102 Pelvic and perineal pain: Secondary | ICD-10-CM | POA: Diagnosis not present

## 2022-11-15 LAB — CBC
HCT: 38.6 % (ref 36.0–46.0)
Hemoglobin: 13.9 g/dL (ref 12.0–15.0)
MCH: 30.8 pg (ref 26.0–34.0)
MCHC: 36 g/dL (ref 30.0–36.0)
MCV: 85.4 fL (ref 80.0–100.0)
Platelets: 316 10*3/uL (ref 150–400)
RBC: 4.52 MIL/uL (ref 3.87–5.11)
RDW: 12 % (ref 11.5–15.5)
WBC: 12.5 10*3/uL — ABNORMAL HIGH (ref 4.0–10.5)
nRBC: 0 % (ref 0.0–0.2)

## 2022-11-15 LAB — COMPREHENSIVE METABOLIC PANEL
ALT: 12 U/L (ref 0–44)
AST: 16 U/L (ref 15–41)
Albumin: 4.4 g/dL (ref 3.5–5.0)
Alkaline Phosphatase: 75 U/L (ref 38–126)
Anion gap: 11 (ref 5–15)
BUN: 14 mg/dL (ref 6–20)
CO2: 22 mmol/L (ref 22–32)
Calcium: 9.4 mg/dL (ref 8.9–10.3)
Chloride: 106 mmol/L (ref 98–111)
Creatinine, Ser: 0.73 mg/dL (ref 0.44–1.00)
GFR, Estimated: >60 mL/min (ref >60)
Glucose, Bld: 114 mg/dL — ABNORMAL HIGH (ref 70–99)
Potassium: 3.9 mmol/L (ref 3.5–5.1)
Sodium: 139 mmol/L (ref 135–145)
Total Bilirubin: 0.5 mg/dL (ref 0.3–1.2)
Total Protein: 7.9 g/dL (ref 6.5–8.1)

## 2022-11-15 LAB — URINALYSIS, ROUTINE W REFLEX MICROSCOPIC
Bilirubin Urine: NEGATIVE
Glucose, UA: NEGATIVE mg/dL
Hgb urine dipstick: NEGATIVE
Ketones, ur: NEGATIVE mg/dL
Nitrite: NEGATIVE
Protein, ur: NEGATIVE mg/dL
Specific Gravity, Urine: 1.022 (ref 1.005–1.030)
pH: 6 (ref 5.0–8.0)

## 2022-11-15 LAB — LIPASE, BLOOD: Lipase: 19 U/L (ref 11–51)

## 2022-11-15 LAB — PREGNANCY, URINE: Preg Test, Ur: NEGATIVE

## 2022-11-15 MED ORDER — ONDANSETRON 4 MG PO TBDP
4.0000 mg | ORAL_TABLET | Freq: Once | ORAL | Status: AC
  Administered 2022-11-15: 4 mg via ORAL
  Filled 2022-11-15: qty 1

## 2022-11-15 MED ORDER — IBUPROFEN 400 MG PO TABS
600.0000 mg | ORAL_TABLET | Freq: Once | ORAL | Status: AC
  Administered 2022-11-15: 600 mg via ORAL
  Filled 2022-11-15: qty 1

## 2022-11-15 NOTE — ED Provider Notes (Signed)
[facility]  Provider Note  CSN: [id number] Arrival date & time: 11/15/22 0006  History Chief Complaint  Patient presents with   Abdominal Pain    Danielle [name] is a 41 y.o. female with remote history of ruptured ovarian cyst reports onset of lower abdominal discomfort about 24 hours ago that got progressively worse as the day went on, similar to prior cysts. She had some nausea, no vomiting. No fever. Occasional diarrhea, no blood. No dysuria or hematuria. No vaginal bleeding or discharge had normal menses last week. Since arriving to the ED her pain has improved.    Home Medications Prior to Admission medications   Medication Sig Start Date End Date Taking? Authorizing Provider  albuterol (VENTOLIN HFA) 108 (90 Base) MCG/ACT inhaler INHALE 2 PUFFS INTO THE LUNGS EVERY 6 (SIX) HOURS AS NEEDED. FOR WHEEZE OR SHORTNESS OF BREATH 10/10/22   [place], Betty G, MD  amitriptyline (ELAVIL) 25 MG tablet TAKE 1/2 TABLET BY MOUTH EVERYDAY AT BEDTIME FOR ONE WEEK, THEN INCREASE TO 1 TABLET AT BEDTIME 10/21/22   Thayer Headings, PMHNP  cetirizine (ZYRTEC) 10 MG tablet Take 10 mg by mouth daily as needed.     [provider]  clobetasol ointment (TEMOVATE) 2.35 % Apply 1 Application topically daily as needed. 11/09/22   Martin[place], Betty G, MD  clonazePAM (KLONOPIN) 0.5 MG tablet Take 1 tablet (0.5 mg total) by mouth 2 (two) times daily as needed for anxiety. 06/24/22 07/24/22  Thayer Headings, PMHNP  esomeprazole (NEXIUM) 40 MG capsule Take 1 capsule (40 mg total) by mouth daily. 11/09/22   [place], Betty G, MD     Allergies    Doxycycline   Review of Systems   Review of Systems Please see HPI for pertinent positives and negatives  Physical Exam BP 138/88   Pulse 82   Temp 98.5 F (36.9 C) (Oral)   Resp 18   LMP 11/10/2022   SpO2 96%   Physical Exam Vitals and nursing note reviewed.  Constitutional:      Appearance: Normal appearance.  HENT:      Head: Normocephalic and atraumatic.     Nose: Nose normal.     Mouth/Throat:     Mouth: Mucous membranes are moist.  Eyes:     Extraocular Movements: Extraocular movements intact.     Conjunctiva/sclera: Conjunctivae normal.  Cardiovascular:     Rate and Rhythm: Normal rate.  Pulmonary:     Effort: Pulmonary effort is normal.     Breath sounds: Normal breath sounds.  Abdominal:     General: Abdomen is flat.     Palpations: Abdomen is soft.     Tenderness: There is abdominal tenderness in the right lower quadrant and left lower quadrant. There is no guarding. Negative signs include Murphy's sign and McBurney's sign.  Musculoskeletal:        General: No swelling. Normal range of motion.     Cervical back: Neck supple.  Skin:    General: Skin is warm and dry.  Neurological:     General: No focal deficit present.     Mental Status: She is alert.  Psychiatric:        Mood and Affect: Mood normal.     ED Results / Procedures / Treatments   EKG None  Procedures Procedures  Medications Ordered in the ED Medications  ibuprofen (ADVIL) tablet 600 mg (600 mg Oral Given 11/15/22 0325)  ondansetron (ZOFRAN-ODT) disintegrating tablet 4 mg (4 mg Oral Given  11/15/22 0330)    Initial Impression and Plan  Patient here with lower abdominal pain, similar to prior ovarian cyst. She has had improvement in pain since arrival but not yet resolved. Labs done in triage show CBC with mild leukocytosis, normal CMP and Lipase, UA is clear. HCG is neg. Will give motrin for pain, declined norco. Send for [place].   ED Course   Clinical Course as of 11/15/22 0550  Tue Nov 15, 2022  0549 I personally viewed the images from radiology studies and agree with radiologist interpretation: US shows a L ovarian cyst without complications, rupture or torsion. Patient reports some improvement in pain. Offered additional pain meds but she declines. Recommend outpatient Gyn follow up. RTED for any worsening pain,  fever or other concerns.  [CS]    Clinical Course User Index [CS] Truddie Hidden, MD     MDM Rules/Calculators/A&P Medical Decision Making Problems Addressed: Left ovarian cyst: acute illness or injury  Amount and/or Complexity of Data Reviewed Labs: ordered. Decision-making details documented in ED Course. Radiology: ordered and independent interpretation performed. Decision-making details documented in ED Course.  Risk Prescription drug management.    Final Clinical Impression(s) / ED Diagnoses Final diagnoses:  Left ovarian cyst    Rx / DC Orders ED Discharge Orders     None        Truddie Hidden, MD 11/15/22 [phone]

## 2022-11-15 NOTE — ED Triage Notes (Signed)
Abdominal pain with n/v started this afternoon. Similar pain when she has ovarian cyst.

## 2022-11-16 DIAGNOSIS — N83202 Unspecified ovarian cyst, left side: Secondary | ICD-10-CM | POA: Diagnosis not present

## 2022-11-16 DIAGNOSIS — N83209 Unspecified ovarian cyst, unspecified side: Secondary | ICD-10-CM | POA: Diagnosis not present

## 2022-11-18 ENCOUNTER — Other Ambulatory Visit (INDEPENDENT_AMBULATORY_CARE_PROVIDER_SITE_OTHER): Payer: BC Managed Care – PPO

## 2022-11-18 ENCOUNTER — Ambulatory Visit: Payer: BC Managed Care – PPO | Admitting: Internal Medicine

## 2022-11-18 ENCOUNTER — Encounter: Payer: Self-pay | Admitting: Internal Medicine

## 2022-11-18 DIAGNOSIS — F39 Unspecified mood [affective] disorder: Secondary | ICD-10-CM

## 2022-11-18 DIAGNOSIS — F411 Generalized anxiety disorder: Secondary | ICD-10-CM

## 2022-11-18 DIAGNOSIS — R1033 Periumbilical pain: Secondary | ICD-10-CM

## 2022-11-18 DIAGNOSIS — F99 Mental disorder, not otherwise specified: Secondary | ICD-10-CM

## 2022-11-18 DIAGNOSIS — R197 Diarrhea, unspecified: Secondary | ICD-10-CM

## 2022-11-18 DIAGNOSIS — F5105 Insomnia due to other mental disorder: Secondary | ICD-10-CM

## 2022-11-18 LAB — H. PYLORI ANTIBODY, IGG: H Pylori IgG: NEGATIVE

## 2022-11-18 MED ORDER — AMITRIPTYLINE HCL 50 MG PO TABS: 50.0000 mg | ORAL_TABLET | Freq: Every day | ORAL | 3 refills | Status: DC

## 2022-11-18 NOTE — Progress Notes (Signed)
Chief Complaint: Diarrhea  HPI : 41 year old female with history of asthma, constipation, asthma, IBS, anxiety, GERD presents with diarrhea  She will eat a heavier meal and then develop stomach pain. The pain is located in the umbilical area. This pain would culminate in diarrhea and overall has gotten better over time. This week she had to go to the ab pain to the ED, which she was told was due to an ovarian cyst. Her bowel habits are very unpredictable. She was diagnosed with IBS when she was 27-5 years old. She has never had regular bowel habits. Her stool frequency typically varies between diarrhea and constipation. She will sometimes experience fecal urgency. She will be at an activity and then has to rush to the bathroom to have a BM. Before traveling, she has to use a suppository to force herself to have a BM so that she doesn't have to use the bathroom later on. She is on average having 1-4 BMs per day. A few days per week she will have diarrhea.  Her stools can be completely liquid. Endorses occasional nocturnal stools (once a month). Denies blood in the stools. She does tend to have more diarrhea after eating dairy. She has never been a heavy alcohol drinker. Denies prior cholecystectomy. She will use Imodium on occasion when traveling, which will tend to send her in the other direction to constipation. Father has history of diverticulosis. She is on Nexium and this keeps her reflux under good control. Denies vomiting, dysphagia, or weight loss. Denies prior EGD or colonoscopy. She does take amitriptyline 25 mg QD.  Wt Readings from Last 3 Encounters:  11/18/22 183 lb 8 oz (83.2 kg)  11/09/22 183 lb 4 oz (83.1 kg)  07/04/22 181 lb 8 oz (82.3 kg)   Past Medical History:  Diagnosis Date   Anxiety    Asthma    Constipation    Depression    Generalized headaches    GERD (gastroesophageal reflux disease)    IBS (irritable bowel syndrome)    Other and unspecified ovarian cysts    PONV  (postoperative nausea and vomiting)    Pulse irregularity    Rapid heart rate    Seasonal allergies    Past Surgical History:  Procedure Laterality Date   BREAST LUMPECTOMY WITH RADIOACTIVE SEED LOCALIZATION Right 11/18/2021   Procedure: RIGHT BREAST LUMPECTOMY WITH RADIOACTIVE SEED LOCALIZATION;  Surgeon: Donnie Mesa, MD;  Location: [facility];  Service: General;  Laterality: Right;   DILATION AND CURETTAGE OF UTERUS  2005   Family History  Problem Relation Age of Onset   Hypertension Mother    Hypertension Father    Anxiety disorder Father    Anxiety disorder Brother    Depression Brother    ADD / ADHD Son    Tourette syndrome Son    Depression Daughter    CAD Neg Hx    Social History   Tobacco Use   Smoking status: Never   Smokeless tobacco: Never  Vaping Use   Vaping Use: Never used  Substance Use Topics   Alcohol use: Yes    Alcohol/week: 1.0 standard drink of alcohol    Types: 1 Standard drinks or equivalent per week    Comment: occasionally   Drug use: No   Current Outpatient Medications  Medication Sig Dispense Refill   albuterol (VENTOLIN HFA) 108 (90 Base) MCG/ACT inhaler INHALE 2 PUFFS INTO THE LUNGS EVERY 6 (SIX) HOURS AS NEEDED. FOR WHEEZE OR SHORTNESS  OF BREATH 8.5 each 2   amitriptyline (ELAVIL) 25 MG tablet TAKE 1/2 TABLET BY MOUTH EVERYDAY AT BEDTIME FOR ONE WEEK, THEN INCREASE TO 1 TABLET AT BEDTIME 90 tablet 0   cetirizine (ZYRTEC) 10 MG tablet Take 10 mg by mouth daily as needed.      clobetasol ointment (TEMOVATE) 4.07 % Apply 1 Application topically daily as needed. 30 g 1   clonazePAM (KLONOPIN) 0.5 MG tablet Take 1 tablet (0.5 mg total) by mouth 2 (two) times daily as needed for anxiety. 60 tablet 3   esomeprazole (NEXIUM) 40 MG capsule Take 1 capsule (40 mg total) by mouth daily. 90 capsule 3   naproxen (NAPROSYN) 500 MG tablet Take 500 mg by mouth 2 (two) times daily with a meal.     ZOFRAN 4 MG tablet Take 4 mg by mouth every 8  (eight) hours as needed.     No current facility-administered medications for this visit.   Allergies  Allergen Reactions   Doxycycline Nausea And Vomiting    Other reaction(s): Vomiting     Review of Systems: All systems reviewed and negative except where noted in HPI.   Physical Exam: BP 110/86   Pulse 100   Ht '5\' 1"'$  (1.549 m)   Wt 183 lb 8 oz (83.2 kg)   LMP 11/10/2022   BMI 34.67 kg/m  Constitutional: Pleasant,well-developed, female in no acute distress. HEENT: Normocephalic and atraumatic. Conjunctivae are normal. No scleral icterus. Cardiovascular: Normal rate, regular rhythm.  Pulmonary/chest: Effort normal and breath sounds normal. No wheezing, rales or rhonchi. Abdominal: Soft, nondistended, nontender. Bowel sounds active throughout. There are no masses palpable. No hepatomegaly. Extremities: No edema Neurological: Alert and oriented to person place and time. Skin: Skin is warm and dry. No rashes noted. Psychiatric: Normal mood and affect. Behavior is normal.  Labs 06/2022: CBC nml. CMP nml. CRP nml. TSH nml.   Labs 09/2022: C dif negative. Stool culture negative. O&P negative  Labs 10/2022: BMP nml.  Labs 11/2022: CBC with elevated WBC of 12.5. CMP unremarkable. Lipase nml.   ASSESSMENT AND PLAN: Diarrhea Fecal urgency Periumbilical ab pain IBS Patient presents with diarrhea and fecal urgency that occurs with periumbilical ab pain. Imodium causes her to develop constipation. This is likely due to IBS but will also plan to do a basic lab work up and plan for RUQ U/S to ensure that gallstones, celiac disease, H pylori infection, alpha gal syndrome are not contributing. Will have her follow the low FODMAP diet and increase her amitriptyline to see if this helps with her symptoms. - Low FODMAP diet - Check TTG IgA, IgA, H pylori antibody, alpha gal panel - Could trial Pepto Bismol to help with diarrhea - RUQ U/S - Increase amitriptyline from 25 mg to 50 mg  [facility] - RTC 2 months  Christia Reading, MD  I spent 60 minutes of time, including in depth chart review, independent review of results as outlined above, communicating results with the patient directly, face-to-face time with the patient, coordinating care, ordering studies and medications as appropriate, and documentation.

## 2022-11-18 NOTE — Patient Instructions (Addendum)
_______________________________________________________  If you are age 43 or older, your body mass index should be between 23-30. Your Body mass index is 34.67 kg/m. If this is out of the aforementioned range listed, please consider follow up with your Primary Care Provider.  If you are age 64 or younger, your body mass index should be between 19-25. Your Body mass index is 34.67 kg/m. If this is out of the aformentioned range listed, please consider follow up with your Primary Care Provider.   ________________________________________________________  The [facility] providers would like to encourage you to use [facility] to communicate with providers for non-urgent requests or questions.  Due to long hold times on the telephone, sending your provider a message by [facility] may be a faster and more efficient way to get a response.  Please allow 48 business hours for a response.  Please remember that this is for non-urgent requests.  _______________________________________________________  Your provider has requested that you go to the basement level for lab work before leaving today. Press "B" on the elevator. The lab is located at the first door on the left as you exit the elevator.  We have sent the following medications to your pharmacy for you to pick up at your convenience:  INCREASE: Amitriptyline to '50mg'$  every night at bedtime.  You will follow up in our office on 01-17-23 at 8:50am.  Today an ultrasound was ordered but not scheduled as you requested to consult with OB-Gyn first.  Thank you for entrusting me with your care and choosing [facility].  Dr Lorenso Courier

## 2022-11-22 ENCOUNTER — Encounter: Payer: Self-pay | Admitting: Internal Medicine

## 2022-11-22 LAB — TISSUE TRANSGLUTAMINASE, IGA: (tTG) Ab, IgA: <1 U/mL

## 2022-11-22 LAB — ALPHA GAL IGE: GALACTOSE-ALPHA-1,3-GALACTOSE IGE*: <0.1 kU/L (ref <0.10)

## 2022-11-22 LAB — IGA: Immunoglobulin A: 1314 mg/dL — ABNORMAL HIGH (ref 47–310)

## 2022-11-24 ENCOUNTER — Ambulatory Visit: Payer: BC Managed Care – PPO | Admitting: Psychiatry

## 2022-11-24 ENCOUNTER — Encounter: Payer: Self-pay | Admitting: Psychiatry

## 2022-11-24 DIAGNOSIS — F5105 Insomnia due to other mental disorder: Secondary | ICD-10-CM

## 2022-11-24 DIAGNOSIS — F411 Generalized anxiety disorder: Secondary | ICD-10-CM | POA: Diagnosis not present

## 2022-11-24 DIAGNOSIS — F99 Mental disorder, not otherwise specified: Secondary | ICD-10-CM

## 2022-11-24 DIAGNOSIS — F410 Panic disorder [episodic paroxysmal anxiety] without agoraphobia: Secondary | ICD-10-CM | POA: Diagnosis not present

## 2022-11-24 MED ORDER — TOPIRAMATE 25 MG PO TABS: ORAL_TABLET | ORAL | 3 refills | Status: DC

## 2022-11-24 MED ORDER — CLONAZEPAM 0.5 MG PO TABS: 0.5000 mg | ORAL_TABLET | Freq: Two times a day (BID) | ORAL | 5 refills | Status: DC | PRN

## 2022-11-24 NOTE — Progress Notes (Signed)
Danielle [name] [id number] 05-03-1981 41 y.o.  Subjective:   Patient ID:  Danielle [name] is a 41 y.o. (DOB 01-20-1981) female.  Chief Complaint:  Chief Complaint  Patient presents with   Anxiety   Follow-up    Mood disturbance, insomnia    HPI Danielle [name] presents to the office today for follow-up of mood disturbance, anxiety, and insomnia. She reports that she has not been feeling physically well for several weeks and is continuing to have panic attacks. Reports panic attacks occur about 3 times a week. She has panic attacks when she has a thought about if she is going to have a panic attack, ie. While driiving and in school pick up line to get her child. She reports that panic seem to be triggered when she feels trapped. She is taking back roads to avoid high speed roads that trigger anxiety. She has also been having diarrhea at inconvenient times.  GI specialist recently increased Amitriptyline to possible help with IBS and has been tolerating increase for about a week.   She reports that Klonopin does not relieve acute panic and it may help preventatively, but not consistently. She reports that she has significant anticipatory anxiety around panic, ie. When will it happen, how she will handle it, etc. She reports that she has some mild depression and is less excited about Christmas than usual. Energy and motivation have been lower some days. Sleeping ok. She reports feeling "leery of food" due to GI issues. Appetite has been slightly decreased. Concentration has been slightly decreased. She has had to ask people to repeat themselves at times. Denies SI.   Daughter has had some mental health issues and this has been stressful. Daughter's mental health has improved with medication changes.   Klonopin last filled 11/16/22.  Past Psychiatric Medication Trials: Abilify- Tremor Latuda Vraylar- Irritability, Agitation Rexulti- Palpitations Lamictal Viibryd- panic Trintellix-  HA's, possible headaches. Prozac Effexor XR- Caused manic s/s.  Zoloft Lexapro Wellbutrin- Headaches Buspar Xanax- excessive somnolence, ineffective Clorazepate- ineffective Klonopin Ativan Trileptal-ineffective Trazodone- Has had dizziness and nausea with doses above 100 mg.  Deplin Propranolol-Ineffective Clonidine- May have been helpful for panic s/s. Caused drowsiness.  Gabapentin- Noticed restlessness with 900 mg.  Amitriptyline- Helpful for headaches and mood.   AUDIT    Flowsheet Row Office Visit from 07/04/2022 in [facility] at [place]  Alcohol Use Disorder Identification Test Final Score (AUDIT) 3      PHQ2-9    [place] Office Visit from 11/09/2022 in [facility] at [place] from 07/04/2022 in [place] at [facility] from 11/08/2021 in [facility] at [place] from 01/10/2017 in [place] at [place] from 10/07/2016 in [facility] at [facility] Total Score 2 0 2 0 1  PHQ-9 Total Score '3 2 7 '$ -- --      Flowsheet Row ED from 11/15/2022 in [facility] Admission (Discharged) from 11/18/2021 in [place] No Risk No Risk        Review of Systems:  Review of Systems  Gastrointestinal:  Positive for constipation and diarrhea.  Genitourinary:        Pain associated with ovarian cyst  Musculoskeletal:  Positive for back pain. Negative for gait problem.  Psychiatric/Behavioral:         Please refer to HPI    Medications: I have reviewed the patient's current medications.  Current Outpatient Medications  Medication Sig Dispense Refill  amitriptyline (ELAVIL) 50 MG tablet Take 1 tablet (50 mg total) by mouth at bedtime. TAKE 1/2 TABLET BY MOUTH EVERYDAY AT BEDTIME FOR ONE WEEK, THEN INCREASE TO 1 TABLET AT BEDTIME 30 tablet 3   cetirizine (ZYRTEC) 10 MG tablet Take 10 mg by mouth daily as needed.      clobetasol ointment  (TEMOVATE) 4.69 % Apply 1 Application topically daily as needed. 30 g 1   esomeprazole (NEXIUM) 40 MG capsule Take 1 capsule (40 mg total) by mouth daily. 90 capsule 3   topiramate (TOPAMAX) 25 MG tablet Take 1/2 tab at bedtime for one week, then increase to 1 tab at bedtime 30 tablet 3   albuterol (VENTOLIN HFA) 108 (90 Base) MCG/ACT inhaler INHALE 2 PUFFS INTO THE LUNGS EVERY 6 (SIX) HOURS AS NEEDED. FOR WHEEZE OR SHORTNESS OF BREATH 8.5 each 2   [START ON 12/14/2022] clonazePAM (KLONOPIN) 0.5 MG tablet Take 1 tablet (0.5 mg total) by mouth 2 (two) times daily as needed for anxiety. 60 tablet 5   naproxen (NAPROSYN) 500 MG tablet Take 500 mg by mouth 2 (two) times daily with a meal. (Patient not taking: Reported on 11/24/2022)     ZOFRAN 4 MG tablet Take 4 mg by mouth every 8 (eight) hours as needed.     No current facility-administered medications for this visit.    Medication Side Effects: None  Allergies:  Allergies  Allergen Reactions   Doxycycline Nausea And Vomiting    Other reaction(s): Vomiting    Past Medical History:  Diagnosis Date   Anxiety    Asthma    Constipation    Depression    Generalized headaches    GERD (gastroesophageal reflux disease)    IBS (irritable bowel syndrome)    Other and unspecified ovarian cysts    PONV (postoperative nausea and vomiting)    Pulse irregularity    Rapid heart rate    Seasonal allergies     Past Medical History, Surgical history, Social history, and Family history were reviewed and updated as appropriate.   Please see review of systems for further details on the patient's review from today.   Objective:   Physical Exam:  LMP 11/10/2022   Physical Exam Constitutional:      General: She is not in acute distress. Musculoskeletal:        General: No deformity.  Neurological:     Mental Status: She is alert and oriented to person, place, and time.     Coordination: Coordination normal.  Psychiatric:        Attention and  Perception: Attention and perception normal. She does not perceive auditory or visual hallucinations.        Mood and Affect: Mood is anxious. Mood is not depressed. Affect is not labile, blunt, angry or inappropriate.        Speech: Speech normal.        Behavior: Behavior normal.        Thought Content: Thought content normal. Thought content is not paranoid or delusional. Thought content does not include homicidal or suicidal ideation. Thought content does not include homicidal or suicidal plan.        Cognition and Memory: Cognition and memory normal.        Judgment: Judgment normal.     Comments: Insight intact     Lab Review:     Component Value Date/Time   NA 139 11/15/2022 0021   K 3.9 11/15/2022 0021   CL 106 11/15/2022 0021  CO2 22 11/15/2022 0021   GLUCOSE 114 (H) 11/15/2022 0021   BUN 14 11/15/2022 0021   CREATININE 0.73 11/15/2022 0021   CREATININE 0.69 10/28/2020 1118   CALCIUM 9.4 11/15/2022 0021   PROT 7.9 11/15/2022 0021   ALBUMIN 4.4 11/15/2022 0021   AST 16 11/15/2022 0021   ALT 12 11/15/2022 0021   ALKPHOS 75 11/15/2022 0021   BILITOT 0.5 11/15/2022 0021   GFRNONAA >60 11/15/2022 0021   GFRNONAA 109 10/28/2020 1118   GFRAA 126 10/28/2020 1118       Component Value Date/Time   WBC 12.5 (H) 11/15/2022 0021   RBC 4.52 11/15/2022 0021   HGB 13.9 11/15/2022 0021   HCT 38.6 11/15/2022 0021   PLT 316 11/15/2022 0021   MCV 85.4 11/15/2022 0021   MCH 30.8 11/15/2022 0021   MCHC 36.0 11/15/2022 0021   RDW 12.0 11/15/2022 0021    No results found for: "POCLITH", "LITHIUM"   No results found for: "PHENYTOIN", "PHENOBARB", "VALPROATE", "CBMZ"   .res Assessment: Plan:    Pt seen for 30 minutes and time spent discussing treatment plan. She reports that GI specialist has increased Amitriptyline to 50 mg po QHS to possibly improve IBS. She would like to continue Amitriptyline 50 mg at bedtime to also determine if it may be helpful for her anxiety,  depression, and insomnia.  Discussed potential benefits, risks, and side effects of Topamax. Discussed that Topamax is used off-label for anxiety. Pt agree to trial of low dose Topamax.  Continue Klonopin 0.5 mg po BID prn anxiety since she reports that this is more effective than other benzodiazepines she has taken in the past.  Pt to follow-up in 3 months or sooner if clinically indicated.  Patient advised to contact office with any questions, adverse effects, or acute worsening in signs and symptoms.   Fancy was seen today for anxiety and follow-up.  Diagnoses and all orders for this visit:  Generalized anxiety disorder -     topiramate (TOPAMAX) 25 MG tablet; Take 1/2 tab at bedtime for one week, then increase to 1 tab at bedtime -     clonazePAM (KLONOPIN) 0.5 MG tablet; Take 1 tablet (0.5 mg total) by mouth 2 (two) times daily as needed for anxiety.  Panic disorder -     topiramate (TOPAMAX) 25 MG tablet; Take 1/2 tab at bedtime for one week, then increase to 1 tab at bedtime -     clonazePAM (KLONOPIN) 0.5 MG tablet; Take 1 tablet (0.5 mg total) by mouth 2 (two) times daily as needed for anxiety.  Insomnia due to other mental disorder -     topiramate (TOPAMAX) 25 MG tablet; Take 1/2 tab at bedtime for one week, then increase to 1 tab at bedtime -     clonazePAM (KLONOPIN) 0.5 MG tablet; Take 1 tablet (0.5 mg total) by mouth 2 (two) times daily as needed for anxiety.     Please see After Visit Summary for patient specific instructions.  Future Appointments  Date Time Provider [place]  01/12/2023 12:30 PM Dell Ponto, DO [facility] None  01/17/2023  8:50 AM Sharyn Creamer, MD [facility]  02/24/2023  8:30 AM Thayer Headings, PMHNP CP-CP None  11/17/2023  8:30 AM Martinique, Betty G, MD [facility]    No orders of the defined types were placed in this encounter.   -------------------------------

## 2022-12-16 ENCOUNTER — Other Ambulatory Visit: Payer: Self-pay | Admitting: Internal Medicine

## 2022-12-16 DIAGNOSIS — F5105 Insomnia due to other mental disorder: Secondary | ICD-10-CM

## 2022-12-16 DIAGNOSIS — F411 Generalized anxiety disorder: Secondary | ICD-10-CM

## 2022-12-16 DIAGNOSIS — F39 Unspecified mood [affective] disorder: Secondary | ICD-10-CM

## 2023-01-06 DIAGNOSIS — N83202 Unspecified ovarian cyst, left side: Secondary | ICD-10-CM | POA: Diagnosis not present

## 2023-01-12 ENCOUNTER — Encounter (INDEPENDENT_AMBULATORY_CARE_PROVIDER_SITE_OTHER): Payer: Self-pay | Admitting: Family Medicine

## 2023-01-17 ENCOUNTER — Ambulatory Visit: Payer: BC Managed Care – PPO | Admitting: Internal Medicine

## 2023-01-23 ENCOUNTER — Telehealth: Payer: Self-pay | Admitting: Family Medicine

## 2023-01-23 NOTE — Telephone Encounter (Signed)
Needs prescription for albuterol sulfate aerosol, albuterol tartrate. Insurance not longer covers albuterol (VENTOLIN HFA) 108 (90 Base) MCG/ACT inhaler

## 2023-02-24 ENCOUNTER — Encounter: Payer: Self-pay | Admitting: Psychiatry

## 2023-02-24 ENCOUNTER — Ambulatory Visit: Payer: BC Managed Care – PPO | Admitting: Psychiatry

## 2023-02-24 DIAGNOSIS — F5105 Insomnia due to other mental disorder: Secondary | ICD-10-CM | POA: Diagnosis not present

## 2023-02-24 DIAGNOSIS — F99 Mental disorder, not otherwise specified: Secondary | ICD-10-CM

## 2023-02-24 DIAGNOSIS — F39 Unspecified mood [affective] disorder: Secondary | ICD-10-CM

## 2023-02-24 DIAGNOSIS — F411 Generalized anxiety disorder: Secondary | ICD-10-CM | POA: Diagnosis not present

## 2023-02-24 DIAGNOSIS — F410 Panic disorder [episodic paroxysmal anxiety] without agoraphobia: Secondary | ICD-10-CM | POA: Diagnosis not present

## 2023-02-24 MED ORDER — AUVELITY 45-105 MG PO TBCR: EXTENDED_RELEASE_TABLET | ORAL | 0 refills | Status: DC

## 2023-02-24 NOTE — Progress Notes (Signed)
Danielle [name] [id number] October 17, 1981 42 y.o.  Subjective:   Patient ID:  Danielle [name] is a 42 y.o. (DOB January 25, 1981) female.  Chief Complaint:  Chief Complaint  Patient presents with   Follow-up    Anxiety, mood disturbance, insomnia    HPI Danielle [name] presents to the office today for follow-up of anxiety, mood disturbance, and sleep disturbance. She reports, "the panic attacks have slowed a little bit since I went back on Amitriptyline." Panic attacks are down to once a week. Continues to avoid certain highways since it has triggered panic in the past and will take a longer route. She reports that she continues to have high anxiety in general. Reports that she is "in a little bit of a funk." She reports that her motivation has been lower. "I function for the most part, but not a lot of interest in cooking and planning stuff." Energy is slightly low- "a drained feeling." Sleep has been ok. Reports that appetite is "the same." Denies any change in the way her clothes fit. Concentration has been ok. Denies anhedonia. Denies SI.   She reports that there were some significant changes at work that have impacted the way she does her job. She reports that her back pain.   Went to [employer] in January and had multiple panic attacks when rides were stuck.   Daughter is graduating in June and may be going to [employer]. Reports that daughter is doing well and they have been enjoying time together. Son has just completed hockey season and she had gotten to know some of the other moms.   Taking Klonopin prn several days a week. Reports that she is not taking Klonopin daily.   Klonopin last filled 01/26/23 x 2.   Past Psychiatric Medication Trials: Abilify- Tremor Latuda Vraylar- Irritability, Agitation Rexulti- Palpitations Lamictal Viibryd- panic Trintellix- HA's, possible headaches. Prozac Effexor XR- Caused manic s/s.  Zoloft Lexapro Wellbutrin-  Headaches Buspar Xanax- excessive somnolence, ineffective Clorazepate- ineffective Klonopin Ativan Trileptal-ineffective Topamax- severe dry mouth Trazodone- Has had dizziness and nausea with doses above 100 mg.  Deplin Propranolol-Ineffective Clonidine- May have been helpful for panic s/s. Caused drowsiness.  Gabapentin- Noticed restlessness with 900 mg.  Amitriptyline- Helpful for headaches and mood.   AUDIT    Flowsheet Row Office Visit from 07/04/2022 in [place] at [place]  Alcohol Use Disorder Identification Test Final Score (AUDIT) 3      PHQ2-9    [place] Office Visit from 11/09/2022 in [facility] at [place] from 07/04/2022 in [place] at [place] from 11/08/2021 in [place] at [place] from 01/10/2017 in [facility] at [place] from 10/07/2016 in Primary [facility] at [facility] Total Score 2 0 2 0 1  PHQ-9 Total Score 3 2 7  -- --      Flowsheet Row ED from 11/15/2022 in [facility] at [facility] Admission (Discharged) from 11/18/2021 in [place] No Risk No Risk        Review of Systems:  Review of Systems  Gastrointestinal:        Some chronic GI issues  Genitourinary:        Reports that ovarian cyst resolved  Musculoskeletal:  Positive for back pain. Negative for gait problem.  Allergic/Immunologic: Positive for environmental allergies.  Neurological:        Occ headaches  Psychiatric/Behavioral:  Please refer to HPI    Medications: I have reviewed the patient's current medications.  Current Outpatient Medications  Medication Sig Dispense Refill   albuterol (VENTOLIN HFA) 108 (90 Base) MCG/ACT inhaler INHALE 2 PUFFS INTO THE LUNGS EVERY 6 (SIX) HOURS AS NEEDED. FOR WHEEZE OR SHORTNESS OF BREATH 8.5 each 2   amitriptyline (ELAVIL) 50 MG tablet Take 1  tablet (50 mg total) by mouth at bedtime. 90 tablet 2   cetirizine (ZYRTEC) 10 MG tablet Take 10 mg by mouth daily as needed.      clobetasol ointment (TEMOVATE) [id number] % Apply 1 Application topically daily as needed. 30 g 1   Dextromethorphan-buPROPion ER (AUVELITY) 45-105 MG TBCR Take 1 tablet daily for 3 days, then increase to 1 tablet twice daily 60 tablet 0   esomeprazole (NEXIUM) 40 MG capsule Take 1 capsule (40 mg total) by mouth daily. 90 capsule 3   fluticasone (FLONASE) 50 MCG/ACT nasal spray Place into both nostrils daily as needed for allergies or rhinitis.     clonazePAM (KLONOPIN) 0.5 MG tablet Take 1 tablet (0.5 mg total) by mouth 2 (two) times daily as needed for anxiety. 60 tablet 5   No current facility-administered medications for this visit.    Medication Side Effects: None  Allergies:  Allergies  Allergen Reactions   Doxycycline Nausea And Vomiting    Other reaction(s): Vomiting    Past Medical History:  Diagnosis Date   Anxiety    Asthma    Constipation    Depression    Generalized headaches    GERD (gastroesophageal reflux disease)    IBS (irritable bowel syndrome)    Other and unspecified ovarian cysts    PONV (postoperative nausea and vomiting)    Pulse irregularity    Rapid heart rate    Seasonal allergies     Past Medical History, Surgical history, Social history, and Family history were reviewed and updated as appropriate.   Please see review of systems for further details on the patient's review from today.   Objective:   Physical Exam:  There were no vitals taken for this visit.  Physical Exam Constitutional:      General: She is not in acute distress. Musculoskeletal:        General: No deformity.  Neurological:     Mental Status: She is alert and oriented to person, place, and time.     Coordination: Coordination normal.  Psychiatric:        Attention and Perception: Attention and perception normal. She does not perceive auditory or  visual hallucinations.        Mood and Affect: Mood is anxious. Affect is not labile, blunt, angry or inappropriate.        Speech: Speech normal.        Behavior: Behavior normal.        Thought Content: Thought content normal. Thought content is not paranoid or delusional. Thought content does not include homicidal or suicidal ideation. Thought content does not include homicidal or suicidal plan.        Cognition and Memory: Cognition and memory normal.        Judgment: Judgment normal.     Comments: Insight intact Mood is mildly depressed     Lab Review:     Component Value Date/Time   NA 139 11/15/2022 0021   K 3.9 11/15/2022 0021   CL 106 11/15/2022 0021   CO2 22 11/15/2022 0021   GLUCOSE 114 (H) 11/15/2022 0021  BUN 14 11/15/2022 0021   CREATININE 0.73 11/15/2022 0021   CREATININE 0.69 10/28/2020 1118   CALCIUM 9.4 11/15/2022 0021   PROT 7.9 11/15/2022 0021   ALBUMIN 4.4 11/15/2022 0021   AST 16 11/15/2022 0021   ALT 12 11/15/2022 0021   ALKPHOS 75 11/15/2022 0021   BILITOT 0.5 11/15/2022 0021   GFRNONAA >60 11/15/2022 0021   GFRNONAA 109 10/28/2020 1118   GFRAA 126 10/28/2020 1118       Component Value Date/Time   WBC 12.5 (H) 11/15/2022 0021   RBC 4.52 11/15/2022 0021   HGB 13.9 11/15/2022 0021   HCT 38.6 11/15/2022 0021   PLT 316 11/15/2022 0021   MCV 85.4 11/15/2022 0021   MCH 30.8 11/15/2022 0021   MCHC 36.0 11/15/2022 0021   RDW 12.0 11/15/2022 0021    No results found for: "POCLITH", "LITHIUM"   No results found for: "PHENYTOIN", "PHENOBARB", "VALPROATE", "CBMZ"   .res Assessment: Plan:    Patient seen for 30 minutes and time spent discussing recent symptoms and possible treatment options, to include increasing amitriptyline.  Patient reports that she prefers not to increase amitriptyline since higher doses typically have not been beneficial for her and amitriptyline is improving panic at this dosage. Discussed potential benefits, risks, and side  effects of Auvelity. Will start Auvelity 45-105 mg one tablet daily for 3 days, then increase Auvelity to one tablet twice daily for depression. Will continue Klonopin 0.5 mg twice daily for anxiety and panic. Pt to follow-up in 3 months or sooner if clinically indicated.  Patient advised to contact office with any questions, adverse effects, or acute worsening in signs and symptoms.   Mischell was seen today for follow-up.  Diagnoses and all orders for this visit:  Mood disorder ([place]) -     Dextromethorphan-buPROPion ER (AUVELITY) 45-105 MG TBCR; Take 1 tablet daily for 3 days, then increase to 1 tablet twice daily  Panic disorder  Generalized anxiety disorder  Insomnia due to other mental disorder     Please see After Visit Summary for patient specific instructions.  Future Appointments  Date Time Provider [place]  03/16/2023 12:00 PM Starlyn Skeans, MD [facility] None  05/26/2023  9:30 AM Thayer Headings, PMHNP CP[facility] None  11/17/2023  8:30 AM Martinique, Betty G, MD [facility]    No orders of the defined types were placed in this encounter.   -------------------------------

## 2023-03-06 ENCOUNTER — Telehealth: Payer: Self-pay | Admitting: Psychiatry

## 2023-03-06 NOTE — Telephone Encounter (Signed)
Pt called at [facility]. Has been taking Auvelity and feels that she has gotten some benefit from it,however she also has side effects she cannot tolerate. Her fingers and toes and shaking, the med makes her feel high (not in a good way). She wonders about trying the Wellbutrin by itself again.  [facility] [address], [place]

## 2023-03-06 NOTE — Telephone Encounter (Signed)
Patient said she has taken Auvelity for 10-12 days, is now on 2 tabs. She said her mood was improved on the medication but she c/o feeling zoned out and twitching of hands/feet "like RLS". She said SE are not tolerable and she would like to take just Wellbutrin. She said sx increased when she started taking 2 tablets.  I don't see that patient has tried bupropion previously. She said she is somewhat treatment resistant and had been on multiple medications, but because she did feel improvement in her mood with the Auvelity she would like to just try the Wellbutrin by itself.

## 2023-03-07 NOTE — Telephone Encounter (Signed)
Patient said she will try the Auvelity once a day and let us know how she is doing.

## 2023-03-13 ENCOUNTER — Telehealth: Payer: Self-pay | Admitting: Psychiatry

## 2023-03-13 DIAGNOSIS — F39 Unspecified mood [affective] disorder: Secondary | ICD-10-CM

## 2023-03-13 MED ORDER — AUVELITY 45-105 MG PO TBCR: EXTENDED_RELEASE_TABLET | ORAL | 0 refills | Status: DC

## 2023-03-13 NOTE — Telephone Encounter (Signed)
Patient took one tab daily x3 days and then went up to 2. She said she felt zoned out and dropped it back down to one a day, but wants to go back to two a day to see if the zoned out feeling with subside with continued use. She said she has felt her mood has improved on Auvelity.  She asked that Rx be sent in. She still has samples but knows that it will probably require a PA. Rx sent.

## 2023-03-13 NOTE — Telephone Encounter (Signed)
Pt called at 9:13p.  She said Janett Billow gave her samples of Auvelity.  She would like the [facility] [address].  She started off taking 2 a day and felt "zoned out".  She backed down to 1 a day, but it didn't feel effective.  She would like to try to take 2 a day because she feels ike it may get better.    Next appt 6/14

## 2023-03-16 ENCOUNTER — Encounter (INDEPENDENT_AMBULATORY_CARE_PROVIDER_SITE_OTHER): Payer: Self-pay | Admitting: Family Medicine

## 2023-04-24 ENCOUNTER — Telehealth: Payer: Self-pay | Admitting: Psychiatry

## 2023-04-24 DIAGNOSIS — F39 Unspecified mood [affective] disorder: Secondary | ICD-10-CM

## 2023-04-24 MED ORDER — AUVELITY 45-105 MG PO TBCR: EXTENDED_RELEASE_TABLET | ORAL | 1 refills | Status: DC

## 2023-04-24 NOTE — Telephone Encounter (Signed)
Pt called and needs a refill on her auvelity er 45-105 mg. [facility] [address]

## 2023-04-24 NOTE — Telephone Encounter (Signed)
Script has been sent.

## 2023-05-26 ENCOUNTER — Ambulatory Visit: Payer: BC Managed Care – PPO | Admitting: Psychiatry

## 2023-05-26 ENCOUNTER — Telehealth: Payer: Self-pay

## 2023-05-26 ENCOUNTER — Encounter: Payer: Self-pay | Admitting: Psychiatry

## 2023-05-26 DIAGNOSIS — F411 Generalized anxiety disorder: Secondary | ICD-10-CM

## 2023-05-26 DIAGNOSIS — F5105 Insomnia due to other mental disorder: Secondary | ICD-10-CM | POA: Diagnosis not present

## 2023-05-26 DIAGNOSIS — F99 Mental disorder, not otherwise specified: Secondary | ICD-10-CM

## 2023-05-26 DIAGNOSIS — F410 Panic disorder [episodic paroxysmal anxiety] without agoraphobia: Secondary | ICD-10-CM

## 2023-05-26 DIAGNOSIS — F39 Unspecified mood [affective] disorder: Secondary | ICD-10-CM | POA: Diagnosis not present

## 2023-05-26 MED ORDER — AUVELITY 45-105 MG PO TBCR: EXTENDED_RELEASE_TABLET | ORAL | 1 refills | Status: DC

## 2023-05-26 MED ORDER — AMITRIPTYLINE HCL 50 MG PO TABS: 50.0000 mg | ORAL_TABLET | Freq: Every day | ORAL | 2 refills | Status: DC

## 2023-05-26 MED ORDER — CLONAZEPAM 0.5 MG PO TABS: 0.5000 mg | ORAL_TABLET | Freq: Two times a day (BID) | ORAL | 5 refills | Status: DC | PRN

## 2023-05-26 NOTE — Telephone Encounter (Signed)
Prior Authorization [employer]   #[id number] [employer]

## 2023-05-26 NOTE — Progress Notes (Signed)
Danielle [name] [id number] [birth year] 42 y.o.  Subjective:   Patient ID:  Danielle [name] is a 42 y.o. (DOB June 18, 1981) female.  Chief Complaint:  Chief Complaint  Patient presents with   Anxiety   Follow-up    Depression    HPI Danielle [name] presents to the office today for follow-up of anxiety and mood disturbance.   She reports feeling, "really good... life changing amazing." She reports that her mood improved within 3 days of starting Auvelity. She reports that in the first few weeks she noticed slightly elevated mood. She reports more energy and motivation. She reports mood has "gone down a little bit."   She is walking everyday "because I feel like I want to." She notices improved self-care. She has been wanting to take care of plants. Has been wanting to shop more. She is planning things with her friends and family more. Has been socializing more. "Feel more present." Denies impulsive or risky behavior. Sleep has been "ok." Occ difficulty falling asleep. Sleeping ok most nights. Concentration has been ok. Appetite has been less. She reports that she continues to enjoy food and feels she is eating an adequate amount. Denies SI.   She reports that she continues to have some panic symptoms without recent full blown panic attack. She had some panic symptoms during a funeral where she felt like she could not leave the situation. Feeling trapped is a trigger to panic. She avoids a certain highway where she has had panic attacks. She continues to have generalized anxiety. She reports anxiety in some situations.    Past Psychiatric Medication Trials: Abilify- Tremor Latuda Vraylar- Irritability, Agitation Rexulti- Palpitations Lamictal Viibryd- panic Trintellix- HA's, possible headaches. Prozac Effexor XR- Caused manic s/s.  Zoloft Lexapro Wellbutrin- Headaches Buspar Xanax- excessive somnolence, ineffective Clorazepate-  ineffective Klonopin Ativan Trileptal-ineffective Topamax- severe dry mouth Trazodone- Has had dizziness and nausea with doses above 100 mg.  Deplin Propranolol-Ineffective Clonidine- May have been helpful for panic s/s. Caused drowsiness.  Gabapentin- Noticed restlessness with 900 mg.  Amitriptyline- Helpful for headaches and mood.   AUDIT    Flowsheet Row Office Visit from 07/04/2022 in [facility] at [place]  Alcohol Use Disorder Identification Test Final Score (AUDIT) 3      PHQ2-9    Flowsheet Row Office Visit from 11/09/2022 in [facility] [place] Office Visit from 07/04/2022 in [facility] [place] Office Visit from 11/08/2021 in [facility] at [place] Office Visit from 01/10/2017 in Primary Care at [place] Office Visit from 10/07/2016 in Primary Care at [facility] Total Score 2 0 2 0 1  PHQ-9 Total Score 3 2 7  -- --      Flowsheet Row ED from 11/15/2022 in [facility] Emergency Department at [facility] Admission (Discharged) from 11/18/2021 in [facility]  C-SSRS RISK CATEGORY No Risk No Risk        Review of Systems:  Review of Systems  Musculoskeletal:  Negative for gait problem.       Reports improved back pain with accupuncture  Neurological:        Occ muscle twitches  Psychiatric/Behavioral:         Please refer to HPI    Medications: I have reviewed the patient's current medications.  Current Outpatient Medications  Medication Sig Dispense Refill   albuterol (VENTOLIN HFA) 108 (90 Base) MCG/ACT inhaler INHALE 2 PUFFS INTO THE LUNGS EVERY 6 (SIX) HOURS AS NEEDED. FOR  WHEEZE OR SHORTNESS OF BREATH 8.5 each 2   clobetasol ointment (TEMOVATE) 0.05 % Apply 1 Application topically daily as needed. 30 g 1   esomeprazole (NEXIUM) 40 MG capsule Take 1 capsule (40 mg total) by mouth daily. 90 capsule 3   amitriptyline (ELAVIL) 50 MG tablet Take 1 tablet (50 mg total)  by mouth at bedtime. 90 tablet 2   cetirizine (ZYRTEC) 10 MG tablet Take 10 mg by mouth daily as needed.  (Patient not taking: Reported on 05/26/2023)     clonazePAM (KLONOPIN) 0.5 MG tablet Take 1 tablet (0.5 mg total) by mouth 2 (two) times daily as needed for anxiety. 60 tablet 5   Dextromethorphan-buPROPion ER (AUVELITY) 45-105 MG TBCR Take 1 tablet twice daily 180 tablet 1   fluticasone (FLONASE) 50 MCG/ACT nasal spray Place into both nostrils daily as needed for allergies or rhinitis. (Patient not taking: Reported on 05/26/2023)     No current facility-administered medications for this visit.    Medication Side Effects: Other: Dry mouth, occ muscle twitches  Allergies:  Allergies  Allergen Reactions   Doxycycline Nausea And Vomiting    Other reaction(s): Vomiting    Past Medical History:  Diagnosis Date   Anxiety    Asthma    Constipation    Depression    Generalized headaches    GERD (gastroesophageal reflux disease)    IBS (irritable bowel syndrome)    Other and unspecified ovarian cysts    PONV (postoperative nausea and vomiting)    Pulse irregularity    Rapid heart rate    Seasonal allergies     Past Medical History, Surgical history, Social history, and Family history were reviewed and updated as appropriate.   Please see review of systems for further details on the patient's review from today.   Objective:   Physical Exam:  BP 123/80   Pulse 95   Physical Exam Constitutional:      General: She is not in acute distress. Musculoskeletal:        General: No deformity.  Neurological:     Mental Status: She is alert and oriented to person, place, and time.     Coordination: Coordination normal.  Psychiatric:        Attention and Perception: Attention and perception normal. She does not perceive auditory or visual hallucinations.        Mood and Affect: Mood normal. Mood is not anxious or depressed. Affect is not labile, blunt, angry or inappropriate.         Speech: Speech normal.        Behavior: Behavior normal.        Thought Content: Thought content normal. Thought content is not paranoid or delusional. Thought content does not include homicidal or suicidal ideation. Thought content does not include homicidal or suicidal plan.        Cognition and Memory: Cognition and memory normal.        Judgment: Judgment normal.     Comments: Insight intact     Lab Review:     Component Value Date/Time   NA 139 11/15/2022 0021   K 3.9 11/15/2022 0021   CL 106 11/15/2022 0021   CO2 22 11/15/2022 0021   GLUCOSE 114 (H) 11/15/2022 0021   BUN 14 11/15/2022 0021   CREATININE 0.73 11/15/2022 0021   CREATININE 0.69 10/28/2020 1118   CALCIUM 9.4 11/15/2022 0021   PROT 7.9 11/15/2022 0021   ALBUMIN 4.4 11/15/2022 0021   AST 16 11/15/2022  0021   ALT 12 11/15/2022 0021   ALKPHOS 75 11/15/2022 0021   BILITOT 0.5 11/15/2022 0021   GFRNONAA >60 11/15/2022 0021   GFRNONAA 109 10/28/2020 1118   GFRAA 126 10/28/2020 1118       Component Value Date/Time   WBC 12.5 (H) 11/15/2022 0021   RBC 4.52 11/15/2022 0021   HGB 13.9 11/15/2022 0021   HCT 38.6 11/15/2022 0021   PLT 316 11/15/2022 0021   MCV 85.4 11/15/2022 0021   MCH 30.8 11/15/2022 0021   MCHC 36.0 11/15/2022 0021   RDW 12.0 11/15/2022 0021    No results found for: "POCLITH", "LITHIUM"   No results found for: "PHENYTOIN", "PHENOBARB", "VALPROATE", "CBMZ"   .res Assessment: Plan:    Will continue current plan of care since target signs and symptoms are well controlled without any tolerability issues. Will continue Auvelity 45-105 mg one tablet twice daily for depression. Continue Amitriptyline 50 mg po QHS since this has been helpful for her mood, IBS, and pain.  Continue Klonopin 0.5 mg po BID prn anxiety.  Pt to follow-up in 6 months or sooner if clinically indicated.  Patient advised to contact office with any questions, adverse effects, or acute worsening in signs and  symptoms.   Danielle [name] was seen today for anxiety and follow-up.  Diagnoses and all orders for this visit:  Mood disorder (HCC) -     Dextromethorphan-buPROPion ER (AUVELITY) 45-105 MG TBCR; Take 1 tablet twice daily -     amitriptyline (ELAVIL) 50 MG tablet; Take 1 tablet (50 mg total) by mouth at bedtime.  Generalized anxiety disorder -     clonazePAM (KLONOPIN) 0.5 MG tablet; Take 1 tablet (0.5 mg total) by mouth 2 (two) times daily as needed for anxiety. -     amitriptyline (ELAVIL) 50 MG tablet; Take 1 tablet (50 mg total) by mouth at bedtime.  Panic disorder -     clonazePAM (KLONOPIN) 0.5 MG tablet; Take 1 tablet (0.5 mg total) by mouth 2 (two) times daily as needed for anxiety.  Insomnia due to other mental disorder -     clonazePAM (KLONOPIN) 0.5 MG tablet; Take 1 tablet (0.5 mg total) by mouth 2 (two) times daily as needed for anxiety. -     amitriptyline (ELAVIL) 50 MG tablet; Take 1 tablet (50 mg total) by mouth at bedtime.     Please see After Visit Summary for patient specific instructions.  Future Appointments  Date Time Provider Department Center  11/17/2023  8:30 AM [place], Betty G, MD [facility]  11/24/2023  9:00 AM Corie Chiquito, PMHNP [facility] None    No orders of the defined types were placed in this encounter.   -------------------------------

## 2023-05-26 NOTE — Telephone Encounter (Signed)
PA submitted with [employer] EOC ID [id number] for Auvelity 45-105 mg sent for urgent review.  Member ID #[id number]

## 2023-06-01 NOTE — Telephone Encounter (Signed)
PA from [employer] for Danielle [name] is a denial on 05/26/2023, waiting to see the reasons once faxed and reviewed.  Pt should still be able to have access to medication with the savings card, highly recommended she use [facility] or [facility] if possible to aid in getting the medication.

## 2023-06-07 DIAGNOSIS — D485 Neoplasm of uncertain behavior of skin: Secondary | ICD-10-CM | POA: Diagnosis not present

## 2023-06-07 DIAGNOSIS — L821 Other seborrheic keratosis: Secondary | ICD-10-CM | POA: Diagnosis not present

## 2023-07-05 ENCOUNTER — Telehealth: Payer: Self-pay | Admitting: Psychiatry

## 2023-07-05 NOTE — Telephone Encounter (Signed)
Next appt is 11/24/23. Danielle [name] has been on Auvility since March and has been taking 2/day. About a week ago she didn't feel as good as she had been before. Can she go down to 1/day? Pharmacy is:   [id number] [employer] [place], [place] - [address]   Phone: [phone]  Fax: [phone]    Her phone number is [phone].

## 2023-07-05 NOTE — Telephone Encounter (Signed)
Patient reporting that Danielle [name] is the only medication that has helped with her depression and mood. She is reporting that she doesn't feel as good now as she has. Wonders if she has become tolerant to it. Doesn't think she was ever on bupropion alone, though note says she was and it caused headaches. Has FU 12/13.   From 6/14 note:  She reports feeling, "really good... life changing amazing." She reports that her mood improved within 3 days of starting Auvelity. She reports that in the first few weeks she noticed slightly elevated mood. She reports more energy and motivation. She reports mood has "gone down a little

## 2023-07-06 NOTE — Telephone Encounter (Signed)
Patient notified

## 2023-07-06 NOTE — Telephone Encounter (Signed)
Patient just asking about reducing Auvelity, not asking for bupropion.

## 2023-07-20 ENCOUNTER — Telehealth: Payer: Self-pay | Admitting: Psychiatry

## 2023-07-20 DIAGNOSIS — F39 Unspecified mood [affective] disorder: Secondary | ICD-10-CM

## 2023-07-20 MED ORDER — BUPROPION HCL ER (SR) 100 MG PO TB12: 100.0000 mg | ORAL_TABLET | Freq: Every morning | ORAL | 1 refills | Status: DC

## 2023-07-20 NOTE — Telephone Encounter (Signed)
Next appt is 11/24/23. She states that her Auvility 45 mg twice daily isn't helping her. Says her mood was better in the beginning and then changed. Danielle [name] asks if she can try the Wellbutrin to it and see if it makes her feel better. Phone number is [phone].  Pharmacy is:  [id number] [place], [place] - [address]   Phone: [phone]  Fax: [phone]

## 2023-07-20 NOTE — Telephone Encounter (Signed)
Please let her know that script was sent for Wellbutrin SR 100 mg in the morning. Headache is a common side effect and often resolves after a few days. Please advise her to call if she is experiencing frequent headaches that do not resolve.

## 2023-07-20 NOTE — Telephone Encounter (Signed)
See message from patient. I asked her if she wanted to try stopping the Auvelity and switching to bupropion alone and she said not. She said Auvelity is the only thing that has helped her, but it is just not as effective as it was initially. She said she thought you had mentioned being able to add a small dose of bupropion in addition to the [place] that might be helpful.

## 2023-07-21 NOTE — Telephone Encounter (Signed)
Patient notified

## 2023-07-25 ENCOUNTER — Telehealth: Payer: Self-pay

## 2023-07-25 MED ORDER — LEVALBUTEROL TARTRATE 45 MCG/ACT IN AERO: 2.0000 | INHALATION_SPRAY | Freq: Four times a day (QID) | RESPIRATORY_TRACT | 0 refills | Status: DC | PRN

## 2023-07-25 NOTE — Telephone Encounter (Signed)
Received fax from pharmacy that proair is no longer covered; recommended insurance option is Levalbuterol 45 mcg. Okay to change?

## 2023-07-25 NOTE — Addendum Note (Signed)
Addended by: Kathreen Devoid on: 07/25/2023 01:13 PM   Modules accepted: Orders

## 2023-07-25 NOTE — Telephone Encounter (Signed)
Rx sent 

## 2023-07-25 NOTE — Telephone Encounter (Signed)
Yes ok 

## 2023-08-13 ENCOUNTER — Other Ambulatory Visit: Payer: Self-pay | Admitting: Psychiatry

## 2023-08-13 DIAGNOSIS — F39 Unspecified mood [affective] disorder: Secondary | ICD-10-CM

## 2023-09-07 DIAGNOSIS — Z1151 Encounter for screening for human papillomavirus (HPV): Secondary | ICD-10-CM | POA: Diagnosis not present

## 2023-09-07 DIAGNOSIS — Z124 Encounter for screening for malignant neoplasm of cervix: Secondary | ICD-10-CM | POA: Diagnosis not present

## 2023-09-07 DIAGNOSIS — Z6829 Body mass index (BMI) 29.0-29.9, adult: Secondary | ICD-10-CM | POA: Diagnosis not present

## 2023-09-07 DIAGNOSIS — Z01419 Encounter for gynecological examination (general) (routine) without abnormal findings: Secondary | ICD-10-CM | POA: Diagnosis not present

## 2023-09-07 DIAGNOSIS — Z1231 Encounter for screening mammogram for malignant neoplasm of breast: Secondary | ICD-10-CM | POA: Diagnosis not present

## 2023-09-15 ENCOUNTER — Other Ambulatory Visit: Payer: Self-pay | Admitting: Family Medicine

## 2023-09-15 ENCOUNTER — Other Ambulatory Visit: Payer: Self-pay | Admitting: Psychiatry

## 2023-09-15 DIAGNOSIS — F39 Unspecified mood [affective] disorder: Secondary | ICD-10-CM

## 2023-10-02 ENCOUNTER — Ambulatory Visit: Payer: BC Managed Care – PPO | Admitting: Psychiatry

## 2023-10-02 ENCOUNTER — Encounter: Payer: Self-pay | Admitting: Psychiatry

## 2023-10-02 DIAGNOSIS — F99 Mental disorder, not otherwise specified: Secondary | ICD-10-CM

## 2023-10-02 DIAGNOSIS — F410 Panic disorder [episodic paroxysmal anxiety] without agoraphobia: Secondary | ICD-10-CM

## 2023-10-02 DIAGNOSIS — F5105 Insomnia due to other mental disorder: Secondary | ICD-10-CM | POA: Diagnosis not present

## 2023-10-02 DIAGNOSIS — F411 Generalized anxiety disorder: Secondary | ICD-10-CM

## 2023-10-02 DIAGNOSIS — F39 Unspecified mood [affective] disorder: Secondary | ICD-10-CM

## 2023-10-02 MED ORDER — PRAMIPEXOLE DIHYDROCHLORIDE 0.5 MG PO TABS: ORAL_TABLET | ORAL | 1 refills | Status: DC

## 2023-10-02 NOTE — Progress Notes (Signed)
Danielle [name] [id number] [birth year] 42 y.o.  Subjective:   Patient ID:  Danielle [name] is a 42 y.o. (DOB [birth year]) female.  Chief Complaint:  Chief Complaint  Patient presents with   Depression    HPI [name] presents to the office today for follow-up of depression, anxiety, and insomnia. She reports, "The med stopped working." She thinks she was having "really high highs, and really low lows" while taking Auvelity. She reports that she had periods of increased energy, irritability, and doing more things (making plans, going out with friends). Denies impulsive or risky behavior. She reports "this low is the worst" after having period of improved mood. She has not taken Auvelity for 4 days and has continued Wellbutrin SR. She has been having headaches since stopping medications.   She has been having uncontrolled crying today. She reports situational depression in response to grieving improved mood. Energy and motivation have been very low. Denies current irritability. She reports that her anxiety is "the same." She continues to avoid driving in certain locations due to anxiety. Denies difficulty going to sleep and reports that she was sleeping less with Auvelity (6-7 hours a night). She is now falling asleep during the day. Concentration has been affected due to rumination. "I'm just barely functioning." She reports diminished enjoyment in things. Losing interest in things. Appetite had been less with Auvelity initially and appetite has increased some. Denies SI.  Denies any acute stressors. She reports that her husband has difficulty understanding depression. Daughter and brother are supportive.  Past Psychiatric Medication Trials: Abilify- Tremor Latuda Vraylar- Irritability, Agitation Rexulti- Palpitations Lamictal Viibryd- panic Trintellix- HA's, possible headaches. Prozac Effexor XR- Caused manic s/s.  Zoloft Lexapro Auvelity Wellbutrin-  Headaches Buspar Xanax- excessive somnolence, ineffective Clorazepate- ineffective Klonopin Ativan Trileptal-ineffective Topamax- severe dry mouth Trazodone- Has had dizziness and nausea with doses above 100 mg.  Deplin Propranolol-Ineffective Clonidine- May have been helpful for panic s/s. Caused drowsiness.  Gabapentin- Noticed restlessness with 900 mg.  Amitriptyline- Helpful for headaches and mood.   AUDIT    Flowsheet Row Office Visit from 07/04/2022 in [facility] [place]  Alcohol Use Disorder Identification Test Final Score (AUDIT) 3      PHQ2-9    Flowsheet Row Office Visit from 11/09/2022 in [facility] [place] Office Visit from 07/04/2022 in [facility] [place] Office Visit from 11/08/2021 in [facility] [place] Office Visit from 01/10/2017 in Primary Care at [place] Office Visit from 10/07/2016 in Primary Care at [facility] Total Score 2 0 2 0 1  PHQ-9 Total Score 3 2 7  -- --      Flowsheet Row ED from 11/15/2022 in [facility] Emergency Department at [facility] Admission (Discharged) from 11/18/2021 in [facility]  C-SSRS RISK CATEGORY No Risk No Risk        Review of Systems:  Review of Systems  Musculoskeletal:  Negative for gait problem.  Neurological:  Positive for headaches. Negative for tremors.  Psychiatric/Behavioral:         Please refer to HPI    Medications: I have reviewed the patient's current medications.  Current Outpatient Medications  Medication Sig Dispense Refill   amitriptyline (ELAVIL) 50 MG tablet Take 1 tablet (50 mg total) by mouth at bedtime. 90 tablet 2   buPROPion ER (WELLBUTRIN SR) 100 MG 12 hr tablet TAKE 1 TABLET BY MOUTH EVERY DAY IN THE MORNING 90 tablet 0  esomeprazole (NEXIUM) 40 MG capsule Take 1 capsule (40 mg total) by mouth daily. 90 capsule 3   levalbuterol (XOPENEX HFA) 45 MCG/ACT inhaler Inhale 2 puffs into the  lungs every 6 (six) hours as needed for wheezing or shortness of breath. 1 each 0   pramipexole (MIRAPEX) 0.5 MG tablet Take 1/2 tablet twice daily for one week, then increase to 1 tablet 60 tablet 1   cetirizine (ZYRTEC) 10 MG tablet Take 10 mg by mouth daily as needed.  (Patient not taking: Reported on 05/26/2023)     clobetasol ointment (TEMOVATE) 0.05 % Apply 1 Application topically daily as needed. 30 g 1   clonazePAM (KLONOPIN) 0.5 MG tablet Take 1 tablet (0.5 mg total) by mouth 2 (two) times daily as needed for anxiety. 60 tablet 5   fluticasone (FLONASE) 50 MCG/ACT nasal spray Place into both nostrils daily as needed for allergies or rhinitis. (Patient not taking: Reported on 05/26/2023)     No current facility-administered medications for this visit.    Medication Side Effects: None  Allergies:  Allergies  Allergen Reactions   Doxycycline Nausea And Vomiting    Other reaction(s): Vomiting    Past Medical History:  Diagnosis Date   Anxiety    Asthma    Constipation    Depression    Generalized headaches    GERD (gastroesophageal reflux disease)    IBS (irritable bowel syndrome)    Other and unspecified ovarian cysts    PONV (postoperative nausea and vomiting)    Pulse irregularity    Rapid heart rate    Seasonal allergies     Past Medical History, Surgical history, Social history, and Family history were reviewed and updated as appropriate.   Please see review of systems for further details on the patient's review from today.   Objective:   Physical Exam:  There were no vitals taken for this visit.  Physical Exam Constitutional:      General: She is not in acute distress. Musculoskeletal:        General: No deformity.  Neurological:     Mental Status: She is alert and oriented to person, place, and time.     Coordination: Coordination normal.  Psychiatric:        Attention and Perception: Attention and perception normal. She does not perceive auditory or  visual hallucinations.        Mood and Affect: Mood is anxious and depressed. Affect is tearful. Affect is not labile, blunt, angry or inappropriate.        Speech: Speech normal.        Behavior: Behavior normal.        Thought Content: Thought content normal. Thought content is not paranoid or delusional. Thought content does not include homicidal or suicidal ideation. Thought content does not include homicidal or suicidal plan.        Cognition and Memory: Cognition and memory normal.        Judgment: Judgment normal.     Comments: Insight intact     Lab Review:     Component Value Date/Time   NA 139 11/15/2022 0021   K 3.9 11/15/2022 0021   CL 106 11/15/2022 0021   CO2 22 11/15/2022 0021   GLUCOSE 114 (H) 11/15/2022 0021   BUN 14 11/15/2022 0021   CREATININE 0.73 11/15/2022 0021   CREATININE 0.69 10/28/2020 1118   CALCIUM 9.4 11/15/2022 0021   PROT 7.9 11/15/2022 0021   ALBUMIN 4.4 11/15/2022 0021   AST  16 11/15/2022 0021   ALT 12 11/15/2022 0021   ALKPHOS 75 11/15/2022 0021   BILITOT 0.5 11/15/2022 0021   GFRNONAA >60 11/15/2022 0021   GFRNONAA 109 10/28/2020 1118   GFRAA 126 10/28/2020 1118       Component Value Date/Time   WBC 12.5 (H) 11/15/2022 0021   RBC 4.52 11/15/2022 0021   HGB 13.9 11/15/2022 0021   HCT 38.6 11/15/2022 0021   PLT 316 11/15/2022 0021   MCV 85.4 11/15/2022 0021   MCH 30.8 11/15/2022 0021   MCHC 36.0 11/15/2022 0021   RDW 12.0 11/15/2022 0021    No results found for: "POCLITH", "LITHIUM"   No results found for: "PHENYTOIN", "PHENOBARB", "VALPROATE", "CBMZ"   .res Assessment: Plan:    Pt seen for 35 minutes and time spent discussing response to Auvelity and worsening symptoms and possible discontinuation s/s with stopping Auvelity. Pt requests alternative to Auvelity to improve depression. Discussed possible treatment options, to include using Pramipexole off-label for depression since she has not experienced any benefit with SSRI's and  SNRI's in the past. Pt is in agreement with trial of Pramipexole. Will start Pramipexole 0.25 mg po BID for one week, then increase to 0.5 mg po BID. Discussed that dose may need to be titrated further for therapeutic response.  Will continue Wellbutrin SR 100 mg daily for depression. May consider changing to Wellbutrin XL 150 mg daily in the future if appropriate.  Continue Amitriptyline 50 mg at bedtime for depression.  Continue Klonopin as needed for panic.  Pt to follow-up with this provider in 6 weeks or sooner if clinically indicated.  Patient advised to contact office with any questions, adverse effects, or acute worsening in signs and symptoms.   Zandra was seen today for depression.  Diagnoses and all orders for this visit:  Mood disorder (HCC) -     pramipexole (MIRAPEX) 0.5 MG tablet; Take 1/2 tablet twice daily for one week, then increase to 1 tablet  Generalized anxiety disorder  Panic disorder  Insomnia due to other mental disorder     Please see After Visit Summary for patient specific instructions.  Future Appointments  Date Time Provider Department Center  11/17/2023  8:30 AM [place], Betty G, MD [facility]  11/24/2023  9:00 AM Corie Chiquito, PMHNP [facility] None    No orders of the defined types were placed in this encounter.   -------------------------------

## 2023-10-12 ENCOUNTER — Telehealth: Payer: Self-pay | Admitting: Psychiatry

## 2023-10-12 DIAGNOSIS — F39 Unspecified mood [affective] disorder: Secondary | ICD-10-CM

## 2023-10-12 MED ORDER — BUPROPION HCL ER (XL) 150 MG PO TB24: 150.0000 mg | ORAL_TABLET | Freq: Every day | ORAL | 2 refills | Status: DC

## 2023-10-12 NOTE — Telephone Encounter (Signed)
Patient notified

## 2023-10-12 NOTE — Telephone Encounter (Signed)
Pt reporting issues with Mirapex. She has been on a full tablet 3 days. She is reporting extreme mood swings and inconsolable crying for no apparent reason. She said you mentioned increasing dose of Wellbutrin at 10/21 visit.

## 2023-10-12 NOTE — Telephone Encounter (Signed)
Recommend discontinuing Pramipexole. Script sent for Wellbutrin XL 150 mg daily. Recommend waiting to start Wellbutrin XL until Pramipexole side effects have resolved. Pramipexole has a short half-life and would expect side effects to resolve quickly.

## 2023-10-12 NOTE — Telephone Encounter (Signed)
Next appt is 11/24/23. Danielle [name] called stating that her Mirapex 0.5 mg is causing her to have mood swings and depression. She wants to know if she can increase the dose and if so how much? Her phone number is [phone].

## 2023-10-19 ENCOUNTER — Ambulatory Visit: Payer: BC Managed Care – PPO | Admitting: Psychiatry

## 2023-10-25 ENCOUNTER — Encounter: Payer: Self-pay | Admitting: Psychiatry

## 2023-10-26 ENCOUNTER — Other Ambulatory Visit: Payer: Self-pay | Admitting: Psychiatry

## 2023-10-26 DIAGNOSIS — F39 Unspecified mood [affective] disorder: Secondary | ICD-10-CM

## 2023-10-27 DIAGNOSIS — L821 Other seborrheic keratosis: Secondary | ICD-10-CM | POA: Diagnosis not present

## 2023-10-27 DIAGNOSIS — L578 Other skin changes due to chronic exposure to nonionizing radiation: Secondary | ICD-10-CM | POA: Diagnosis not present

## 2023-10-27 DIAGNOSIS — L719 Rosacea, unspecified: Secondary | ICD-10-CM | POA: Diagnosis not present

## 2023-10-27 DIAGNOSIS — D225 Melanocytic nevi of trunk: Secondary | ICD-10-CM | POA: Diagnosis not present

## 2023-10-27 IMAGING — MG MM PLC BREAST LOC DEV 1ST LESION INC*R*
6 series · 6 of 6 positions shown · non-contrast
Comparison: Previous exam(s).

CLINICAL DATA: 40-year-old with a biopsy-proven papilloma involving
the UPPER OUTER QUADRANT of the RIGHT breast at middle depth. At the
time of biopsy, the X shaped tissue marking clip migrated
approximally 2.3 cm posteroinferior to the biopsied mass. (The
patient had a benign biopsy of a second lesion at that time which is
associated with a coil clip).

EXAM:
MAMMOGRAPHIC GUIDED RADIOACTIVE SEED LOCALIZATION OF THE RIGHT
BREAST

[R LM (1 of 3)]
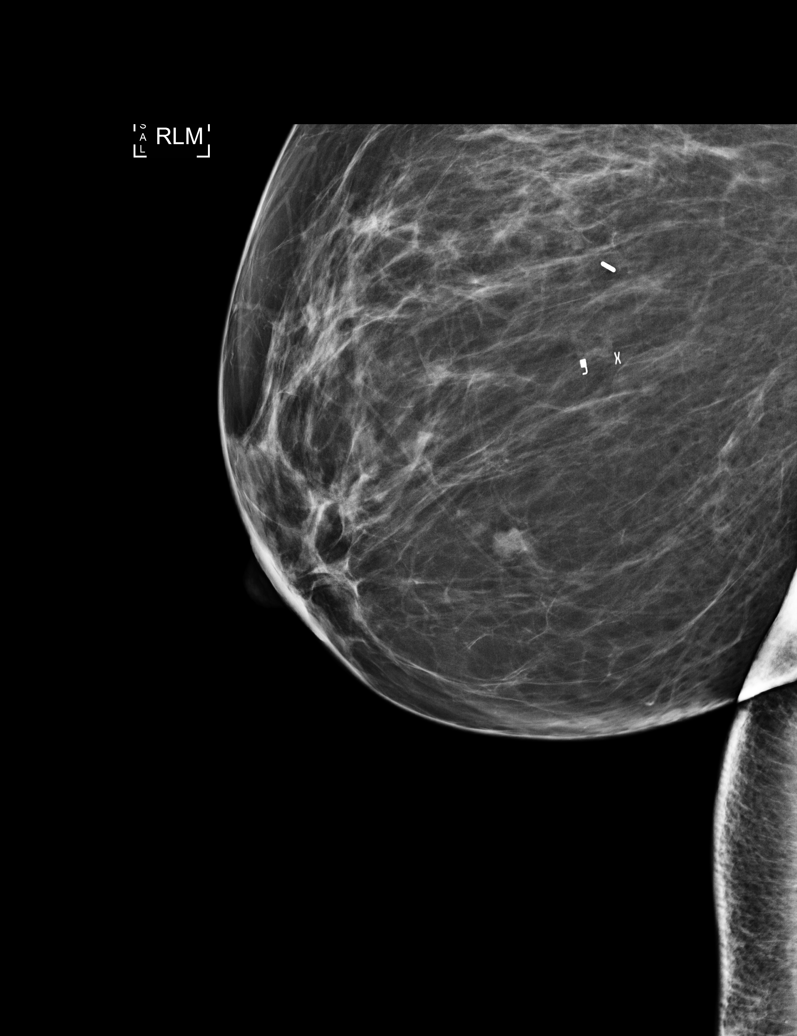

[R CC (1 of 3)]
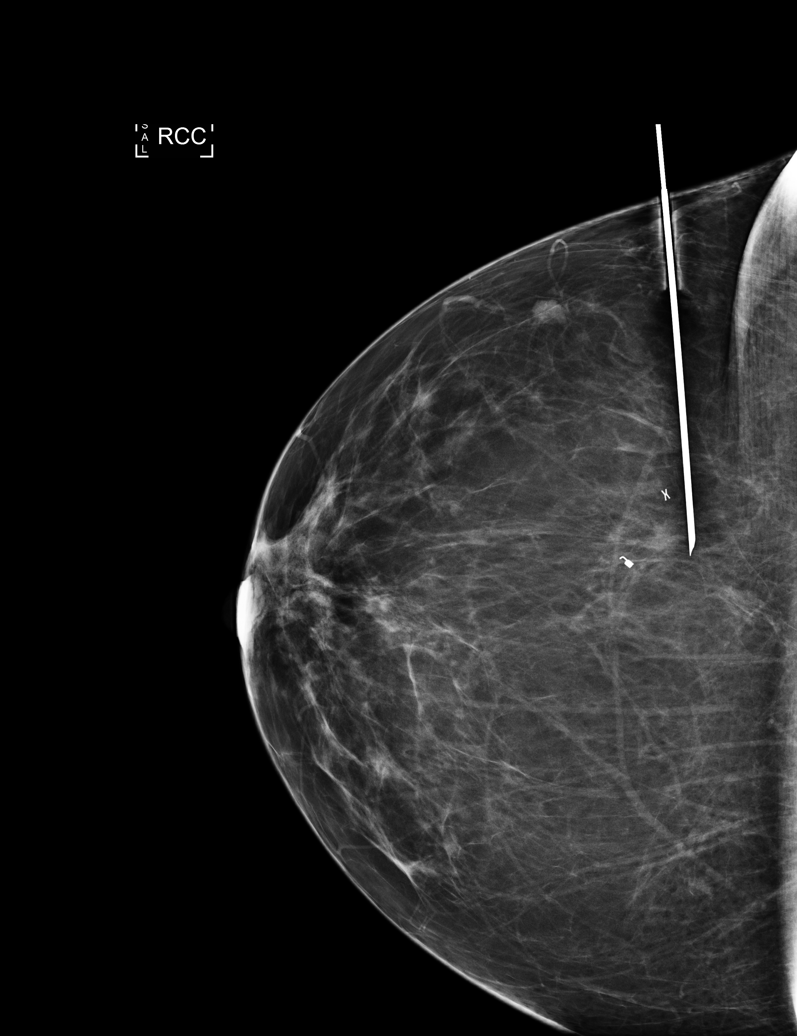

[R CC (2 of 3)]
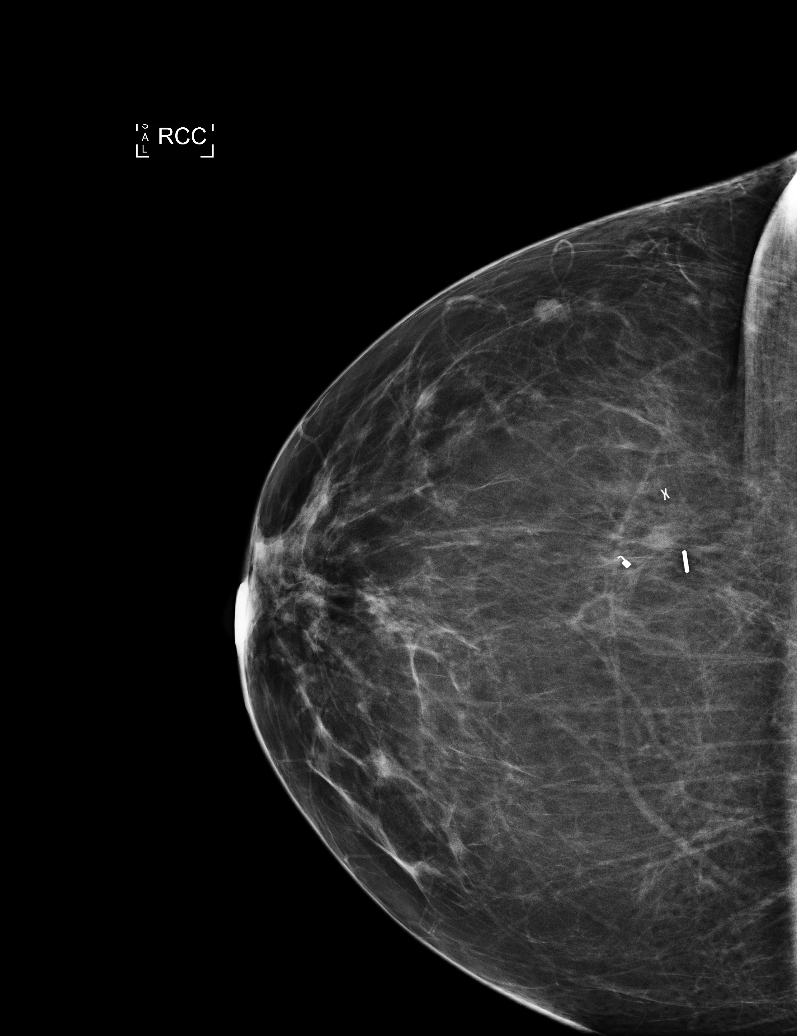

[R LM (2 of 3)]
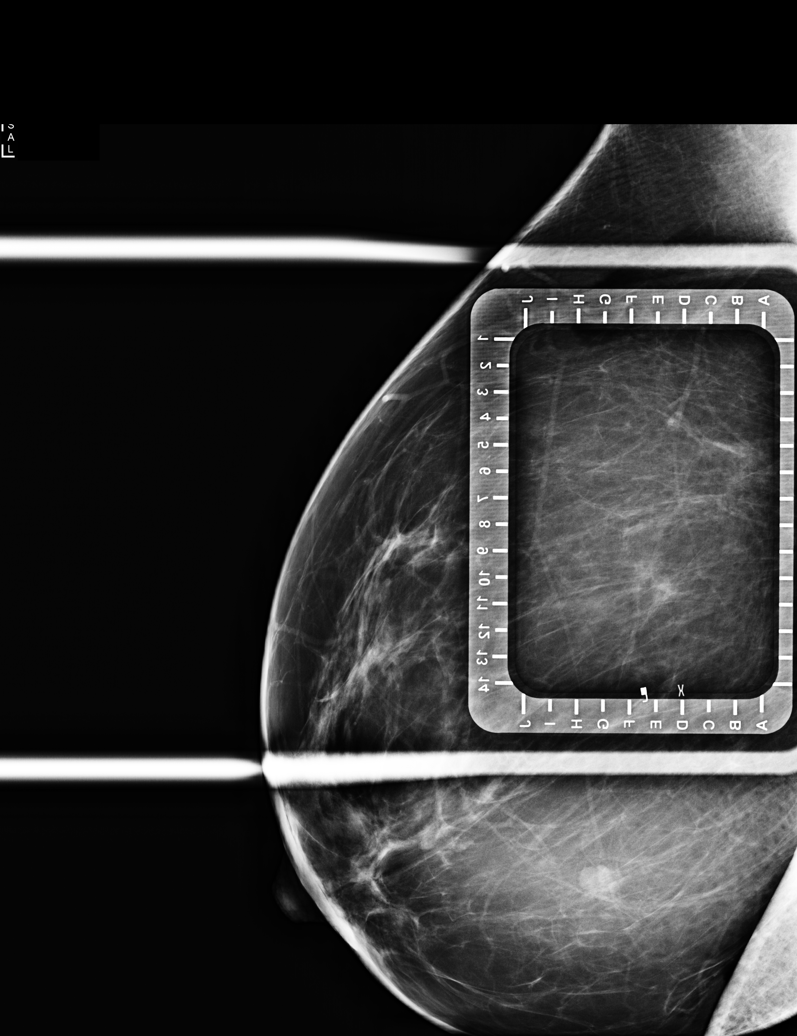

[R CC (3 of 3)]
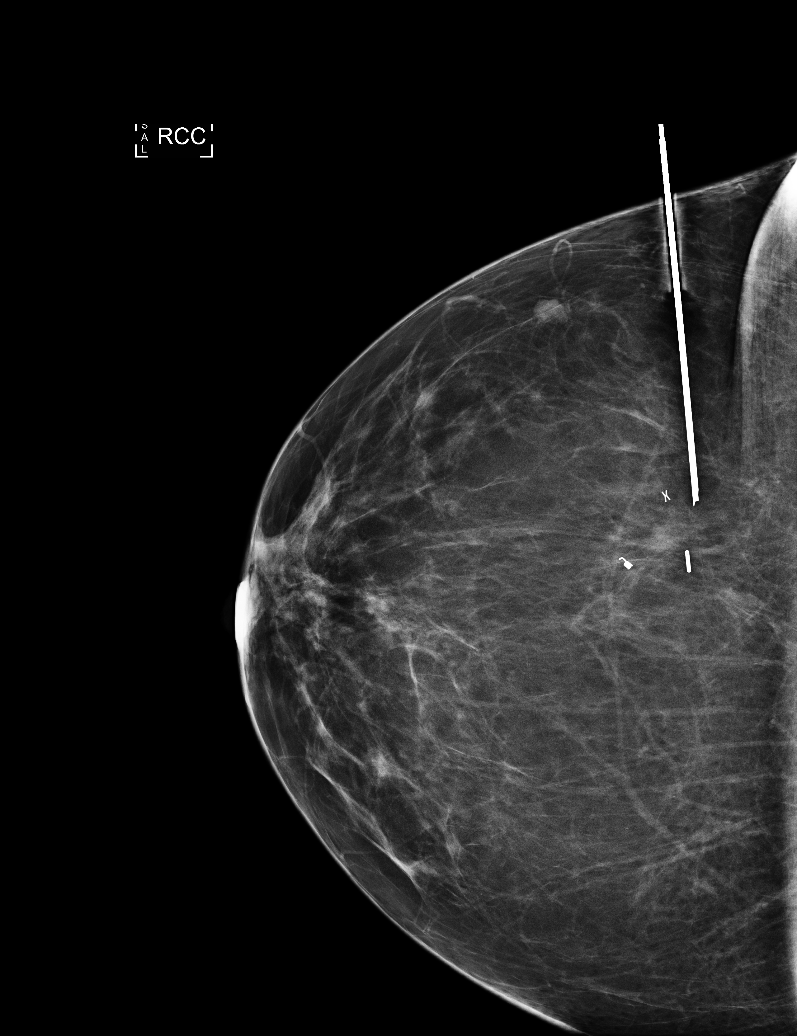

[R LM (3 of 3)]
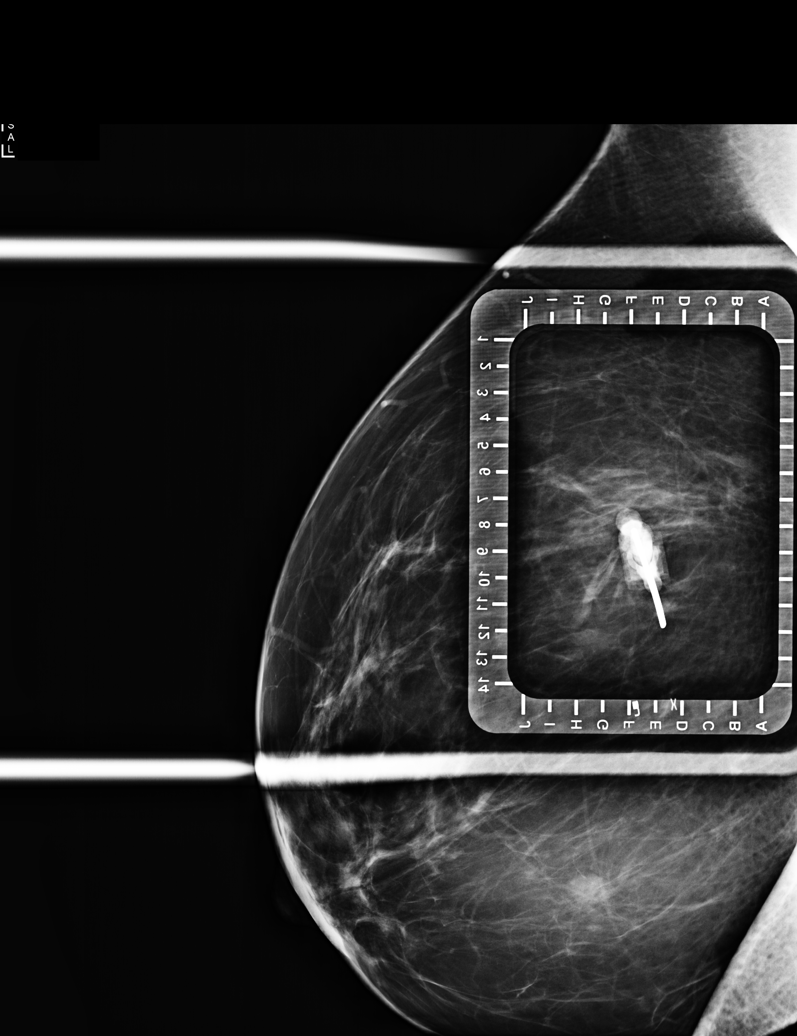

[6 of 6 positions shown; findings below may reference images not displayed]

FINDINGS: Patient presents for radioactive seed localization prior to
excisional RIGHT breast biopsy. I met with the patient and we
discussed the procedure of seed localization including benefits and
alternatives. We discussed the high likelihood of a successful
procedure. We discussed the risks of the procedure including
infection, bleeding, tissue injury and further surgery. We discussed
the low dose of radioactivity involved in the procedure. Informed,
written consent was given.

The usual time-out protocol was performed immediately prior to the
procedure.

Using mammographic guidance, sterile technique with chlorhexidine
skin antisepsis, 1% lidocaine the skin antisepsis, an Y-9TL
radioactive seed was used to localize the mass in the UPPER OUTER
QUADRANT of the RIGHT breast using a lateral approach.

The follow-up mammogram images confirm that the seed is in the
expected location immediately adjacent to the mass. The seed is
approximately 1.7 cm superior to the X clip. The images are marked
for Dr. Tadeusz.

Follow-up survey of the patient confirms the presence of the
radioactive seed.

Order number of Y-9TL seed [id number]

Total activity: 0.241 mCi

Reference Date: 10/07/2021

The patient tolerated the procedure well and was released from the
[REDACTED]. She was given instructions regarding seed removal.
IMPRESSION: Radioactive seed localization of a biopsy-proven papilloma involving
the UPPER OUTER QUADRANT of the RIGHT breast. No apparent
complications.

Please note that the X shaped tissue marking clip migrated away from
the biopsy-proven papilloma at the time of biopsy. Therefore, the
mass was localized. The X clip may not be removed at the time of
excisional biopsy.

## 2023-10-28 IMAGING — MG MM BREAST SURGICAL SPECIMEN
1 series · 2 of 2 positions shown · non-contrast
Comparison: Previous exam(s).

CLINICAL DATA: Post lumpectomy specimen radiograph.

EXAM:
SPECIMEN RADIOGRAPH OF THE RIGHT BREAST

[Series 1: R · right · 0.07mm/px · 2 of 2 slices shown]
[im 1/2]
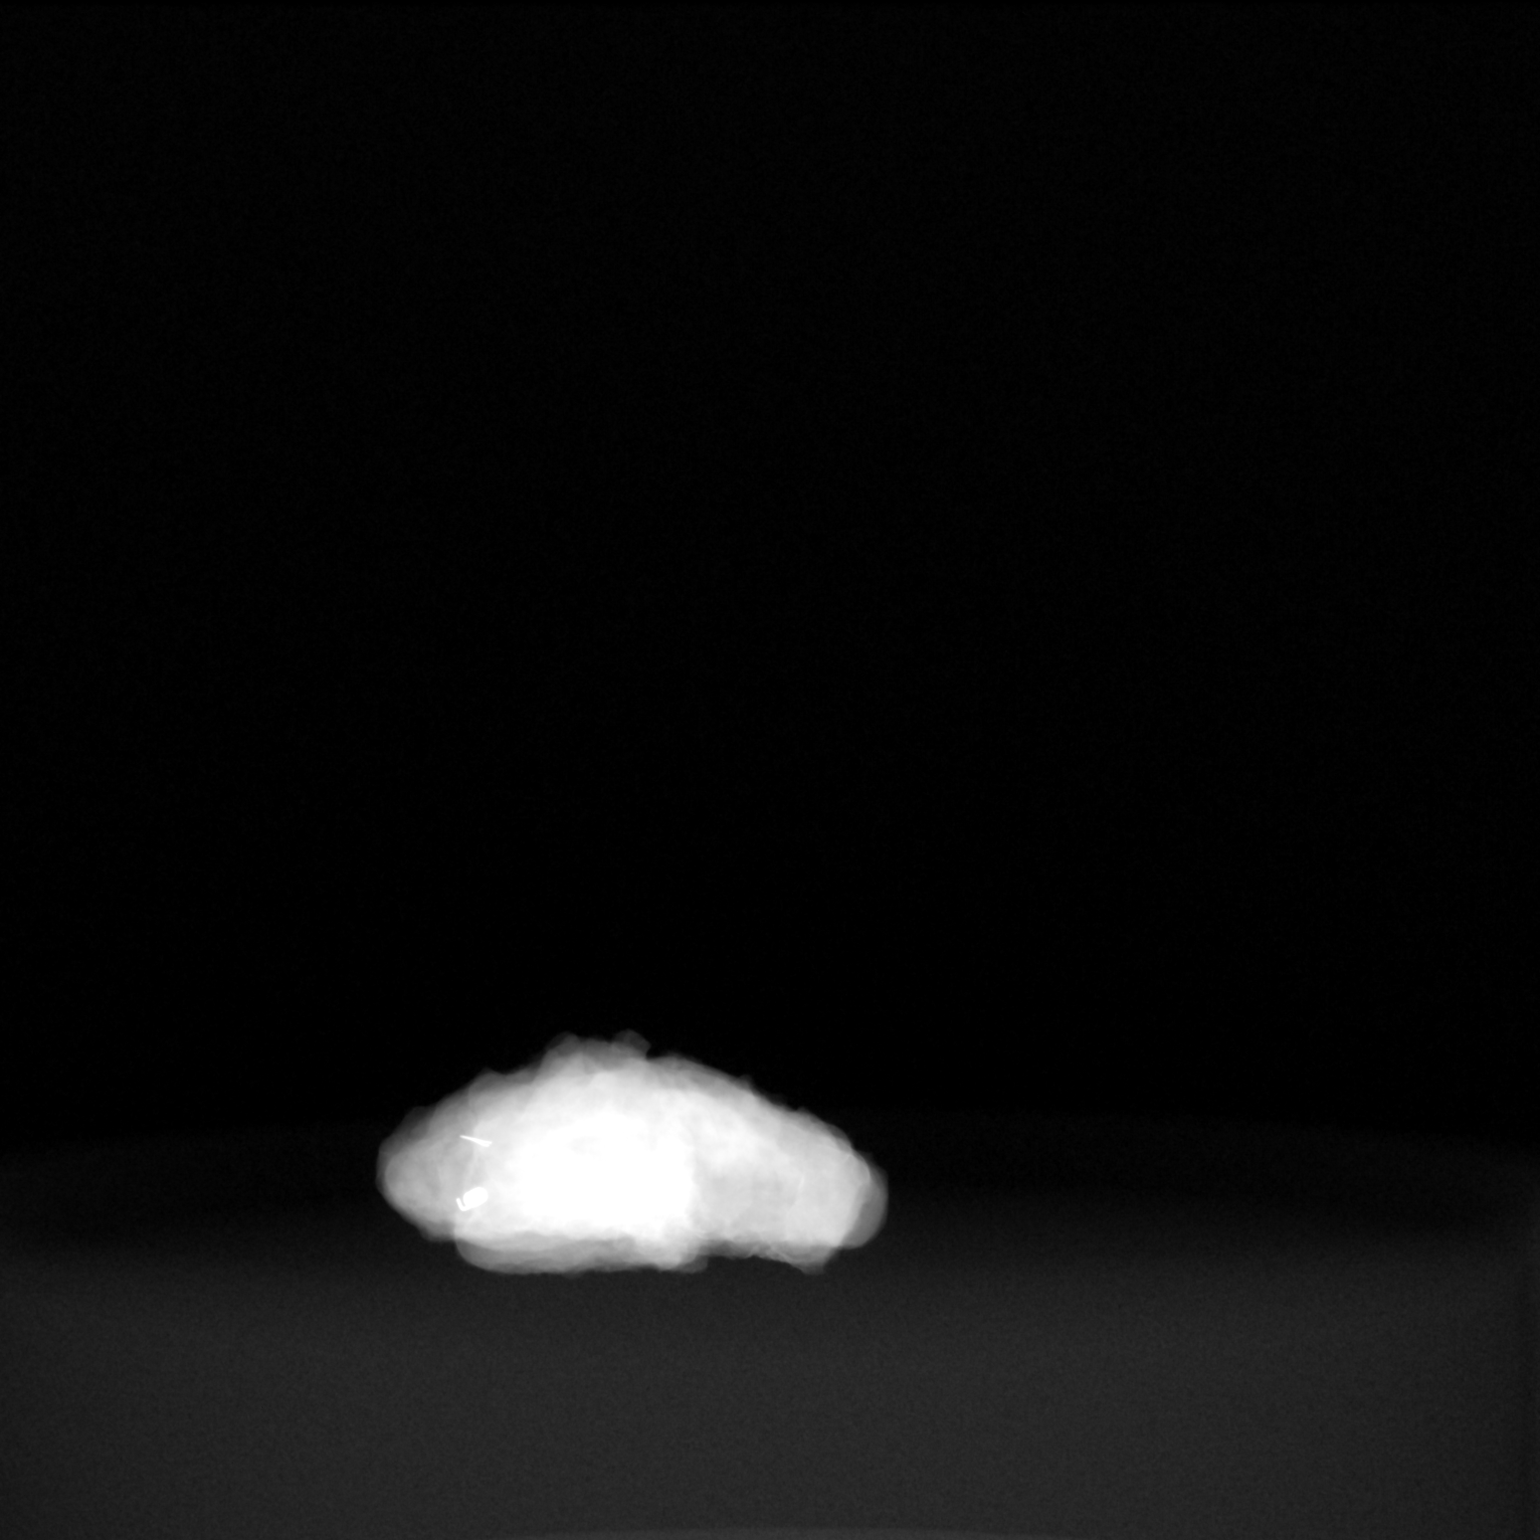
[im 2/2]
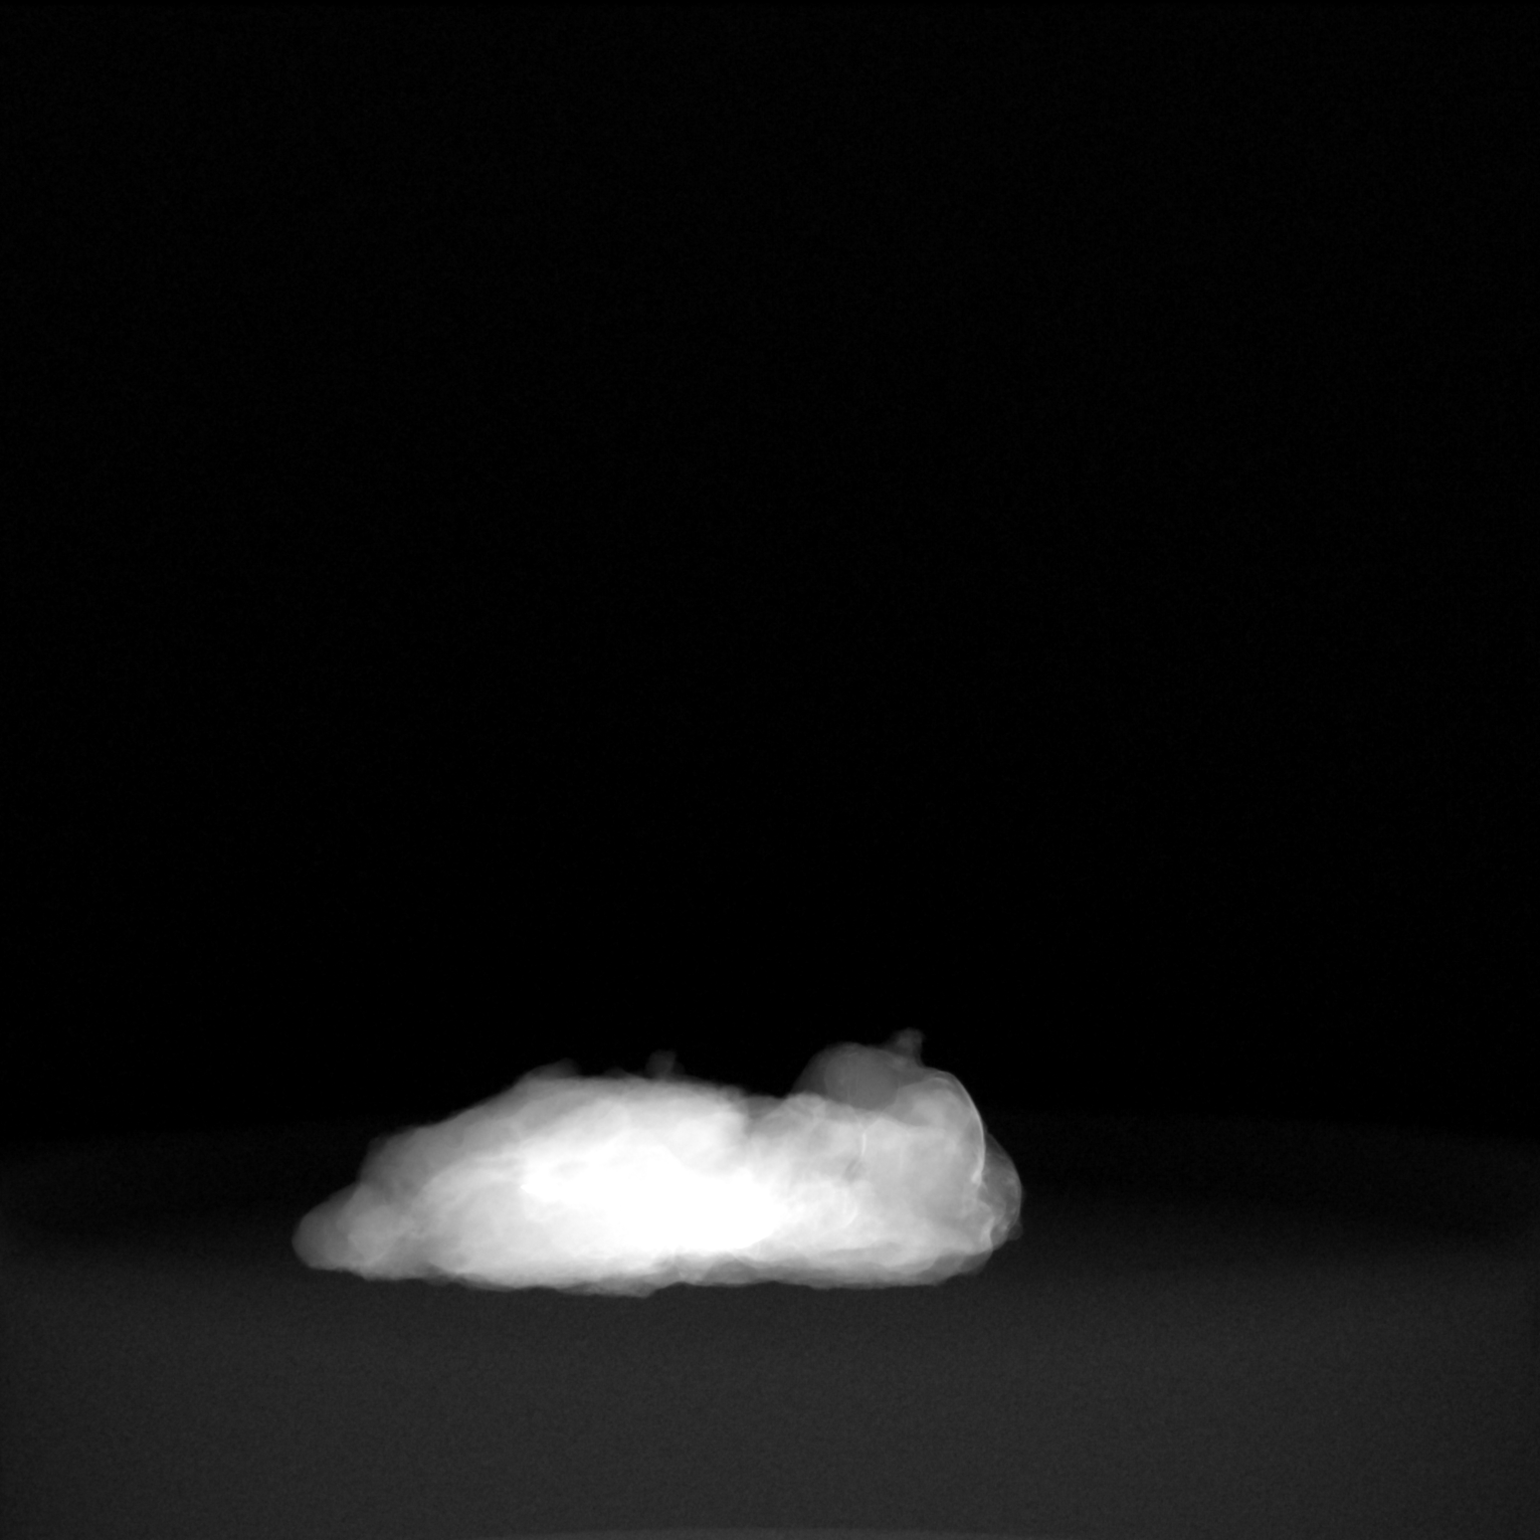

[2 of 2 positions shown; findings below may reference images not displayed]

FINDINGS: Status post excision of the right breast. The radioactive seed, X
biopsy marker clip and coil biopsy marker clip are present and
completely intact.
IMPRESSION: Specimen radiograph of the right breast.

These results were called by telephone at the time of interpretation
on 11/18/2021 at [DATE] to provider SURAJ DECASTRO , who verbally
acknowledged these results.

## 2023-11-06 ENCOUNTER — Other Ambulatory Visit: Payer: Self-pay | Admitting: Psychiatry

## 2023-11-06 DIAGNOSIS — F39 Unspecified mood [affective] disorder: Secondary | ICD-10-CM

## 2023-11-15 NOTE — Progress Notes (Signed)
HPI: [name] is a 42 y.o. female with a PMHx significant for Asthma, GERD, HLD, and generalized anxiety disorder, among others, who is here today for her routine physical.  Last CPE: 11/09/2022 She has followed with gynecology, gastroenterology, psychiatry, and dermatology since her last visit.   Exercise Patient states she is walking once per week. She says her depressive symptoms make it hard for her to motivate herself to exercise.  Diet: She says her appetite has been decreased for most of the year. She is avoiding soft drinks and sugar. She is eating vegetables daily.  Sleep: She reports she is sleeping 8-9 hours per night, and napping occasionally. Vision: She hasn't seen an eye doctor recently.  Dental: UTD on routine dental care.   Immunization History  Administered Date(s) Administered   Influenza Inj Mdck Quad Pf 08/23/2019, 08/14/2022   Influenza, Seasonal, Injecte, Preservative Fre 08/20/2015   Influenza,inj,Quad PF,6+ Mos 07/29/2018, 08/11/2020, 08/13/2021   Influenza,inj,quad, With Preservative 08/30/2016   Influenza-Unspecified 08/31/2017, 07/31/2020, 08/13/2021   PPD Test 11/25/2016, 11/29/2017, 11/14/2018, 10/29/2019, 10/28/2020, 11/08/2021   Pfizer(Comirnaty)Fall Seasonal Vaccine 12 years and older 09/04/2022   Tdap 11/25/2016   Unspecified SARS-COV-2 Vaccination 09/03/2020, 08/18/2021   Health Maintenance  Topic Date Due   Cervical Cancer Screening (HPV/Pap Cotest)  10/13/2017   INFLUENZA VACCINE  07/13/2023   COVID-19 Vaccine (4 - 2023-24 season) 08/13/2023   DTaP/Tdap/Td (2 - Td or Tdap) 11/25/2026   Hepatitis C Screening  Completed   HPV VACCINES  Aged Out   HIV Screening  Discontinued   Chronic medical problems:   Anxiety/Depression:   She was prescribed Auvelity by her psychiatrist in 02/2023, which significantly helped for a time but eventually became less effective and she had to come off of it.  She says her depression is as bad as it  has been since it began 8 years ago.  She is seeing her psychiatrist next week.  She asks about having her thyroid checked to see if that could be related to the problem.   Hyperlipidemia: Not currently on pharmacologic treatment.  Lab Results  Component Value Date   CHOL 192 11/09/2022   HDL 40.90 11/09/2022   LDLCALC 122 (H) 11/09/2022   TRIG 143.0 11/09/2022   CHOLHDL 5 11/09/2022   Diarrhea:  She saw gastroenterology in 11/2022, who recommended she increase the dose of her amitriptyline and try some fiber gummies.  She says her diarrhea has improved, which she believes is related to her diet changes.   Asthma:  Currently on levalbuterol 45 mcg/act every 6 hours prn. She hasn't tried symbicort before.   Review of Systems  Current Outpatient Medications on File Prior to Visit  Medication Sig Dispense Refill   amitriptyline (ELAVIL) 50 MG tablet Take 1 tablet (50 mg total) by mouth at bedtime. 90 tablet 2   buPROPion (WELLBUTRIN XL) 150 MG 24 hr tablet TAKE 1 TABLET BY MOUTH EVERY DAY 90 tablet 1   cetirizine (ZYRTEC) 10 MG tablet Take 10 mg by mouth daily as needed.  (Patient not taking: Reported on 05/26/2023)     clobetasol ointment (TEMOVATE) 0.05 % Apply 1 Application topically daily as needed. 30 g 1   clonazePAM (KLONOPIN) 0.5 MG tablet Take 1 tablet (0.5 mg total) by mouth 2 (two) times daily as needed for anxiety. 60 tablet 5   esomeprazole (NEXIUM) 40 MG capsule Take 1 capsule (40 mg total) by mouth daily. 90 capsule 3   fluticasone (FLONASE) 50 MCG/ACT nasal  spray Place into both nostrils daily as needed for allergies or rhinitis. (Patient not taking: Reported on 05/26/2023)     levalbuterol (XOPENEX HFA) 45 MCG/ACT inhaler Inhale 2 puffs into the lungs every 6 (six) hours as needed for wheezing or shortness of breath. 1 each 0   pramipexole (MIRAPEX) 0.5 MG tablet Take 1/2 tablet twice daily for one week, then increase to 1 tablet 60 tablet 1   No current  facility-administered medications on file prior to visit.    Past Medical History:  Diagnosis Date   Anxiety    Asthma    Constipation    Depression    Generalized headaches    GERD (gastroesophageal reflux disease)    IBS (irritable bowel syndrome)    Other and unspecified ovarian cysts    PONV (postoperative nausea and vomiting)    Pulse irregularity    Rapid heart rate    Seasonal allergies     Past Surgical History:  Procedure Laterality Date   BREAST LUMPECTOMY WITH RADIOACTIVE SEED LOCALIZATION Right 11/18/2021   Procedure: RIGHT BREAST LUMPECTOMY WITH RADIOACTIVE SEED LOCALIZATION;  Surgeon: Manus Rudd, MD;  Location:  [facility];  Service: General;  Laterality: Right;   DILATION AND CURETTAGE OF UTERUS  2005    Allergies  Allergen Reactions   Doxycycline Nausea And Vomiting    Other reaction(s): Vomiting    Family History  Problem Relation Age of Onset   Hypertension Mother    Hypertension Father    Anxiety disorder Father    Anxiety disorder Brother    Depression Brother    ADD / ADHD Son    Tourette syndrome Son    Depression Daughter    CAD Neg Hx     Social History   Socioeconomic History   Marital status: Married    Spouse name: Not on file   Number of children: 2   Years of education: Not on file   Highest education level: Associate degree: occupational, Scientist, product/process development, or vocational program  Occupational History   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  Tobacco Use   Smoking status: Never   Smokeless tobacco: Never  Vaping Use   Vaping status: Never Used  Substance and Sexual Activity   Alcohol use: Yes    Alcohol/week: 1.0 standard drink of alcohol    Types: 1 Standard drinks or equivalent per week    Comment: occasionally   Drug use: No   Sexual activity: Not on file  Other Topics Concern   Not on file  Social History Narrative   Not on file   Social Determinants of Health   Financial Resource Strain: Low Risk  (07/01/2022)    Overall Financial Resource Strain (CARDIA)    Difficulty of Paying Living Expenses: Not hard at all  Food Insecurity: No Food Insecurity (07/01/2022)   Hunger Vital Sign    Worried About Running Out of Food in the Last Year: Never true    Ran Out of Food in the Last Year: Never true  Transportation Needs: No Transportation Needs (07/01/2022)   PRAPARE - Administrator, Civil Service (Medical): No    Lack of Transportation (Non-Medical): No  Physical Activity: Insufficiently Active (07/01/2022)   Exercise Vital Sign    Days of Exercise per Week: 2 days    Minutes of Exercise per Session: 20 min  Stress: Stress Concern Present (07/01/2022)   [employer] of Occupational Health - Occupational Stress Questionnaire    Feeling of Stress :  Rather much  Social Connections: Moderately Isolated (07/01/2022)   Social Connection and Isolation Panel [NHANES]    Frequency of Communication with Friends and Family: More than three times a week    Frequency of Social Gatherings with Friends and Family: Twice a week    Attends Religious Services: Never    Database administrator or Organizations: No    Attends Engineer, structural: Not on file    Marital Status: Married    There were no vitals filed for this visit. There is no height or weight on file to calculate BMI.  Wt Readings from Last 3 Encounters:  11/18/22 183 lb 8 oz (83.2 kg)  11/09/22 183 lb 4 oz (83.1 kg)  07/04/22 181 lb 8 oz (82.3 kg)    Physical Exam Vitals and nursing note reviewed.  Constitutional:      General: She is not in acute distress.    Appearance: She is well-developed.  HENT:     Head: Normocephalic and atraumatic.     Right Ear: Hearing, tympanic membrane, ear canal and external ear normal.     Left Ear: Hearing, tympanic membrane, ear canal and external ear normal.     Mouth/Throat:     Mouth: Mucous membranes are moist.     Pharynx: Oropharynx is clear. Uvula midline.  Eyes:      Extraocular Movements: Extraocular movements intact.     Conjunctiva/sclera: Conjunctivae normal.     Pupils: Pupils are equal, round, and reactive to light.  Neck:     Thyroid: No thyromegaly.     Trachea: No tracheal deviation.  Cardiovascular:     Rate and Rhythm: Normal rate and regular rhythm.     Pulses:          Dorsalis pedis pulses are 2+ on the right side and 2+ on the left side.       Posterior tibial pulses are 2+ on the right side and 2+ on the left side.     Heart sounds: No murmur heard. Pulmonary:     Effort: Pulmonary effort is normal. No respiratory distress.     Breath sounds: Normal breath sounds.  Abdominal:     Palpations: Abdomen is soft. There is no hepatomegaly or mass.     Tenderness: There is no abdominal tenderness.  Genitourinary:    Comments: Deferred to gyn. Musculoskeletal:     Right lower leg: No edema.     Left lower leg: No edema.     Comments: No major deformity or signs of synovitis appreciated.  Lymphadenopathy:     Cervical: No cervical adenopathy.     Upper Body:     Right upper body: No supraclavicular adenopathy.     Left upper body: No supraclavicular adenopathy.  Skin:    General: Skin is warm.     Findings: No erythema or rash.  Neurological:     General: No focal deficit present.     Mental Status: She is alert and oriented to person, place, and time.     Cranial Nerves: No cranial nerve deficit.     Motor: No weakness.     Coordination: Coordination normal.     Gait: Gait normal.     Deep Tendon Reflexes:     Reflex Scores:      Bicep reflexes are 2+ on the right side and 2+ on the left side.      Patellar reflexes are 2+ on the right side and 2+ on the  left side. Psychiatric:     Comments: Well groomed, good eye contact.     ASSESSMENT AND PLAN:  [name] was here today for her annual physical examination.  No orders of the defined types were placed in this encounter.   @ASSESSPLAN @  There are no  diagnoses linked to this encounter.  No follow-ups on file.  I, Suanne Marker, acting as a scribe for Yu Cragun [place], MD., have documented all relevant documentation on the behalf of Danielle Mcgreal [place], MD, as directed by  Shelton Soler [place], MD while in the presence of Estera Ozier [place], MD.   I, Suanne Marker, have reviewed all documentation for this visit. The documentation on 11/15/23 for the exam, diagnosis, procedures, and orders are all accurate and complete.  Chance Munter G. [place], MD  [facility] [place] office.

## 2023-11-17 ENCOUNTER — Encounter: Payer: Self-pay | Admitting: Family Medicine

## 2023-11-17 ENCOUNTER — Ambulatory Visit: Payer: BC Managed Care – PPO | Admitting: Family Medicine

## 2023-11-17 VITALS — BP 120/70 | HR 100 | Temp 98.9°F | Resp 12 | Ht 61.0 in | Wt 163.0 lb

## 2023-11-17 DIAGNOSIS — Z1329 Encounter for screening for other suspected endocrine disorder: Secondary | ICD-10-CM | POA: Diagnosis not present

## 2023-11-17 DIAGNOSIS — K219 Gastro-esophageal reflux disease without esophagitis: Secondary | ICD-10-CM

## 2023-11-17 DIAGNOSIS — E785 Hyperlipidemia, unspecified: Secondary | ICD-10-CM | POA: Diagnosis not present

## 2023-11-17 DIAGNOSIS — Z0000 Encounter for general adult medical examination without abnormal findings: Secondary | ICD-10-CM | POA: Diagnosis not present

## 2023-11-17 DIAGNOSIS — Z130 Encounter for screening for diseases of the blood and blood-forming organs and certain disorders involving the immune mechanism: Secondary | ICD-10-CM

## 2023-11-17 DIAGNOSIS — Z13228 Encounter for screening for other metabolic disorders: Secondary | ICD-10-CM | POA: Diagnosis not present

## 2023-11-17 DIAGNOSIS — J4520 Mild intermittent asthma, uncomplicated: Secondary | ICD-10-CM

## 2023-11-17 DIAGNOSIS — F39 Unspecified mood [affective] disorder: Secondary | ICD-10-CM | POA: Diagnosis not present

## 2023-11-17 DIAGNOSIS — L301 Dyshidrosis [pompholyx]: Secondary | ICD-10-CM

## 2023-11-17 DIAGNOSIS — F332 Major depressive disorder, recurrent severe without psychotic features: Secondary | ICD-10-CM

## 2023-11-17 LAB — COMPREHENSIVE METABOLIC PANEL
ALT: 11 U/L (ref 0–35)
AST: 15 U/L (ref 0–37)
Albumin: 4.2 g/dL (ref 3.5–5.2)
Alkaline Phosphatase: 62 U/L (ref 39–117)
BUN: 13 mg/dL (ref 6–23)
CO2: 28 meq/L (ref 19–32)
Calcium: 9.6 mg/dL (ref 8.4–10.5)
Chloride: 103 meq/L (ref 96–112)
Creatinine, Ser: 0.73 mg/dL (ref 0.40–1.20)
GFR: 100.3 mL/min (ref >60.00)
Glucose, Bld: 91 mg/dL (ref 70–99)
Potassium: 4 meq/L (ref 3.5–5.1)
Sodium: 139 meq/L (ref 135–145)
Total Bilirubin: 0.6 mg/dL (ref 0.2–1.2)
Total Protein: 8 g/dL (ref 6.0–8.3)

## 2023-11-17 LAB — LIPID PANEL
Cholesterol: 217 mg/dL — ABNORMAL HIGH (ref 0–200)
HDL: 49.7 mg/dL (ref >39.00)
LDL Cholesterol: 142 mg/dL — ABNORMAL HIGH (ref 0–99)
NonHDL: 167.5
Total CHOL/HDL Ratio: 4
Triglycerides: 126 mg/dL (ref 0.0–149.0)
VLDL: 25.2 mg/dL (ref 0.0–40.0)

## 2023-11-17 LAB — TSH: TSH: 1.65 u[IU]/mL (ref 0.35–5.50)

## 2023-11-17 MED ORDER — ESOMEPRAZOLE MAGNESIUM 40 MG PO CPDR: 40.0000 mg | DELAYED_RELEASE_CAPSULE | Freq: Every day | ORAL | 3 refills | Status: DC

## 2023-11-17 MED ORDER — CLOBETASOL PROPIONATE 0.05 % EX OINT: 1.0000 | TOPICAL_OINTMENT | Freq: Every day | CUTANEOUS | 2 refills | Status: AC | PRN

## 2023-11-17 MED ORDER — LEVALBUTEROL TARTRATE 45 MCG/ACT IN AERO: 2.0000 | INHALATION_SPRAY | Freq: Four times a day (QID) | RESPIRATORY_TRACT | 0 refills | Status: DC | PRN

## 2023-11-17 NOTE — Assessment & Plan Note (Signed)
We discussed the importance of regular physical activity and healthy diet for prevention of chronic illness and/or complications. Preventive guidelines reviewed. Continue following with her gyn for female preventive care, which is reported as up to date. Vaccination up to date. Next CPE in a year.

## 2023-11-17 NOTE — Patient Instructions (Addendum)
A few things to remember from today's visit:  Routine general medical examination at a health care facility  Screening for endocrine, metabolic and immunity disorder - Plan: Comprehensive metabolic panel  Hyperlipidemia, unspecified hyperlipidemia type - Plan: Comprehensive metabolic panel, Lipid panel, TSH  Dyshidrotic eczema - Plan: clobetasol ointment (TEMOVATE) 0.05 %  Gastroesophageal reflux disease, unspecified whether esophagitis present - Plan: esomeprazole (NEXIUM) 40 MG capsule  Severe episode of recurrent major depressive disorder, without psychotic features (HCC) - Plan: TSH  If you need refills for medications you take chronically, please call your pharmacy. Do not use My Chart to request refills or for acute issues that need immediate attention. If you send a my chart message, it may take a few days to be addressed, specially if I am not in the office.  Please be sure medication list is accurate. If a new problem present, please set up appointment sooner than planned today.  Health Maintenance, Female Adopting a healthy lifestyle and getting preventive care are important in promoting health and wellness. Ask your health care provider about: The right schedule for you to have regular tests and exams. Things you can do on your own to prevent diseases and keep yourself healthy. What should I know about diet, weight, and exercise? Eat a healthy diet  Eat a diet that includes plenty of vegetables, fruits, low-fat dairy products, and lean protein. Do not eat a lot of foods that are high in solid fats, added sugars, or sodium. Maintain a healthy weight Body mass index (BMI) is used to identify weight problems. It estimates body fat based on height and weight. Your health care provider can help determine your BMI and help you achieve or maintain a healthy weight. Get regular exercise Get regular exercise. This is one of the most important things you can do for your health. Most  adults should: Exercise for at least 150 minutes each week. The exercise should increase your heart rate and make you sweat (moderate-intensity exercise). Do strengthening exercises at least twice a week. This is in addition to the moderate-intensity exercise. Spend less time sitting. Even light physical activity can be beneficial. Watch cholesterol and blood lipids Have your blood tested for lipids and cholesterol at 44 years of age, then have this test every 5 years. Have your cholesterol levels checked more often if: Your lipid or cholesterol levels are high. You are older than 44 years of age. You are at high risk for heart disease. What should I know about cancer screening? Depending on your health history and family history, you may need to have cancer screening at various ages. This may include screening for: Breast cancer. Cervical cancer. Colorectal cancer. Skin cancer. Lung cancer. What should I know about heart disease, diabetes, and high blood pressure? Blood pressure and heart disease High blood pressure causes heart disease and increases the risk of stroke. This is more likely to develop in people who have high blood pressure readings or are overweight. Have your blood pressure checked: Every 3-5 years if you are 68-92 years of age. Every year if you are 77 years old or older. Diabetes Have regular diabetes screenings. This checks your fasting blood sugar level. Have the screening done: Once every three years after age 49 if you are at a normal weight and have a low risk for diabetes. More often and at a younger age if you are overweight or have a high risk for diabetes. What should I know about preventing infection? Hepatitis B If  you have a higher risk for hepatitis B, you should be screened for this virus. Talk with your health care provider to find out if you are at risk for hepatitis B infection. Hepatitis C Testing is recommended for: Everyone born from [birth year]  through 1965. Anyone with known risk factors for hepatitis C. Sexually transmitted infections (STIs) Get screened for STIs, including gonorrhea and chlamydia, if: You are sexually active and are younger than 44 years of age. You are older than 44 years of age and your health care provider tells you that you are at risk for this type of infection. Your sexual activity has changed since you were last screened, and you are at increased risk for chlamydia or gonorrhea. Ask your health care provider if you are at risk. Ask your health care provider about whether you are at high risk for HIV. Your health care provider may recommend a prescription medicine to help prevent HIV infection. If you choose to take medicine to prevent HIV, you should first get tested for HIV. You should then be tested every 3 months for as long as you are taking the medicine. Pregnancy If you are about to stop having your period (premenopausal) and you may become pregnant, seek counseling before you get pregnant. Take 400 to 800 micrograms (mcg) of folic acid every day if you become pregnant. Ask for birth control (contraception) if you want to prevent pregnancy. Osteoporosis and menopause Osteoporosis is a disease in which the bones lose minerals and strength with aging. This can result in bone fractures. If you are 62 years old or older, or if you are at risk for osteoporosis and fractures, ask your health care provider if you should: Be screened for bone loss. Take a calcium or vitamin D supplement to lower your risk of fractures. Be given hormone replacement therapy (HRT) to treat symptoms of menopause. Follow these instructions at home: Alcohol use Do not drink alcohol if: Your health care provider tells you not to drink. You are pregnant, may be pregnant, or are planning to become pregnant. If you drink alcohol: Limit how much you have to: 0-1 drink a day. Know how much alcohol is in your drink. In the [place] one  drink equals one 12 oz bottle of beer (355 mL), one 5 oz glass of wine (148 mL), or one 1 oz glass of hard liquor (44 mL). Lifestyle Do not use any products that contain nicotine or tobacco. These products include cigarettes, chewing tobacco, and vaping devices, such as e-cigarettes. If you need help quitting, ask your health care provider. Do not use street drugs. Do not share needles. Ask your health care provider for help if you need support or information about quitting drugs. General instructions Schedule regular health, dental, and eye exams. Stay current with your vaccines. Tell your health care provider if: You often feel depressed. You have ever been abused or do not feel safe at home. Summary Adopting a healthy lifestyle and getting preventive care are important in promoting health and wellness. Follow your health care provider's instructions about healthy diet, exercising, and getting tested or screened for diseases. Follow your health care provider's instructions on monitoring your cholesterol and blood pressure. This information is not intended to replace advice given to you by your health care provider. Make sure you discuss any questions you have with your health care provider. Document Revised: 04/19/2021 Document Reviewed: 04/19/2021 Elsevier Patient Education  2024 [employer].

## 2023-11-18 DIAGNOSIS — F332 Major depressive disorder, recurrent severe without psychotic features: Secondary | ICD-10-CM | POA: Insufficient documentation

## 2023-11-18 NOTE — Assessment & Plan Note (Signed)
Asymptomatic. Continue Nexium 40 mg daily and GERD precautions.

## 2023-11-18 NOTE — Assessment & Plan Note (Signed)
Problem is not well controlled. Following with psychiatrist.

## 2023-11-18 NOTE — Assessment & Plan Note (Signed)
Stable. She does not think treatment needs to be changed to a LABA/ICS,  levalbuterol 45 mcg/act every 6 hours prn helps. F/U in a year.

## 2023-11-18 NOTE — Assessment & Plan Note (Signed)
Problem is stable. Continue clobetasol cream, small amount on affected areas daily as needed.

## 2023-11-24 ENCOUNTER — Ambulatory Visit: Payer: BC Managed Care – PPO | Admitting: Psychiatry

## 2023-11-24 ENCOUNTER — Encounter: Payer: Self-pay | Admitting: Psychiatry

## 2023-11-24 DIAGNOSIS — F99 Mental disorder, not otherwise specified: Secondary | ICD-10-CM

## 2023-11-24 DIAGNOSIS — F5105 Insomnia due to other mental disorder: Secondary | ICD-10-CM

## 2023-11-24 DIAGNOSIS — F39 Unspecified mood [affective] disorder: Secondary | ICD-10-CM

## 2023-11-24 DIAGNOSIS — F410 Panic disorder [episodic paroxysmal anxiety] without agoraphobia: Secondary | ICD-10-CM

## 2023-11-24 DIAGNOSIS — F411 Generalized anxiety disorder: Secondary | ICD-10-CM | POA: Diagnosis not present

## 2023-11-24 MED ORDER — AMITRIPTYLINE HCL 50 MG PO TABS: 50.0000 mg | ORAL_TABLET | Freq: Every day | ORAL | 3 refills | Status: AC

## 2023-11-24 MED ORDER — AUVELITY 45-105 MG PO TBCR: 1.0000 | EXTENDED_RELEASE_TABLET | Freq: Two times a day (BID) | ORAL | 2 refills | Status: DC

## 2023-11-24 MED ORDER — LUMATEPERONE TOSYLATE 10.5 MG PO CAPS: 10.5000 mg | ORAL_CAPSULE | Freq: Every day | ORAL | 1 refills | Status: DC

## 2023-11-24 MED ORDER — CLONAZEPAM 0.5 MG PO TABS: 0.5000 mg | ORAL_TABLET | Freq: Two times a day (BID) | ORAL | 5 refills | Status: AC | PRN

## 2023-11-24 NOTE — Progress Notes (Signed)
Danielle [name] [id number] Aug 01, 1981 42 y.o.  Subjective:   Patient ID:  Danielle [name] is a 42 y.o. (DOB 12-Mar-1981) female.  Chief Complaint:  Chief Complaint  Patient presents with   Depression   Anxiety    HPI Danielle [name] presents to the office today for follow-up of depression, anxiety, and sleep disturbance.  She reports that she is experiencing the worse depressive episode she has had since 2017-2018. She reports that she has had significant depression for the last 1.5 months. She reports 7-10 days ago she decided to re-try Auvelity once daily. She reports that she has not had as dramatic response as she did initially, however she reports that she is having less crying episodes. She reports that she continues to feel like she is "going through the motions." She reports low interest, even for things that she typically enjoys and looks forward to (travel, reading, etc). She reports that she has no interest in celebrating Christmas, which is not typical for her. She reports that yesterday was the first time she walked in a month. Energy and motivation have been very low.  "I feel not quite as bad as I did 2 weeks ago, but it's still bad." She reports irritability, particularly at the end of the day. Sleeping more and going to bed earlier. Will sometimes nap during the day. Occasional difficulty falling asleep. Notices less appetite with Auvelity- "I'm not eating to eat." Lost 21 lbs in the last year. She denies any change in anxiety. 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 She reports that she worries about how long depression will last, if it will improve, etc. She continues to have anxiety with driving and stuck in traffic. Avoids driving in a certain area and has had panic attacks when stuck in traffic. She reports that she notices "brain fog... not on top of things as much  as I normally am." She reports feeling less organized. Denies SI.   Son is 9 yo. Daughter is 64 yo and at [employer]. Daughter lives at home.    Past Psychiatric Medication Trials: Abilify- Tremor Latuda Vraylar- Irritability, Agitation Rexulti- Palpitations Lamictal Viibryd- panic Trintellix- HA's, possible headaches. Prozac Effexor XR- Caused manic s/s.  Zoloft Lexapro Auvelity Wellbutrin- Headaches Buspar Xanax- excessive somnolence, ineffective Clorazepate- ineffective Klonopin Ativan Trileptal-ineffective Topamax- severe dry mouth Trazodone- Has had dizziness and nausea with doses above 100 mg.  Deplin Propranolol-Ineffective Clonidine- May have been helpful for panic s/s. Caused drowsiness.  Gabapentin- Noticed restlessness with 900 mg.  Amitriptyline- Helpful for headaches and mood.   AUDIT    Flowsheet Row Office Visit from 07/04/2022 in [facility] [place]  Alcohol Use Disorder Identification Test Final Score (AUDIT) 3      GAD-7    Flowsheet Row Office Visit from 11/17/2023 in [facility] [place]  Total GAD-7 Score 5      PHQ2-9    Flowsheet Row Office Visit from 11/17/2023 in [facility] [place] Office Visit from 11/09/2022 in [facility] [place] Office Visit from 07/04/2022 in [facility] [place] Office Visit from 11/08/2021 in [facility] [place] Office Visit from 01/10/2017 in [facility] at [facility] Total Score 4 2 0 2 0  PHQ-9 Total Score 10 3 2 7  --      Flowsheet Row  ED from 11/15/2022 in [facility] Admission (Discharged) from 11/18/2021 in [facility]  C-SSRS RISK CATEGORY No Risk No Risk        Review of Systems:  Review of Systems  Gastrointestinal:        Improved GI symptoms  Musculoskeletal:  Negative for gait problem.   Psychiatric/Behavioral:         Please refer to HPI    Medications: I have reviewed the patient's current medications.  Current Outpatient Medications  Medication Sig Dispense Refill   esomeprazole (NEXIUM) 40 MG capsule Take 1 capsule (40 mg total) by mouth daily. 90 capsule 3   levalbuterol (XOPENEX HFA) 45 MCG/ACT inhaler Inhale 2 puffs into the lungs every 6 (six) hours as needed for wheezing or shortness of breath. 1 each 0   lumateperone tosylate (CAPLYTA) 10.5 MG capsule Take 1 capsule (10.5 mg total) by mouth daily. 30 capsule 1   amitriptyline (ELAVIL) 50 MG tablet Take 1 tablet (50 mg total) by mouth at bedtime. 90 tablet 3   cetirizine (ZYRTEC) 10 MG tablet Take 10 mg by mouth daily as needed. (Patient not taking: Reported on 11/24/2023)     clobetasol ointment (TEMOVATE) 0.05 % Apply 1 Application topically daily as needed. 30 g 2   [START ON 12/05/2023] clonazePAM (KLONOPIN) 0.5 MG tablet Take 1 tablet (0.5 mg total) by mouth 2 (two) times daily as needed for anxiety. 60 tablet 5   Dextromethorphan-buPROPion ER (AUVELITY) 45-105 MG TBCR Take 1 tablet by mouth 2 (two) times daily. 60 tablet 2   fluticasone (FLONASE) 50 MCG/ACT nasal spray Place into both nostrils daily as needed for allergies or rhinitis. (Patient not taking: Reported on 11/24/2023)     No current facility-administered medications for this visit.    Medication Side Effects: Other: Dry mouth, mild restlessness  Allergies:  Allergies  Allergen Reactions   Doxycycline Nausea And Vomiting    Other reaction(s): Vomiting    Past Medical History:  Diagnosis Date   Anxiety    Asthma    Constipation    Depression    Generalized headaches    GERD (gastroesophageal reflux disease)    IBS (irritable bowel syndrome)    Other and unspecified ovarian cysts    PONV (postoperative nausea and vomiting)    Pulse irregularity    Rapid heart rate    Seasonal allergies     Past Medical History, Surgical history,  Social history, and Family history were reviewed and updated as appropriate.   Please see review of systems for further details on the patient's review from today.   Objective:   Physical Exam:  LMP 11/15/2023   Physical Exam Constitutional:      General: She is not in acute distress. Musculoskeletal:        General: No deformity.  Neurological:     Mental Status: She is alert and oriented to person, place, and time.     Coordination: Coordination normal.  Psychiatric:        Attention and Perception: Attention and perception normal. She does not perceive auditory or visual hallucinations.        Mood and Affect: Mood is anxious and depressed. Affect is not labile, blunt, angry or inappropriate.        Speech: Speech normal.        Behavior: Behavior normal.        Thought Content: Thought content normal. Thought content is not paranoid or delusional. Thought content does  not include homicidal or suicidal ideation. Thought content does not include homicidal or suicidal plan.        Cognition and Memory: Cognition and memory normal.        Judgment: Judgment normal.     Comments: Insight intact     Lab Review:     Component Value Date/Time   NA 139 11/17/2023 0906   K 4.0 11/17/2023 0906   CL 103 11/17/2023 0906   CO2 28 11/17/2023 0906   GLUCOSE 91 11/17/2023 0906   BUN 13 11/17/2023 0906   CREATININE 0.73 11/17/2023 0906   CREATININE 0.69 10/28/2020 1118   CALCIUM 9.6 11/17/2023 0906   PROT 8.0 11/17/2023 0906   ALBUMIN 4.2 11/17/2023 0906   AST 15 11/17/2023 0906   ALT 11 11/17/2023 0906   ALKPHOS 62 11/17/2023 0906   BILITOT 0.6 11/17/2023 0906   GFRNONAA >60 11/15/2022 0021   GFRNONAA 109 10/28/2020 1118   GFRAA 126 10/28/2020 1118       Component Value Date/Time   WBC 12.5 (H) 11/15/2022 0021   RBC 4.52 11/15/2022 0021   HGB 13.9 11/15/2022 0021   HCT 38.6 11/15/2022 0021   PLT 316 11/15/2022 0021   MCV 85.4 11/15/2022 0021   MCH 30.8 11/15/2022 0021    MCHC 36.0 11/15/2022 0021   RDW 12.0 11/15/2022 0021    No results found for: "POCLITH", "LITHIUM"   No results found for: "PHENYTOIN", "PHENOBARB", "VALPROATE", "CBMZ"   .res Assessment: Plan:    38 minutes spent dedicated to the care of this patient on the date of this encounter to include pre-visit review of records, ordering of medication, post visit documentation, and face-to-face time with the patient discussing recent worsening depressive symptoms and possible treatment options. Agree with continuing Auvelity since she reports some slight improvement in depressive symptoms since recently re-starting Auvelity.  Discussed potential benefits, risks, and side effects of Caplyta for Bipolar Depression. Pt agrees to trial of capylta. Will send script for Caplyta 10.5 mg daily to start with low dose since pt has history of adverse reactions with multiple medications. Discussed that dose may need to be titrated.  Discussed considering TMS as treatment option for treatment resistant depression since she has had limited improvement with multiple medications and has had adverse effects with various psychiatric medications. Provided pt with contact information for [facility].  Continue Amitriptyline 50 mg po at bedtime since this has been helpful for her GI issues and pt has had worsening anxiety with dose reduction attempts.  Continue Klonopin 0.5 mg po BID prn anxiety.  Pt to follow-up with this provider in 3-4 weeks or sooner if clinically indicated.  Patient advised to contact office with any questions, adverse effects, or acute worsening in signs and symptoms.    Danielle [name] was seen today for depression and anxiety.  Diagnoses and all orders for this visit:  Mood disorder (HCC) -     Dextromethorphan-buPROPion ER (AUVELITY) 45-105 MG TBCR; Take 1 tablet by mouth 2 (two) times daily. -     amitriptyline (ELAVIL) 50 MG tablet; Take 1 tablet (50 mg total) by mouth at bedtime. -      lumateperone tosylate (CAPLYTA) 10.5 MG capsule; Take 1 capsule (10.5 mg total) by mouth daily.  Generalized anxiety disorder -     amitriptyline (ELAVIL) 50 MG tablet; Take 1 tablet (50 mg total) by mouth at bedtime. -     clonazePAM (KLONOPIN) 0.5 MG tablet; Take 1 tablet (0.5 mg total) by  mouth 2 (two) times daily as needed for anxiety.  Insomnia due to other mental disorder -     amitriptyline (ELAVIL) 50 MG tablet; Take 1 tablet (50 mg total) by mouth at bedtime. -     clonazePAM (KLONOPIN) 0.5 MG tablet; Take 1 tablet (0.5 mg total) by mouth 2 (two) times daily as needed for anxiety.  Panic disorder -     clonazePAM (KLONOPIN) 0.5 MG tablet; Take 1 tablet (0.5 mg total) by mouth 2 (two) times daily as needed for anxiety.     Please see After Visit Summary for patient specific instructions.  Future Appointments  Date Time Provider Department Center  12/15/2023 12:45 PM Corie Chiquito, PMHNP [facility] None    No orders of the defined types were placed in this encounter.   -------------------------------

## 2023-11-24 NOTE — Patient Instructions (Signed)
May wish to consider [facility] as another treatment option. [phone].

## 2023-12-15 ENCOUNTER — Telehealth (INDEPENDENT_AMBULATORY_CARE_PROVIDER_SITE_OTHER): Payer: BC Managed Care – PPO | Admitting: Psychiatry

## 2023-12-15 ENCOUNTER — Encounter: Payer: Self-pay | Admitting: Psychiatry

## 2023-12-15 DIAGNOSIS — F411 Generalized anxiety disorder: Secondary | ICD-10-CM

## 2023-12-15 DIAGNOSIS — F39 Unspecified mood [affective] disorder: Secondary | ICD-10-CM | POA: Diagnosis not present

## 2023-12-15 DIAGNOSIS — F99 Mental disorder, not otherwise specified: Secondary | ICD-10-CM

## 2023-12-15 DIAGNOSIS — F5105 Insomnia due to other mental disorder: Secondary | ICD-10-CM

## 2023-12-15 DIAGNOSIS — F410 Panic disorder [episodic paroxysmal anxiety] without agoraphobia: Secondary | ICD-10-CM | POA: Diagnosis not present

## 2023-12-15 MED ORDER — AUVELITY 45-105 MG PO TBCR: 1.0000 | EXTENDED_RELEASE_TABLET | Freq: Two times a day (BID) | ORAL | 2 refills | Status: AC

## 2023-12-15 MED ORDER — CAPLYTA 21 MG PO CAPS: 21.0000 mg | ORAL_CAPSULE | Freq: Every day | ORAL | 2 refills | Status: AC

## 2023-12-15 NOTE — Progress Notes (Signed)
 Danielle [name] [id number] Feb 17, 1981 43 y.o.  Virtual Visit via Video Note  I connected with pt @ on 12/15/23 at 12:45 PM EST by a video enabled telemedicine application and verified that I am speaking with the correct person using two identifiers.   I discussed the limitations of evaluation and management by telemedicine and the availability of in person appointments. The patient expressed understanding and agreed to proceed.  I discussed the assessment and treatment plan with the patient. The patient was provided an opportunity to ask questions and all were answered. The patient agreed with the plan and demonstrated an understanding of the instructions.   The patient was advised to call back or seek an in-person evaluation if the symptoms worsen or if the condition fails to improve as anticipated.  I provided 17 minutes of non-face-to-face time during this encounter.  The patient was located at home.  The provider was located at home.   Harlene Pepper, PMHNP   Subjective:   Patient ID:  Danielle [name] is a 43 y.o. (DOB 1981-05-13) female.  Chief Complaint:  Chief Complaint  Patient presents with   Depression   Anxiety    HPI Danielle [name] presents for follow-up of depression, anxiety, and sleep disturbance. She reports, a little bit better but still not where I want to be. She reports her mood is lower than normal and not as good as where her mood in the spring and summer. She reports, not having as extreme of emotions as I was. She reports crying episodes are less. Irritability may be slightly improved. She reports that her anxiety remains elevated. Anxiety is unchanged. She reports that her energy goes and comes... I don't know how I will feel one day to the next. She reports fluctuations in her mood and energy are less significant. She reports difficulty falling asleep and feels like her brain is on and notices some rumination. She reports that she tends to  stay asleep once she falls asleep. She reports sleeping 5-6 hours a night some nights. Reports on a good night I get 8 hours of sleep. Appetite remains decreased. She reports feeling hungry less often. She will have cravings for specific foods. She reports that she never really got in the spirit of Christmas and did not enjoy the holiday as much. She reports that gathered with people 7 times over Christmas. Has noticed that she has initiated some fun activities in the last few days. Denies SI.   She reports that this may be the worst depression  She walked a few times this week for the first time in a couple of months.   Past Psychiatric Medication Trials: Abilify- Tremor Latuda  Vraylar- Irritability, Agitation Rexulti - Palpitations Lamictal Viibryd- panic Trintellix - HA's, possible headaches. Prozac  Effexor XR- Caused manic s/s.  Zoloft Lexapro  Auvelity  Wellbutrin - Headaches Buspar Xanax- excessive somnolence, ineffective Clorazepate- ineffective Klonopin  Ativan Trileptal-ineffective Topamax - severe dry mouth Trazodone - Has had dizziness and nausea with doses above 100 mg.  Deplin Propranolol-Ineffective Clonidine - May have been helpful for panic s/s. Caused drowsiness.  Gabapentin - Noticed restlessness with 900 mg.  Amitriptyline - Helpful for headaches and mood.   Review of Systems:  Review of Systems  Musculoskeletal:  Negative for gait problem.       She reports some slight muscle twitches  Neurological:        No change in headaches  Psychiatric/Behavioral:         Please refer to HPI    Medications: I have reviewed the  patient's current medications.  Current Outpatient Medications  Medication Sig Dispense Refill   amitriptyline  (ELAVIL ) 50 MG tablet Take 1 tablet (50 mg total) by mouth at bedtime. 90 tablet 3   clonazePAM  (KLONOPIN ) 0.5 MG tablet Take 1 tablet (0.5 mg total) by mouth 2 (two) times daily as needed for anxiety. 60 tablet 5   esomeprazole   (NEXIUM ) 40 MG capsule Take 1 capsule (40 mg total) by mouth daily. 90 capsule 3   levalbuterol  (XOPENEX  HFA) 45 MCG/ACT inhaler Inhale 2 puffs into the lungs every 6 (six) hours as needed for wheezing or shortness of breath. 1 each 0   Lumateperone  Tosylate (CAPLYTA ) 21 MG CAPS Take 21 mg by mouth daily. 30 capsule 2   cetirizine (ZYRTEC) 10 MG tablet Take 10 mg by mouth daily as needed. (Patient not taking: Reported on 11/24/2023)     clobetasol  ointment (TEMOVATE ) 0.05 % Apply 1 Application topically daily as needed. 30 g 2   Dextromethorphan-buPROPion  ER (AUVELITY ) 45-105 MG TBCR Take 1 tablet by mouth 2 (two) times daily. 60 tablet 2   fluticasone (FLONASE) 50 MCG/ACT nasal spray Place into both nostrils daily as needed for allergies or rhinitis. (Patient not taking: Reported on 11/24/2023)     No current facility-administered medications for this visit.    Medication Side Effects: Other: Dry mouth and muscle twitching. Denies pacing.   Allergies:  Allergies  Allergen Reactions   Doxycycline Nausea And Vomiting    Other reaction(s): Vomiting    Past Medical History:  Diagnosis Date   Anxiety    Asthma    Constipation    Depression    Generalized headaches    GERD (gastroesophageal reflux disease)    IBS (irritable bowel syndrome)    Other and unspecified ovarian cysts    PONV (postoperative nausea and vomiting)    Pulse irregularity    Rapid heart rate    Seasonal allergies     Family History  Problem Relation Age of Onset   Hypertension Mother    Hypertension Father    Anxiety disorder Father    Anxiety disorder Brother    Depression Brother    ADD / ADHD Son    Tourette syndrome Son    Depression Daughter    CAD Neg Hx     Social History   Socioeconomic History   Marital status: Married    Spouse name: Not on file   Number of children: 2   Years of education: Not on file   Highest education level: Associate degree: occupational, scientist, product/process development, or vocational  program  Occupational History   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  Tobacco Use   Smoking status: Never   Smokeless tobacco: Never  Vaping Use   Vaping status: Never Used  Substance and Sexual Activity   Alcohol use: Yes    Alcohol/week: 1.0 standard drink of alcohol    Types: 1 Standard drinks or equivalent per week    Comment: occasionally   Drug use: No   Sexual activity: Not on file  Other Topics Concern   Not on file  Social History Narrative   Not on file   Social Drivers of Health   Financial Resource Strain: Low Risk  (07/01/2022)   Overall Financial Resource Strain (CARDIA)    Difficulty of Paying Living Expenses: Not hard at all  Food Insecurity: No Food Insecurity (07/01/2022)   Hunger Vital Sign    Worried About Running Out of Food in the Last Year: Never true  Ran Out of Food in the Last Year: Never true  Transportation Needs: No Transportation Needs (07/01/2022)   PRAPARE - Administrator, Civil Service (Medical): No    Lack of Transportation (Non-Medical): No  Physical Activity: Insufficiently Active (07/01/2022)   Exercise Vital Sign    Days of Exercise per Week: 2 days    Minutes of Exercise per Session: 20 min  Stress: Stress Concern Present (07/01/2022)   [employer] of Occupational Health - Occupational Stress Questionnaire    Feeling of Stress : Rather much  Social Connections: Moderately Isolated (07/01/2022)   Social Connection and Isolation Panel [NHANES]    Frequency of Communication with Friends and Family: More than three times a week    Frequency of Social Gatherings with Friends and Family: Twice a week    Attends Religious Services: Never    Database Administrator or Organizations: No    Attends Engineer, Structural: Not on file    Marital Status: Married  Catering Manager Violence: Not on file    Past Medical History, Surgical history, Social history, and Family history were reviewed and updated as appropriate.    Please see review of systems for further details on the patient's review from today.   Objective:   Physical Exam:  LMP 11/15/2023   Physical Exam Neurological:     Mental Status: She is alert and oriented to person, place, and time.     Cranial Nerves: No dysarthria.  Psychiatric:        Attention and Perception: Attention and perception normal.        Mood and Affect: Mood is anxious and depressed.        Speech: Speech normal.        Behavior: Behavior is cooperative.        Thought Content: Thought content normal. Thought content is not paranoid or delusional. Thought content does not include homicidal or suicidal ideation. Thought content does not include homicidal or suicidal plan.        Cognition and Memory: Cognition and memory normal.        Judgment: Judgment normal.     Comments: Insight intact Mood presents as somewhat less depressed compared to last visit     Lab Review:     Component Value Date/Time   NA 139 11/17/2023 0906   K 4.0 11/17/2023 0906   CL 103 11/17/2023 0906   CO2 28 11/17/2023 0906   GLUCOSE 91 11/17/2023 0906   BUN 13 11/17/2023 0906   CREATININE 0.73 11/17/2023 0906   CREATININE 0.69 10/28/2020 1118   CALCIUM 9.6 11/17/2023 0906   PROT 8.0 11/17/2023 0906   ALBUMIN 4.2 11/17/2023 0906   AST 15 11/17/2023 0906   ALT 11 11/17/2023 0906   ALKPHOS 62 11/17/2023 0906   BILITOT 0.6 11/17/2023 0906   GFRNONAA >60 11/15/2022 0021   GFRNONAA 109 10/28/2020 1118   GFRAA 126 10/28/2020 1118       Component Value Date/Time   WBC 12.5 (H) 11/15/2022 0021   RBC 4.52 11/15/2022 0021   HGB 13.9 11/15/2022 0021   HCT 38.6 11/15/2022 0021   PLT 316 11/15/2022 0021   MCV 85.4 11/15/2022 0021   MCH 30.8 11/15/2022 0021   MCHC 36.0 11/15/2022 0021   RDW 12.0 11/15/2022 0021    No results found for: POCLITH, LITHIUM    No results found for: PHENYTOIN, PHENOBARB, VALPROATE, CBMZ   .res Assessment: Plan:   Will increase Caplyta   to 21 mg daily since pt reports that she is experiencing some possible slight improvement in depressive symptoms with 10.5 mg dose without any significant side effects.  Discussed that she may want to adjust administration time of Caplyta  from morning to later in the daytime to possibly minimize sleep disturbance.  Will continue Auvelity  45-105 mg one tablet twice daily for depression. Continue Amitriptyline  50 mg at bedtime for anxiety, depression, and headaches.  Continue klonopin  0.5 mg twice daily as needed for anxiety. Pt to follow-up with this provider in 4-8 weeks or sooner if clinically indicated.  Patient advised to contact office with any questions, adverse effects, or acute worsening in signs and symptoms.   Euline was seen today for depression and anxiety.  Diagnoses and all orders for this visit:  Mood disorder (HCC) -     Lumateperone  Tosylate (CAPLYTA ) 21 MG CAPS; Take 21 mg by mouth daily. -     Dextromethorphan-buPROPion  ER (AUVELITY ) 45-105 MG TBCR; Take 1 tablet by mouth 2 (two) times daily.  Generalized anxiety disorder  Panic disorder  Insomnia due to other mental disorder     Please see After Visit Summary for patient specific instructions.  No future appointments.   No orders of the defined types were placed in this encounter.     -------------------------------

## 2023-12-19 ENCOUNTER — Other Ambulatory Visit: Payer: Self-pay | Admitting: Psychiatry

## 2023-12-19 ENCOUNTER — Other Ambulatory Visit: Payer: Self-pay | Admitting: Family Medicine

## 2023-12-19 DIAGNOSIS — F411 Generalized anxiety disorder: Secondary | ICD-10-CM

## 2023-12-19 DIAGNOSIS — J4520 Mild intermittent asthma, uncomplicated: Secondary | ICD-10-CM

## 2023-12-19 DIAGNOSIS — F5105 Insomnia due to other mental disorder: Secondary | ICD-10-CM

## 2023-12-19 DIAGNOSIS — F410 Panic disorder [episodic paroxysmal anxiety] without agoraphobia: Secondary | ICD-10-CM

## 2024-01-08 ENCOUNTER — Telehealth: Payer: Self-pay

## 2024-01-08 NOTE — Telephone Encounter (Unsigned)
Copied from CRM [id number]. Topic: General - Other >> Jan 08, 2024  4:21 PM Fredrich Romans wrote: Reason for CRM: patient wanted to follow up with provider provider regarding depression that she discussed with her in December? She is on medication that se was prescribed by her psychiatrist  however she said its not working.She would like to know if Dr [place] may want to do some more labs or have an ideas that would help her?

## 2024-01-09 NOTE — Telephone Encounter (Signed)
I spoke with patient. She is wanting to know if we could check hormone levels? She also wanted to know if you had any recommendations for a new psychiatrist.

## 2024-01-09 NOTE — Telephone Encounter (Signed)
I cannot think about other blood work at this time. We have checked TSH,hepatic, and electrolytes during last visit. Her psychiatrist may be able to make recommendations in regard to further work up or treatment options for resistant depression. Thanks, BJ

## 2024-01-10 NOTE — Telephone Encounter (Signed)
She can discuss with her gyn if female hormone levels is appropriate, usually is not and does not impact depression management significantly (as far as I know).The one I usually check is thyroid hormone, which we already did. There are other treatments that can be explored by her psychiatrist, not conventional like Ketamine, transcranial magnetic stimulation, and electroconvulsive therapy among some. We can provide the list we have of psychiatrists in the area. Thanks, BJ

## 2024-01-19 DIAGNOSIS — F411 Generalized anxiety disorder: Secondary | ICD-10-CM | POA: Diagnosis not present

## 2024-01-19 DIAGNOSIS — F410 Panic disorder [episodic paroxysmal anxiety] without agoraphobia: Secondary | ICD-10-CM | POA: Diagnosis not present

## 2024-01-19 DIAGNOSIS — F3181 Bipolar II disorder: Secondary | ICD-10-CM | POA: Diagnosis not present

## 2024-01-19 DIAGNOSIS — G4700 Insomnia, unspecified: Secondary | ICD-10-CM | POA: Diagnosis not present

## 2024-02-16 DIAGNOSIS — G4700 Insomnia, unspecified: Secondary | ICD-10-CM | POA: Diagnosis not present

## 2024-02-16 DIAGNOSIS — F410 Panic disorder [episodic paroxysmal anxiety] without agoraphobia: Secondary | ICD-10-CM | POA: Diagnosis not present

## 2024-02-16 DIAGNOSIS — F3181 Bipolar II disorder: Secondary | ICD-10-CM | POA: Diagnosis not present

## 2024-02-16 DIAGNOSIS — F411 Generalized anxiety disorder: Secondary | ICD-10-CM | POA: Diagnosis not present

## 2024-03-15 DIAGNOSIS — F419 Anxiety disorder, unspecified: Secondary | ICD-10-CM | POA: Diagnosis not present

## 2024-03-15 DIAGNOSIS — F32A Depression, unspecified: Secondary | ICD-10-CM | POA: Diagnosis not present

## 2024-04-04 DIAGNOSIS — Z79899 Other long term (current) drug therapy: Secondary | ICD-10-CM | POA: Diagnosis not present

## 2024-04-04 DIAGNOSIS — F419 Anxiety disorder, unspecified: Secondary | ICD-10-CM | POA: Diagnosis not present

## 2024-04-04 DIAGNOSIS — F331 Major depressive disorder, recurrent, moderate: Secondary | ICD-10-CM | POA: Diagnosis not present

## 2024-04-04 DIAGNOSIS — Z5181 Encounter for therapeutic drug level monitoring: Secondary | ICD-10-CM | POA: Diagnosis not present

## 2024-04-04 DIAGNOSIS — F410 Panic disorder [episodic paroxysmal anxiety] without agoraphobia: Secondary | ICD-10-CM | POA: Diagnosis not present

## 2024-04-30 DIAGNOSIS — F410 Panic disorder [episodic paroxysmal anxiety] without agoraphobia: Secondary | ICD-10-CM | POA: Diagnosis not present

## 2024-04-30 DIAGNOSIS — Z79899 Other long term (current) drug therapy: Secondary | ICD-10-CM | POA: Diagnosis not present

## 2024-04-30 DIAGNOSIS — F331 Major depressive disorder, recurrent, moderate: Secondary | ICD-10-CM | POA: Diagnosis not present

## 2024-05-13 ENCOUNTER — Telehealth: Payer: Self-pay | Admitting: Family Medicine

## 2024-05-13 NOTE — Telephone Encounter (Unsigned)
 Copied from CRM [id number]. Topic: Clinical - Medication Question >> May 13, 2024  8:42 AM Danielle [name] wrote: Reason for CRM: Pt would like to know if she could be prescribe ozempic, wegovy, she mentioned the weight gain occur from anti-depression pills 10 years ago - pt can be reached [phone]

## 2024-05-15 ENCOUNTER — Telehealth: Payer: Self-pay

## 2024-05-15 NOTE — Telephone Encounter (Signed)
 I called and spoke with pt. I let her know that PCP does not prescribe weight loss medications. Pt is going to check with her med spa.

## 2024-05-15 NOTE — Telephone Encounter (Signed)
 Copied from CRM [id number]. Topic: Clinical - Medication Question >> May 15, 2024 11:38 AM Bambi Bonine D wrote: Reason for CRM: Pt would like to know if she could be prescribe ozempic, wegovy, she mentioned the weight gain occur from anti-depression pills 10 years ago - pt can be reached [phone]  UPDATE: Pt is calling to check on the status of the request. Pt would like to receive a call back today if possible regarding the status.

## 2024-05-29 DIAGNOSIS — F331 Major depressive disorder, recurrent, moderate: Secondary | ICD-10-CM | POA: Diagnosis not present

## 2024-05-29 DIAGNOSIS — Z79899 Other long term (current) drug therapy: Secondary | ICD-10-CM | POA: Diagnosis not present

## 2024-05-29 DIAGNOSIS — F419 Anxiety disorder, unspecified: Secondary | ICD-10-CM | POA: Diagnosis not present

## 2024-05-29 DIAGNOSIS — F410 Panic disorder [episodic paroxysmal anxiety] without agoraphobia: Secondary | ICD-10-CM | POA: Diagnosis not present

## 2024-05-31 DIAGNOSIS — F331 Major depressive disorder, recurrent, moderate: Secondary | ICD-10-CM | POA: Diagnosis not present

## 2024-05-31 DIAGNOSIS — F419 Anxiety disorder, unspecified: Secondary | ICD-10-CM | POA: Diagnosis not present

## 2024-06-06 ENCOUNTER — Institutional Professional Consult (permissible substitution): Admitting: Nurse Practitioner

## 2024-06-26 DIAGNOSIS — F332 Major depressive disorder, recurrent severe without psychotic features: Secondary | ICD-10-CM | POA: Diagnosis not present

## 2024-06-26 DIAGNOSIS — F419 Anxiety disorder, unspecified: Secondary | ICD-10-CM | POA: Diagnosis not present

## 2024-07-10 DIAGNOSIS — F332 Major depressive disorder, recurrent severe without psychotic features: Secondary | ICD-10-CM | POA: Diagnosis not present

## 2024-07-10 DIAGNOSIS — F419 Anxiety disorder, unspecified: Secondary | ICD-10-CM | POA: Diagnosis not present

## 2024-07-10 DIAGNOSIS — Z8659 Personal history of other mental and behavioral disorders: Secondary | ICD-10-CM | POA: Diagnosis not present

## 2024-07-12 DIAGNOSIS — F410 Panic disorder [episodic paroxysmal anxiety] without agoraphobia: Secondary | ICD-10-CM | POA: Diagnosis not present

## 2024-07-12 DIAGNOSIS — F419 Anxiety disorder, unspecified: Secondary | ICD-10-CM | POA: Diagnosis not present

## 2024-07-12 DIAGNOSIS — F331 Major depressive disorder, recurrent, moderate: Secondary | ICD-10-CM | POA: Diagnosis not present

## 2024-07-23 DIAGNOSIS — F419 Anxiety disorder, unspecified: Secondary | ICD-10-CM | POA: Diagnosis not present

## 2024-07-23 DIAGNOSIS — Z8659 Personal history of other mental and behavioral disorders: Secondary | ICD-10-CM | POA: Diagnosis not present

## 2024-07-23 DIAGNOSIS — F332 Major depressive disorder, recurrent severe without psychotic features: Secondary | ICD-10-CM | POA: Diagnosis not present

## 2024-08-01 DIAGNOSIS — F332 Major depressive disorder, recurrent severe without psychotic features: Secondary | ICD-10-CM | POA: Diagnosis not present

## 2024-08-01 DIAGNOSIS — F419 Anxiety disorder, unspecified: Secondary | ICD-10-CM | POA: Diagnosis not present

## 2024-08-01 DIAGNOSIS — Z8659 Personal history of other mental and behavioral disorders: Secondary | ICD-10-CM | POA: Diagnosis not present

## 2024-08-06 ENCOUNTER — Other Ambulatory Visit: Payer: Self-pay | Admitting: Family Medicine

## 2024-08-15 DIAGNOSIS — Z8659 Personal history of other mental and behavioral disorders: Secondary | ICD-10-CM | POA: Diagnosis not present

## 2024-08-15 DIAGNOSIS — F332 Major depressive disorder, recurrent severe without psychotic features: Secondary | ICD-10-CM | POA: Diagnosis not present

## 2024-08-15 DIAGNOSIS — F419 Anxiety disorder, unspecified: Secondary | ICD-10-CM | POA: Diagnosis not present

## 2024-09-06 DIAGNOSIS — F331 Major depressive disorder, recurrent, moderate: Secondary | ICD-10-CM | POA: Diagnosis not present

## 2024-09-06 DIAGNOSIS — F332 Major depressive disorder, recurrent severe without psychotic features: Secondary | ICD-10-CM | POA: Diagnosis not present

## 2024-09-06 DIAGNOSIS — F419 Anxiety disorder, unspecified: Secondary | ICD-10-CM | POA: Diagnosis not present

## 2024-09-13 DIAGNOSIS — Z1329 Encounter for screening for other suspected endocrine disorder: Secondary | ICD-10-CM | POA: Diagnosis not present

## 2024-09-13 DIAGNOSIS — Z6826 Body mass index (BMI) 26.0-26.9, adult: Secondary | ICD-10-CM | POA: Diagnosis not present

## 2024-09-13 DIAGNOSIS — Z01419 Encounter for gynecological examination (general) (routine) without abnormal findings: Secondary | ICD-10-CM | POA: Diagnosis not present

## 2024-09-13 DIAGNOSIS — Z1231 Encounter for screening mammogram for malignant neoplasm of breast: Secondary | ICD-10-CM | POA: Diagnosis not present

## 2024-09-13 DIAGNOSIS — Z131 Encounter for screening for diabetes mellitus: Secondary | ICD-10-CM | POA: Diagnosis not present

## 2024-09-13 DIAGNOSIS — Z13228 Encounter for screening for other metabolic disorders: Secondary | ICD-10-CM | POA: Diagnosis not present

## 2024-09-18 DIAGNOSIS — F332 Major depressive disorder, recurrent severe without psychotic features: Secondary | ICD-10-CM | POA: Diagnosis not present

## 2024-09-18 DIAGNOSIS — Z8659 Personal history of other mental and behavioral disorders: Secondary | ICD-10-CM | POA: Diagnosis not present

## 2024-09-18 DIAGNOSIS — F419 Anxiety disorder, unspecified: Secondary | ICD-10-CM | POA: Diagnosis not present

## 2024-10-03 DIAGNOSIS — F332 Major depressive disorder, recurrent severe without psychotic features: Secondary | ICD-10-CM | POA: Diagnosis not present

## 2024-10-03 DIAGNOSIS — F419 Anxiety disorder, unspecified: Secondary | ICD-10-CM | POA: Diagnosis not present

## 2024-10-16 DIAGNOSIS — F410 Panic disorder [episodic paroxysmal anxiety] without agoraphobia: Secondary | ICD-10-CM | POA: Diagnosis not present

## 2024-10-16 DIAGNOSIS — F419 Anxiety disorder, unspecified: Secondary | ICD-10-CM | POA: Diagnosis not present

## 2024-10-16 DIAGNOSIS — F331 Major depressive disorder, recurrent, moderate: Secondary | ICD-10-CM | POA: Diagnosis not present

## 2024-10-24 DIAGNOSIS — F419 Anxiety disorder, unspecified: Secondary | ICD-10-CM | POA: Diagnosis not present

## 2024-10-24 DIAGNOSIS — F332 Major depressive disorder, recurrent severe without psychotic features: Secondary | ICD-10-CM | POA: Diagnosis not present

## 2024-11-01 DIAGNOSIS — N951 Menopausal and female climacteric states: Secondary | ICD-10-CM | POA: Diagnosis not present

## 2024-11-21 DIAGNOSIS — F332 Major depressive disorder, recurrent severe without psychotic features: Secondary | ICD-10-CM | POA: Diagnosis not present

## 2024-11-21 DIAGNOSIS — F419 Anxiety disorder, unspecified: Secondary | ICD-10-CM | POA: Diagnosis not present

## 2024-11-21 DIAGNOSIS — Z6379 Other stressful life events affecting family and household: Secondary | ICD-10-CM | POA: Diagnosis not present

## 2024-12-09 ENCOUNTER — Ambulatory Visit: Payer: Self-pay

## 2024-12-09 ENCOUNTER — Telehealth: Admitting: Physician Assistant

## 2024-12-09 DIAGNOSIS — R6889 Other general symptoms and signs: Secondary | ICD-10-CM | POA: Diagnosis not present

## 2024-12-09 DIAGNOSIS — Z20828 Contact with and (suspected) exposure to other viral communicable diseases: Secondary | ICD-10-CM | POA: Diagnosis not present

## 2024-12-09 DIAGNOSIS — R051 Acute cough: Secondary | ICD-10-CM

## 2024-12-09 DIAGNOSIS — R52 Pain, unspecified: Secondary | ICD-10-CM

## 2024-12-09 MED ORDER — BENZONATATE 100 MG PO CAPS: 100.0000 mg | ORAL_CAPSULE | Freq: Three times a day (TID) | ORAL | 0 refills | Status: DC | PRN

## 2024-12-09 MED ORDER — NAPROXEN 500 MG PO TABS: 500.0000 mg | ORAL_TABLET | Freq: Two times a day (BID) | ORAL | 0 refills | Status: DC

## 2024-12-09 MED ORDER — OSELTAMIVIR PHOSPHATE 75 MG PO CAPS: 75.0000 mg | ORAL_CAPSULE | Freq: Two times a day (BID) | ORAL | 0 refills | Status: DC

## 2024-12-09 NOTE — Telephone Encounter (Signed)
 FYI Only or Action Required?: FYI only for provider: advised to start Tamiflu .  Patient was last seen in primary care on 11/17/2023 by Jordan, Betty G, MD.  Called Nurse Triage reporting Influenza.  Symptoms began 2 days ago.  Interventions attempted: OTC medications: tylenol .  Symptoms are: unchanged.  Triage Disposition: Home Care  Patient/caregiver understands and will follow disposition?: Yes    Copied from CRM #[id number]. Topic: Clinical - Red Word Triage >> Dec 09, 2024  7:57 AM Harlene ORN wrote: Red Word that prompted transfer to Nurse Triage: flu symptoms started 12/27th. Getting worse. Cough, congestion, runny nose, full body aches, high fever not coming down with meds, lightheadedness, and no appetite. Reason for Disposition  [1] Influenza diagnosed by doctor (or NP/PA) AND [2] no complications  Answer Assessment - Initial Assessment Questions E visit today. MD sent message that tamiflu  was sent to pharmacy. Caller verifies her mother didn't test for flu. Questions if should still take tamiflu .   1. DIAGNOSIS CONFIRMATION: When was the influenza diagnosed? By whom? Did you get a test for it?     Not test done 2. MEDICINES: Were you prescribed any medications for the influenza?  (e.g., zanamivir [Relenza], oseltamavir [Tamiflu ]).      Tamiflu , not started 3. ONSET of SYMPTOMS: When did your symptoms start?     Fever onset 12/27 4. SYMPTOMS: What symptoms are you most concerned about? (e.g., runny nose, stuffy nose, sore throat, cough, breathing difficulty, fever)     fever 5. COUGH: How bad is the cough?      6. FEVER: Do you have a fever? If Yes, ask: What is your temperature, how was it measured, and when did it start?     100.4 after meds 7. RESPIRATORY DISTRESS: Are you having any trouble breathing? If Yes, ask: Describe your breathing.      Using inhaler qid, chest feels a little tight.   10. HIGH RISK for COMPLICATIONS: Do you have any  heart or lung problems? Do you have a weakened immune system? (e.g., CHF, COPD, asthma, HIV positive, chemotherapy, renal failure, diabetes mellitus, sickle cell anemia)       asthma  Protocols used: Influenza Follow-up Call-A-AH

## 2024-12-09 NOTE — Progress Notes (Signed)
 E visit for Flu like symptoms   We are sorry that you are not feeling well.  Here is how we plan to help! Based on what you have shared with me it looks like you may have a respiratory virus that may be influenza.  Influenza or the flu is  an infection caused by a respiratory virus. The flu virus is highly contagious and persons who did not receive their yearly flu vaccination may catch the flu from close contact.  We have anti-viral medications to treat the viruses that cause this infection. They are not a cure and only shorten the course of the infection. These prescriptions are most effective when they are given within the first 2 days of flu symptoms. Antiviral medications are indicated if you have a high risk of complications from the flu. You should  also consider an antiviral medication if you are in close contact with someone who is at risk. These medications can help patients avoid complications from the flu but have side effects that you should know.   Possible side effects from Tamiflu  or oseltamivir  include nausea, vomiting, diarrhea, dizziness, headaches, eye redness, sleep problems or other respiratory symptoms. You should not take Tamiflu  if you have an allergy to oseltamivir  or any to the ingredients in Tamiflu .  Based upon your symptoms and potential risk factors I have prescribed Oseltamivir  (Tamiflu ).  It has been sent to your designated pharmacy.  You will take one 75 mg capsule orally twice a day for the next 5 days.   For nasal congestion, you may use an oral decongestant such as Mucinex D or if you have glaucoma or high blood pressure use plain Mucinex.  Saline nasal spray or nasal drops can help and can safely be used as often as needed for congestion.  If you have a sore or scratchy throat, use a saltwater gargle-  to  teaspoon of salt dissolved in a 4-ounce to 8-ounce glass of warm water.  Gargle the solution for approximately 15-30 seconds and then spit.  It is  important not to swallow the solution.  You can also use throat lozenges/cough drops and Chloraseptic spray to help with throat pain or discomfort.  Warm or cold liquids can also be helpful in relieving throat pain.  For headache, pain or general discomfort, you can use Ibuprofen  or Tylenol  as directed.   Some authorities believe that zinc sprays or the use of Echinacea may shorten the course of your symptoms.  I have prescribed the following medications to help lessen symptoms: I have prescribed Tessalon  Perles 100 mg. You may take 1-2 capsules every 8 hours as needed for cough and I have prescribed an anti-inflammatory - Naprosyn 500 mg. Take twice daily as needed for fever or body aches for 2 weeks  You are to isolate at home until you have been fever-free for at least 24 hours without a fever-reducing medication, and symptoms have been steadily improving for 24 hours.  If you must be around other household members who do not have symptoms, you need to make sure that both you and the family members are masking consistently with a high-quality mask.  If you note any worsening of symptoms despite treatment, please seek an in-person evaluation ASAP. If you note any significant shortness of breath or any chest pain, please seek ED evaluation. Please do not delay care!  ANYONE WHO HAS FLU SYMPTOMS SHOULD: Stay home. The flu is highly contagious and going out or to work exposes others! Be  sure to drink plenty of fluids. Water is fine as well as fruit juices, sodas and electrolyte beverages. You may want to stay away from caffeine or alcohol. If you are nauseated, try taking small sips of liquids. How do you know if you are getting enough fluid? Your urine should be a pale yellow or almost colorless. Get rest. Taking a steamy shower or using a humidifier may help nasal congestion and ease sore throat pain. Using a saline nasal spray works much the same way. Cough drops, hard candies and sore throat  lozenges may ease your cough. Line up a caregiver. Have someone check on you regularly.  GET HELP RIGHT AWAY IF: You cannot keep down liquids or your medications. You become short of breath Your fell like you are going to pass out or loose consciousness. Your symptoms persist after you have completed your treatment plan  MAKE SURE YOU  Understand these instructions. Will watch your condition. Will get help right away if you are not doing well or get worse.  Your e-visit answers were reviewed by a board certified advanced clinical practitioner to complete your personal care plan.  Depending on the condition, your plan could have included both over the counter or prescription medications.  If there is a problem please reply  once you have received a response from your provider.  Your safety is important to us .  If you have drug allergies check your prescription carefully.    You can use MyChart to ask questions about todays visit, request a non-urgent call back, or ask for a work or school excuse for 24 hours related to this e-Visit. If it has been greater than 24 hours you will need to follow up with your provider, or enter a new e-Visit to address those concerns.  You will get an e-mail in the next two days asking about your experience.  I hope that your e-visit has been valuable and will speed your recovery. Thank you for using e-visits.   I have spent 5 minutes in review of e-visit questionnaire, review and updating patient chart, medical decision making and response to patient.   Delon CHRISTELLA Dickinson, PA-C

## 2024-12-13 ENCOUNTER — Ambulatory Visit: Payer: Self-pay

## 2024-12-13 NOTE — Telephone Encounter (Signed)
 FYI Only or Action Required?: FYI only for provider: appointment scheduled on 12/17/24 and UC TODAY, PATIENT REFUSED.  Patient was last seen in primary care on 11/17/2023 by Jordan, Betty G, MD.  Called Nurse Triage reporting Influenza.  Symptoms began several days ago.  Interventions attempted: Prescription medications: Tamaflu.  Symptoms are: gradually worsening.  Triage Disposition: See Physician Within 24 Hours  Patient/caregiver understands and will follow disposition?:  Reason for Disposition  [place]  Answer Assessment - Initial Assessment Questions Patient had virtual visit 12/29, dx with flu, started [place] flu. 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 Patient wanted to schedule an appointment next Tuesday just to have it, and be added to canelation list and will monitor her symptoms.   1. WORST SYMPTOM: What is your worst symptom? (e.g., cough, runny nose, muscle aches, headache, sore throat, fever)      Drowning in congestion, sore throat, could barely sleep.   2. ONSET: When did your flu symptoms start?      5 days ago  3. COUGH: How bad is the cough?       Terrible, productive, pale yellow.   4. RESPIRATORY DISTRESS: Describe your breathing.      Denies SOB or wheezing, but chest feels tight so using inhaler  5. FEVER: Do you have a fever? If Yes, ask: What is your temperature, how was it measured, and when did it start?     Denies, had one at beginning of week  6. HIGH RISK DISEASE: Do you have any chronic medical problems? (e.g., heart or lung disease, asthma, weak immune system, or other HIGH RISK conditions)     Asthma  7. OTHER SYMPTOMS: Do you have any other symptoms?  (e.g., runny nose, muscle aches, headache, sore throat)       Body aches, nasal congestion, mild left ear pain.  Protocols used: Influenza (Flu) [facility]  Copied from CRM [id number]. Topic:  Clinical - Red Word Triage >> Dec 13, 2024  8:58 AM Logan F wrote: Red Word that prompted transfer to Nurse Triage: Had the flu earlier this week and it started to get better. She says it feels like it changed. She says her throat feels like she has knives in it, very congested in the head and chest, coughing up phlegm (pale yellow), and bidyaches. She says she was so congested that she had to throw up this morning. She says she has been taking Tamiflu  on Monday. She says that she had a virtual appt where she was diagnosed with the flu so a test was not actually taken.

## 2024-12-16 ENCOUNTER — Ambulatory Visit: Admitting: Family Medicine

## 2024-12-17 ENCOUNTER — Encounter: Payer: Self-pay | Admitting: Family Medicine

## 2024-12-17 ENCOUNTER — Ambulatory Visit: Admitting: Family Medicine

## 2024-12-17 VITALS — BP 110/68 | Temp 98.4°F | Resp 16 | Ht 61.0 in | Wt 130.0 lb

## 2024-12-17 DIAGNOSIS — Z111 Encounter for screening for respiratory tuberculosis: Secondary | ICD-10-CM | POA: Diagnosis not present

## 2024-12-17 DIAGNOSIS — R059 Cough, unspecified: Secondary | ICD-10-CM

## 2024-12-17 NOTE — Patient Instructions (Addendum)
 A few things to remember from today's visit:  Screening-pulmonary TB - Plan: PPD  Cough, unspecified type  Monitor for new symptoms. Cough and congestion may last a few more days and even weeks.  Do not use My Chart to request refills or for acute issues that need immediate attention. If you send a my chart message, it may take a few days to be addressed, specially if I am not in the office.  Please be sure medication list is accurate. If a new problem present, please set up appointment sooner than planned today.

## 2024-12-17 NOTE — Progress Notes (Unsigned)
 "  ACUTE VISIT Chief Complaint  Patient presents with   Sinus Problem    Sore throat and congestion ; lingering cough    HPI: [name] is a 44 y.o. female, who is here today complaining of *** Discussed the use of AI scribe software for clinical note transcription with the patient, who gave verbal consent to proceed.  History of Present Illness Danielle [name] is a 44 year old female with asthma and allergies who presents with a persistent cough following a recent flu infection.  She and her family experienced a severe flu infection over the Christmas break, during which she had a fever for four days. Although the fever subsided, she developed a persistent cough and chest discomfort described as 'yucky'.  Unable to secure a timely appointment, she self-medicated with amoxicillin  from a previous dental prescription. She reports some improvement in symptoms but continues to experience a persistent cough.  She has a history of asthma and allergies, which often lead to prolonged coughing and mucus production after viral infections. She recalls a similar prolonged cough lasting three months following a COVID-19 infection.  She is currently taking semaglutide for weight management and has successfully lost over thirty pounds. She has reduced the dose to maintain her current weight and reports overall improvement in physical and emotional well-being, including less back pain.  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 She requires a TB skin test for her new job and has not been on steroids or prednisone  recently.   Review of Systems See other pertinent positives and negatives in HPI.  Medications Ordered Prior to Encounter[1]  Past Medical History:  Diagnosis Date   Anxiety    Asthma    Constipation    Depression    Generalized headaches    GERD  (gastroesophageal reflux disease)    IBS (irritable bowel syndrome)    Other and unspecified ovarian cysts    PONV (postoperative nausea and vomiting)    Pulse irregularity    Rapid heart rate    Seasonal allergies    Allergies[2]  Social History   Socioeconomic History   Marital status: Married    Spouse name: Not on file   Number of children: 2   Years of education: Not on file   Highest education level: Associate degree: occupational, scientist, product/process development, or vocational program  Occupational History   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  Tobacco Use   Smoking status: Never   Smokeless tobacco: Never  Vaping Use   Vaping status: Never Used  Substance and Sexual Activity   Alcohol use: Yes    Alcohol/week: 1.0 standard drink of alcohol    Types: 1 Standard drinks or equivalent per week    Comment: occasionally   Drug use: No   Sexual activity: Not on file  Other Topics Concern   Not on file  Social History Narrative   Not on file   Social Drivers of Health   Tobacco Use: Low Risk (12/17/2024)   Patient History    Smoking Tobacco Use: Never    Smokeless Tobacco Use: Never    Passive Exposure: Not on file  Financial Resource Strain: Low Risk (07/01/2022)   Overall Financial Resource Strain (CARDIA)    Difficulty of Paying Living Expenses: Not hard at all  Food Insecurity: No Food Insecurity (07/01/2022)   Hunger Vital Sign    Worried  About Running Out of Food in the Last Year: Never true    Ran Out of Food in the Last Year: Never true  Transportation Needs: No Transportation Needs (07/01/2022)   PRAPARE - Administrator, Civil Service (Medical): No    Lack of Transportation (Non-Medical): No  Physical Activity: Insufficiently Active (07/01/2022)   Exercise Vital Sign    Days of Exercise per Week: 2 days    Minutes of Exercise per Session: 20 min  Stress: Stress Concern Present (07/01/2022)   [employer] of Occupational Health - Occupational Stress Questionnaire     Feeling of Stress : Rather much  Social Connections: Moderately Isolated (07/01/2022)   Social Connection and Isolation Panel    Frequency of Communication with Friends and Family: More than three times a week    Frequency of Social Gatherings with Friends and Family: Twice a week    Attends Religious Services: Never    Database Administrator or Organizations: No    Attends Engineer, Structural: Not on file    Marital Status: Married  Depression (PHQ2-9): Medium Risk (11/17/2023)   Depression (PHQ2-9)    PHQ-2 Score: 10  Alcohol Screen: Low Risk (07/01/2022)   Alcohol Screen    Last Alcohol Screening Score (AUDIT): 3  Housing: Low Risk (07/01/2022)   Housing    Last Housing Risk Score: 0  Utilities: Not on file  Health Literacy: Not on file    Vitals:   12/17/24 1352  BP: 110/68  Resp: 16  Temp: 98.4 F (36.9 C)   Body mass index is 24.56 kg/m.  Physical Exam Vitals and nursing note reviewed.  Constitutional:      General: She is not in acute distress.    Appearance: She is well-developed. She is not ill-appearing.  HENT:     Head: Normocephalic and atraumatic.     Right Ear: Tympanic membrane, ear canal and external ear normal.     Left Ear: Tympanic membrane, ear canal and external ear normal.     Nose: Congestion present.     Mouth/Throat:     Mouth: Mucous membranes are moist.     Pharynx: Oropharynx is clear.  Eyes:     Conjunctiva/sclera: Conjunctivae normal.  Cardiovascular:     Rate and Rhythm: Normal rate and regular rhythm.     Heart sounds: No murmur heard. Pulmonary:     Effort: Pulmonary effort is normal. No respiratory distress.     Breath sounds: Normal breath sounds. No stridor.  Lymphadenopathy:     Cervical: No cervical adenopathy.  Skin:    General: Skin is warm.     Findings: No erythema or rash.  Neurological:     Mental Status: She is alert and oriented to person, place, and time.  Psychiatric:        Mood and Affect: Mood and  affect normal.    ASSESSMENT AND PLAN:  Danielle [name] was seen today for sinus problem.  Diagnoses and all orders for this visit:  Cough, unspecified type  Screening-pulmonary TB -     PPD    Orders Placed This Encounter  Procedures   PPD    No problem-specific Assessment & Plan notes found for this encounter.    Return if symptoms worsen or fail to improve, for keep next appointment.  Fortino Haag G. Keisuke Hollabaugh, MD  [facility]. [place] office.     [1]  Current Outpatient Medications on File Prior to Visit  Medication Sig Dispense  Refill   albuterol  (VENTOLIN  HFA) 108 (90 Base) MCG/ACT inhaler INHALE 2 PUFFS INTO THE LUNGS EVERY 6 (SIX) HOURS AS NEEDED. FOR WHEEZE OR SHORTNESS OF BREATH 8.5 each 2   amitriptyline  (ELAVIL ) 50 MG tablet Take 1 tablet (50 mg total) by mouth at bedtime. 90 tablet 3   benzonatate  (TESSALON ) 100 MG capsule Take 1-2 capsules (100-200 mg total) by mouth 3 (three) times daily as needed. 30 capsule 0   cetirizine (ZYRTEC) 10 MG tablet Take 10 mg by mouth daily as needed.     clobetasol  ointment (TEMOVATE ) 0.05 % Apply 1 Application topically daily as needed. 30 g 2   clonazePAM  (KLONOPIN ) 0.5 MG tablet Take 1 tablet (0.5 mg total) by mouth 2 (two) times daily as needed for anxiety. 60 tablet 5   esomeprazole  (NEXIUM ) 40 MG capsule Take 1 capsule (40 mg total) by mouth daily. 90 capsule 3   Dextromethorphan-buPROPion  ER (AUVELITY ) 45-105 MG TBCR Take 1 tablet by mouth 2 (two) times daily. (Patient not taking: Reported on 12/17/2024) 60 tablet 2   fluticasone (FLONASE) 50 MCG/ACT nasal spray Place into both nostrils daily as needed for allergies or rhinitis. (Patient not taking: Reported on 12/17/2024)     Lumateperone  Tosylate (CAPLYTA ) 21 MG CAPS Take 21 mg by mouth daily. (Patient not taking: Reported on 12/17/2024) 30 capsule 2   naproxen  (NAPROSYN ) 500 MG tablet Take 1 tablet (500 mg total) by mouth 2 (two) times daily with a meal. (Patient not taking:  Reported on 12/17/2024) 30 tablet 0   oseltamivir  (TAMIFLU ) 75 MG capsule Take 1 capsule (75 mg total) by mouth 2 (two) times daily. (Patient not taking: Reported on 12/17/2024) 10 capsule 0   No current facility-administered medications on file prior to visit.  [2]  Allergies Allergen Reactions   Doxycycline Nausea And Vomiting    Other reaction(s): Vomiting   "

## 2024-12-19 ENCOUNTER — Ambulatory Visit

## 2024-12-19 LAB — TB SKIN TEST
Induration: 0 mm
TB Skin Test: NEGATIVE

## 2024-12-19 NOTE — Progress Notes (Signed)
"  PPD Reading Note  PPD read and results entered in EpicCare.  Result: 0 mm induration.  Interpretation: Negative  If test not read within 48-72 hours of initial placement, patient advised to repeat in other arm 1-3 weeks after this test.  Allergic reaction: no   "

## 2025-01-10 ENCOUNTER — Ambulatory Visit: Admitting: Family Medicine

## 2025-01-10 ENCOUNTER — Encounter: Payer: Self-pay | Admitting: Family Medicine

## 2025-01-10 VITALS — BP 110/68 | HR 97 | Temp 98.3°F | Resp 16 | Ht 61.0 in | Wt 131.6 lb

## 2025-01-10 DIAGNOSIS — Z1329 Encounter for screening for other suspected endocrine disorder: Secondary | ICD-10-CM

## 2025-01-10 DIAGNOSIS — K219 Gastro-esophageal reflux disease without esophagitis: Secondary | ICD-10-CM

## 2025-01-10 DIAGNOSIS — Z1211 Encounter for screening for malignant neoplasm of colon: Secondary | ICD-10-CM

## 2025-01-10 DIAGNOSIS — E785 Hyperlipidemia, unspecified: Secondary | ICD-10-CM

## 2025-01-10 DIAGNOSIS — Z130 Encounter for screening for diseases of the blood and blood-forming organs and certain disorders involving the immune mechanism: Secondary | ICD-10-CM

## 2025-01-10 DIAGNOSIS — Z13228 Encounter for screening for other metabolic disorders: Secondary | ICD-10-CM | POA: Diagnosis not present

## 2025-01-10 DIAGNOSIS — Z0000 Encounter for general adult medical examination without abnormal findings: Secondary | ICD-10-CM | POA: Diagnosis not present

## 2025-01-10 DIAGNOSIS — J4520 Mild intermittent asthma, uncomplicated: Secondary | ICD-10-CM

## 2025-01-10 LAB — COMPREHENSIVE METABOLIC PANEL WITH GFR
ALT: 6 U/L (ref 3–35)
AST: 13 U/L (ref 5–37)
Albumin: 4.1 g/dL (ref 3.5–5.2)
Alkaline Phosphatase: 51 U/L (ref 39–117)
BUN: 9 mg/dL (ref 6–23)
CO2: 28 meq/L (ref 19–32)
Calcium: 9.6 mg/dL (ref 8.4–10.5)
Chloride: 103 meq/L (ref 96–112)
Creatinine, Ser: 0.6 mg/dL (ref 0.40–1.20)
GFR: 108.59 mL/min
Glucose, Bld: 78 mg/dL (ref 70–99)
Potassium: 4 meq/L (ref 3.5–5.1)
Sodium: 138 meq/L (ref 135–145)
Total Bilirubin: 0.6 mg/dL (ref 0.2–1.2)
Total Protein: 7.8 g/dL (ref 6.0–8.3)

## 2025-01-10 LAB — LIPID PANEL
Cholesterol: 181 mg/dL (ref 28–200)
HDL: 53.1 mg/dL
LDL Cholesterol: 106 mg/dL — ABNORMAL HIGH (ref 10–99)
NonHDL: 127.72
Total CHOL/HDL Ratio: 3
Triglycerides: 107 mg/dL (ref 10.0–149.0)
VLDL: 21.4 mg/dL (ref 0.0–40.0)

## 2025-01-10 MED ORDER — ALBUTEROL SULFATE HFA 108 (90 BASE) MCG/ACT IN AERS: 1.0000 | INHALATION_SPRAY | Freq: Four times a day (QID) | RESPIRATORY_TRACT | 2 refills | Status: AC | PRN

## 2025-01-10 MED ORDER — ESOMEPRAZOLE MAGNESIUM 40 MG PO CPDR: 40.0000 mg | DELAYED_RELEASE_CAPSULE | Freq: Every day | ORAL | 3 refills | Status: AC

## 2025-01-10 NOTE — Patient Instructions (Addendum)
 A few things to remember from today's visit:  Routine general medical examination at a health care facility  Hyperlipidemia, unspecified hyperlipidemia type - Plan: Comprehensive metabolic panel with GFR, Lipid panel  Colon cancer screening - Plan: Ambulatory referral to Gastroenterology  Screening for endocrine, metabolic and immunity disorder - Plan: Comprehensive metabolic panel with GFR  Gastroesophageal reflux disease, unspecified whether esophagitis present - Plan: esomeprazole  (NEXIUM ) 40 MG capsule  If you need refills for medications you take chronically, please call your pharmacy. Do not use My Chart to request refills or for acute issues that need immediate attention. If you send a my chart message, it may take a few days to be addressed, specially if I am not in the office.  Please be sure medication list is accurate. If a new problem present, please set up appointment sooner than planned today.  Health Maintenance, Female Adopting a healthy lifestyle and getting preventive care are important in promoting health and wellness. Ask your health care provider about: The right schedule for you to have regular tests and exams. Things you can do on your own to prevent diseases and keep yourself healthy. What should I know about diet, weight, and exercise? Eat a healthy diet  Eat a diet that includes plenty of vegetables, fruits, low-fat dairy products, and lean protein. Do not eat a lot of foods that are high in solid fats, added sugars, or sodium. Maintain a healthy weight Body mass index (BMI) is used to identify weight problems. It estimates body fat based on height and weight. Your health care provider can help determine your BMI and help you achieve or maintain a healthy weight. Get regular exercise Get regular exercise. This is one of the most important things you can do for your health. Most adults should: Exercise for at least 150 minutes each week. The exercise should  increase your heart rate and make you sweat (moderate-intensity exercise). Do strengthening exercises at least twice a week. This is in addition to the moderate-intensity exercise. Spend less time sitting. Even light physical activity can be beneficial. Watch cholesterol and blood lipids Have your blood tested for lipids and cholesterol at 46 years of age, then have this test every 5 years. Have your cholesterol levels checked more often if: Your lipid or cholesterol levels are high. You are older than 46 years of age. You are at high risk for heart disease. What should I know about cancer screening? Depending on your health history and family history, you may need to have cancer screening at various ages. This may include screening for: Breast cancer. Cervical cancer. Colorectal cancer. Skin cancer. Lung cancer. What should I know about heart disease, diabetes, and high blood pressure? Blood pressure and heart disease High blood pressure causes heart disease and increases the risk of stroke. This is more likely to develop in people who have high blood pressure readings or are overweight. Have your blood pressure checked: Every 3-5 years if you are 66-68 years of age. Every year if you are 34 years old or older. Diabetes Have regular diabetes screenings. This checks your fasting blood sugar level. Have the screening done: Once every three years after age 19 if you are at a normal weight and have a low risk for diabetes. More often and at a younger age if you are overweight or have a high risk for diabetes. What should I know about preventing infection? Hepatitis B If you have a higher risk for hepatitis B, you should be screened  for this virus. Talk with your health care provider to find out if you are at risk for hepatitis B infection. Hepatitis C Testing is recommended for: Everyone born from [birth year] through 1967. Anyone with known risk factors for hepatitis C. Sexually transmitted  infections (STIs) Get screened for STIs, including gonorrhea and chlamydia, if: You are sexually active and are younger than 46 years of age. You are older than 46 years of age and your health care provider tells you that you are at risk for this type of infection. Your sexual activity has changed since you were last screened, and you are at increased risk for chlamydia or gonorrhea. Ask your health care provider if you are at risk. Ask your health care provider about whether you are at high risk for HIV. Your health care provider may recommend a prescription medicine to help prevent HIV infection. If you choose to take medicine to prevent HIV, you should first get tested for HIV. You should then be tested every 3 months for as long as you are taking the medicine. Pregnancy If you are about to stop having your period (premenopausal) and you may become pregnant, seek counseling before you get pregnant. Take 400 to 800 micrograms (mcg) of folic acid every day if you become pregnant. Ask for birth control (contraception) if you want to prevent pregnancy. Osteoporosis and menopause Osteoporosis is a disease in which the bones lose minerals and strength with aging. This can result in bone fractures. If you are 9 years old or older, or if you are at risk for osteoporosis and fractures, ask your health care provider if you should: Be screened for bone loss. Take a calcium or vitamin D supplement to lower your risk of fractures. Be given hormone replacement therapy (HRT) to treat symptoms of menopause. Follow these instructions at home: Alcohol use Do not drink alcohol if: Your health care provider tells you not to drink. You are pregnant, may be pregnant, or are planning to become pregnant. If you drink alcohol: Limit how much you have to: 0-1 drink a day. Know how much alcohol is in your drink. In the [place] one drink equals one 12 oz bottle of beer (355 mL), one 5 oz glass of wine (148 mL), or one  1 oz glass of hard liquor (44 mL). Lifestyle Do not use any products that contain nicotine or tobacco. These products include cigarettes, chewing tobacco, and vaping devices, such as e-cigarettes. If you need help quitting, ask your health care provider. Do not use street drugs. Do not share needles. Ask your health care provider for help if you need support or information about quitting drugs. General instructions Schedule regular health, dental, and eye exams. Stay current with your vaccines. Tell your health care provider if: You often feel depressed. You have ever been abused or do not feel safe at home. This information is not intended to replace advice given to you by your health care provider. Make sure you discuss any questions you have with your health care provider. Document Revised: 06/05/2024 Document Reviewed: 04/19/2021 Elsevier Patient Education  2025 [employer].

## 2025-01-10 NOTE — Progress Notes (Unsigned)
 "  HPI: [name] is a 44 y.o. female with a PMHx significant for Asthma, GERD, HLD, GAD,and depression, who is here today for her routine physical.  Last CPE: 11/17/23    Exercise Patient states she is walking once per week. She says her depressive symptoms make it hard for her to motivate herself to exercise.  Diet: She says her appetite has been decreased for most of the year. She is avoiding soft drinks and sugar. She is eating vegetables daily.  Sleep: She reports she is sleeping 8-9 hours per night, and napping occasionally. Vision: She hasn't seen an eye care provider.  Dental: UTD on routine dental care.  Discussed the use of AI scribe software for clinical note transcription with the patient, who gave verbal consent to proceed.  History of Present Illness Danielle [name] is a 44 year old female who presents for a routine follow-up and discussion of colon cancer screening.  She has a history of depression and is considering Spravato (S-ketamine) as a treatment option. She has been on various antidepressants for ten years without significant improvement and is currently taking Vraylar, an antipsychotic, which was started a few weeks ago. She has not noticed a significant difference yet. She sees her psychiatrist every three months.  She is participating in a weight loss program and is using semaglutide, a GLP-1 agonist, which has resulted in a weight loss of approximately 35 pounds. She experiences lightheadedness, particularly when standing up, which she attributes to the medication. Her gynecologist noted a very low A1c level. She drinks a lot of water and consumes less alcohol due to feeling unwell after drinking while on semaglutide.  Her sleep is variable, with difficulty falling asleep and getting between five to eight hours of sleep per night. She does not exercise regularly, citing lack of energy and motivation when feeling down. She is trying to eat an adequate  amount of protein but finds it challenging.  She has a history of asthma and uses albuterol  as needed, typically once or twice a week, and more frequently when ill. She takes esomeprazole  daily before a meal for gastroesophageal reflux disease.  Her last mammogram was in November, and her gynecological care is up to date. Her cholesterol has been mildly elevated, but her blood sugar and kidney function have been normal. She has received her flu shot and is up to date with her tetanus vaccination until 2027.   Immunization History  Administered Date(s) Administered   Influenza Inj Mdck Quad Pf 08/23/2019, 08/14/2022   Influenza, Mdck, Trivalent,PF 6+ MOS(egg free) 09/19/2023, 08/06/2024   Influenza, Seasonal, Injecte, Preservative Fre 08/20/2015   Influenza,inj,Quad PF,6+ Mos 07/29/2018, 08/11/2020, 08/13/2021   Influenza,inj,quad, With Preservative 08/30/2016   Influenza-Unspecified 08/31/2017, 07/31/2020, 08/13/2021, 08/12/2022, 08/15/2022, 09/06/2023, 08/08/2024   PFIZER(Purple Top)SARS-COV-2 Vaccination 01/06/2020, 01/27/2020, 09/04/2020   PPD Test 11/25/2016, 11/29/2017, 11/14/2018, 10/29/2019, 10/28/2020, 11/08/2021, 12/17/2024   Pfizer Covid-19 Vaccine Bivalent Booster 65yrs & up 08/18/2021   Pfizer(Comirnaty)Fall Seasonal Vaccine 12 years and older 09/04/2022   Tdap 11/25/2016   Unspecified SARS-COV-2 Vaccination 09/03/2020, 08/18/2021, 08/25/2022   Health Maintenance  Topic Date Due   Cervical Cancer Screening (HPV/Pap Cotest)  10/13/2017   Mammogram  07/08/2021   Colonoscopy  Never done   COVID-19 Vaccine (7 - 2025-26 season) 01/26/2025 (Originally 08/12/2024)   Pneumococcal Vaccine (1 of 2 - PCV) 01/10/2026 (Originally 11/08/1998)   Hepatitis B Vaccines 19-59 Average Risk (1 of 3 - 19+ 3-dose series) 01/10/2026 (Originally 11/08/1998)   DTaP/Tdap/Td (  2 - Td or Tdap) 11/25/2026   Influenza Vaccine  Completed   HPV VACCINES (No Doses Required) Completed   Hepatitis C Screening   Completed   Meningococcal B Vaccine  Aged Out   HIV Screening  Discontinued   Anxiety/Depression:  ***  Hyperlipidemia:Not currently on pharmacologic treatment.  Lab Results  Component Value Date   CHOL 217 (H) 11/17/2023   HDL 49.70 11/17/2023   LDLCALC 142 (H) 11/17/2023   TRIG 126.0 11/17/2023   CHOLHDL 4 11/17/2023   Lab Results  Component Value Date   NA 139 11/17/2023   CL 103 11/17/2023   K 4.0 11/17/2023   CO2 28 11/17/2023   BUN 13 11/17/2023   CREATININE 0.73 11/17/2023   GFR 100.30 11/17/2023   CALCIUM 9.6 11/17/2023   ALBUMIN 4.2 11/17/2023   GLUCOSE 91 11/17/2023   Lab Results  Component Value Date   HGBA1C 4.8 11/09/2022   Asthma:  Currently on levalbuterol  45 mcg/act every 6 hours prn.***   Review of Systems  Constitutional:  Positive for fatigue. Negative for activity change, appetite change and fever.  HENT:  Negative for mouth sores, sore throat and trouble swallowing.   Eyes:  Negative for redness and visual disturbance.  Respiratory:  Negative for cough, shortness of breath and wheezing.   Cardiovascular:  Negative for chest pain and leg swelling.  Gastrointestinal:  Negative for abdominal pain, nausea and vomiting.  Endocrine: Negative for cold intolerance, heat intolerance, polydipsia, polyphagia and polyuria.  Genitourinary:  Negative for decreased urine volume, dysuria and hematuria.  Musculoskeletal:  Negative for gait problem and myalgias.  Skin:  Negative for color change and rash.  Allergic/Immunologic: Positive for environmental allergies.  Neurological:  Negative for syncope, weakness and headaches.  Hematological:  Negative for adenopathy. Does not bruise/bleed easily.  Psychiatric/Behavioral:  Negative for confusion and hallucinations.   All other systems reviewed and are negative.  Current Outpatient Medications on File Prior to Visit  Medication Sig Dispense Refill   albuterol  (VENTOLIN  HFA) 108 (90 Base) MCG/ACT inhaler INHALE  2 PUFFS INTO THE LUNGS EVERY 6 (SIX) HOURS AS NEEDED. FOR WHEEZE OR SHORTNESS OF BREATH 8.5 each 2   amitriptyline  (ELAVIL ) 50 MG tablet Take 1 tablet (50 mg total) by mouth at bedtime. 90 tablet 3   Cariprazine HCl (VRAYLAR) 0.5 MG CAPS Take by mouth.     cetirizine (ZYRTEC) 10 MG tablet Take 10 mg by mouth daily as needed.     clobetasol  ointment (TEMOVATE ) 0.05 % Apply 1 Application topically daily as needed. 30 g 2   clonazePAM  (KLONOPIN ) 0.5 MG tablet Take 1 tablet (0.5 mg total) by mouth 2 (two) times daily as needed for anxiety. 60 tablet 5   esomeprazole  (NEXIUM ) 40 MG capsule Take 1 capsule (40 mg total) by mouth daily. 90 capsule 3   benzonatate  (TESSALON ) 100 MG capsule Take 1-2 capsules (100-200 mg total) by mouth 3 (three) times daily as needed. (Patient not taking: Reported on 01/10/2025) 30 capsule 0   Dextromethorphan-buPROPion  ER (AUVELITY ) 45-105 MG TBCR Take 1 tablet by mouth 2 (two) times daily. (Patient not taking: Reported on 01/10/2025) 60 tablet 2   fluticasone (FLONASE) 50 MCG/ACT nasal spray Place into both nostrils daily as needed for allergies or rhinitis. (Patient not taking: Reported on 01/10/2025)     Lumateperone  Tosylate (CAPLYTA ) 21 MG CAPS Take 21 mg by mouth daily. (Patient not taking: Reported on 01/10/2025) 30 capsule 2   naproxen  (NAPROSYN ) 500 MG tablet Take 1  tablet (500 mg total) by mouth 2 (two) times daily with a meal. (Patient not taking: Reported on 01/10/2025) 30 tablet 0   oseltamivir  (TAMIFLU ) 75 MG capsule Take 1 capsule (75 mg total) by mouth 2 (two) times daily. (Patient not taking: Reported on 01/10/2025) 10 capsule 0   No current facility-administered medications on file prior to visit.    Past Medical History:  Diagnosis Date   Anxiety    Asthma    Constipation    Depression    Generalized headaches    GERD (gastroesophageal reflux disease)    IBS (irritable bowel syndrome)    Other and unspecified ovarian cysts    PONV (postoperative nausea  and vomiting)    Pulse irregularity    Rapid heart rate    Seasonal allergies     Past Surgical History:  Procedure Laterality Date   BREAST LUMPECTOMY WITH RADIOACTIVE SEED LOCALIZATION Right 11/18/2021   Procedure: RIGHT BREAST LUMPECTOMY WITH RADIOACTIVE SEED LOCALIZATION;  Surgeon: Belinda Cough, MD;  Location: [facility];  Service: General;  Laterality: Right;   DILATION AND CURETTAGE OF UTERUS  2005    Allergies  Allergen Reactions   Doxycycline Nausea And Vomiting    Other reaction(s): Vomiting    Family History  Problem Relation Age of Onset   Hypertension Mother    Hypertension Father    Anxiety disorder Father    Anxiety disorder Brother    Depression Brother    ADD / ADHD Son    Tourette syndrome Son    Depression Daughter    CAD Neg Hx     Social History   Socioeconomic History   Marital status: Married    Spouse name: Not on file   Number of children: 2   Years of education: Not on file   Highest education level: Associate degree: occupational, scientist, product/process development, or vocational program  Occupational History   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  Tobacco Use   Smoking status: Never   Smokeless tobacco: Never  Vaping Use   Vaping status: Never Used  Substance and Sexual Activity   Alcohol use: Yes    Alcohol/week: 1.0 standard drink of alcohol    Types: 1 Standard drinks or equivalent per week    Comment: occasionally   Drug use: No   Sexual activity: Not on file  Other Topics Concern   Not on file  Social History Narrative   Not on file   Social Drivers of Health   Tobacco Use: Low Risk (12/17/2024)   Patient History    Smoking Tobacco Use: Never    Smokeless Tobacco Use: Never    Passive Exposure: Not on file  Financial Resource Strain: Low Risk (07/01/2022)   Overall Financial Resource Strain (CARDIA)    Difficulty of Paying Living Expenses: Not hard at all  Food Insecurity: No Food Insecurity (07/01/2022)   Hunger Vital Sign    Worried  About Running Out of Food in the Last Year: Never true    Ran Out of Food in the Last Year: Never true  Transportation Needs: No Transportation Needs (07/01/2022)   PRAPARE - Administrator, Civil Service (Medical): No    Lack of Transportation (Non-Medical): No  Physical Activity: Insufficiently Active (07/01/2022)   Exercise Vital Sign    Days of Exercise per Week: 2 days    Minutes of Exercise per Session: 20 min  Stress: Stress Concern Present (07/01/2022)   [employer] of Occupational Health - Occupational  Stress Questionnaire    Feeling of Stress : Rather much  Social Connections: Moderately Isolated (07/01/2022)   Social Connection and Isolation Panel    Frequency of Communication with Friends and Family: More than three times a week    Frequency of Social Gatherings with Friends and Family: Twice a week    Attends Religious Services: Never    Database Administrator or Organizations: No    Attends Engineer, Structural: Not on file    Marital Status: Married  Depression (PHQ2-9): High Risk (01/10/2025)   Depression (PHQ2-9)    PHQ-2 Score: 11  Alcohol Screen: Low Risk (07/01/2022)   Alcohol Screen    Last Alcohol Screening Score (AUDIT): 3  Housing: Low Risk (07/01/2022)   Housing    Last Housing Risk Score: 0  Utilities: Not on file  Health Literacy: Not on file   Today's Vitals   01/10/25 1108  BP: 110/68  Pulse: 97  Resp: 16  Temp: 98.3 F (36.8 C)  SpO2: 98%  Weight: 131 lb 9.6 oz (59.7 kg)  Height: 5' 1 (1.549 m)   Body mass index is 24.87 kg/m.  Wt Readings from Last 3 Encounters:  01/10/25 131 lb 9.6 oz (59.7 kg)  12/17/24 130 lb (59 kg)  11/17/23 163 lb (73.9 kg)   Physical Exam Vitals and nursing note reviewed.  Constitutional:      General: She is not in acute distress.    Appearance: She is well-developed.  HENT:     Head: Normocephalic and atraumatic.     Right Ear: Tympanic membrane, ear canal and external ear normal.      Left Ear: Tympanic membrane, ear canal and external ear normal.     Mouth/Throat:     Mouth: Mucous membranes are moist.     Pharynx: Oropharynx is clear. Uvula midline.  Eyes:     Extraocular Movements: Extraocular movements intact.     Conjunctiva/sclera: Conjunctivae normal.     Pupils: Pupils are equal, round, and reactive to light.  Neck:     Thyroid : No thyromegaly.     Trachea: No tracheal deviation.  Cardiovascular:     Rate and Rhythm: Normal rate and regular rhythm.     Pulses:          Dorsalis pedis pulses are 2+ on the right side and 2+ on the left side.     Heart sounds: No murmur heard. Pulmonary:     Effort: Pulmonary effort is normal. No respiratory distress.     Breath sounds: Normal breath sounds.  Abdominal:     Palpations: Abdomen is soft. There is no hepatomegaly or mass.     Tenderness: There is no abdominal tenderness.  Genitourinary:    Comments: Deferred to gyn. Musculoskeletal:     Right lower leg: No edema.     Left lower leg: No edema.     Comments: No major deformity or signs of synovitis appreciated.  Lymphadenopathy:     Cervical: No cervical adenopathy.     Upper Body:     Right upper body: No supraclavicular adenopathy.     Left upper body: No supraclavicular adenopathy.  Skin:    General: Skin is warm.     Findings: No erythema or rash.  Neurological:     General: No focal deficit present.     Mental Status: She is alert and oriented to person, place, and time.     Cranial Nerves: No cranial nerve deficit.  Motor: No weakness.     Coordination: Coordination normal.     Gait: Gait normal.     Deep Tendon Reflexes:     Reflex Scores:      Bicep reflexes are 2+ on the right side and 2+ on the left side.      Patellar reflexes are 2+ on the right side and 2+ on the left side. Psychiatric:     Comments: Well groomed, good eye contact.   ASSESSMENT AND PLAN:  [name] was here today for her annual physical  examination.  No orders of the defined types were placed in this encounter.   Routine general medical examination at a health care facility  Hyperlipidemia, unspecified hyperlipidemia type  Colon cancer screening  Screening for endocrine, metabolic and immunity disorder  Return in 1 year (on 01/10/2026).   Robertt Buda G. Annalena Piatt, MD  [facility]. [place] office. "

## 2025-01-11 ENCOUNTER — Ambulatory Visit: Payer: Self-pay | Admitting: Family Medicine

## 2025-01-11 NOTE — Assessment & Plan Note (Signed)
 Currently on nonpharmacologic treatment. Further recommendations will be given according to 10 years CVD risk score and lipid panel numbers.

## 2025-01-11 NOTE — Assessment & Plan Note (Signed)
 Problem is well controlled. Continue Nexium  40 mg daily and GERD precautions.

## 2025-01-11 NOTE — Assessment & Plan Note (Signed)
We discussed the importance of regular physical activity and healthy diet for prevention of chronic illness and/or complications. Preventive guidelines reviewed. Vaccination up-to-date. Continue her female preventive care with her gynecologist. Next CPE in a year.

## 2025-01-11 NOTE — Assessment & Plan Note (Signed)
 Problem is well controlled. Continue Albuterol  inh 1-2 puff qid prn.
